# Patient Record
Sex: Male | Born: 1935
Health system: Southern US, Community
[De-identification: ages and names within clinical notes are randomized; demographics above are authoritative.]

## PROBLEM LIST (undated history)

## (undated) DIAGNOSIS — Z9889 Other specified postprocedural states: Secondary | ICD-10-CM

## (undated) DIAGNOSIS — N200 Calculus of kidney: Secondary | ICD-10-CM

## (undated) DIAGNOSIS — E785 Hyperlipidemia, unspecified: Secondary | ICD-10-CM

## (undated) DIAGNOSIS — L120 Bullous pemphigoid: Secondary | ICD-10-CM

## (undated) DIAGNOSIS — I10 Essential (primary) hypertension: Secondary | ICD-10-CM

## (undated) DIAGNOSIS — I255 Ischemic cardiomyopathy: Secondary | ICD-10-CM

## (undated) DIAGNOSIS — C801 Malignant (primary) neoplasm, unspecified: Secondary | ICD-10-CM

## (undated) DIAGNOSIS — I2510 Atherosclerotic heart disease of native coronary artery without angina pectoris: Secondary | ICD-10-CM

## (undated) HISTORY — DX: Ischemic cardiomyopathy: I25.5

## (undated) HISTORY — DX: Bullous pemphigoid: L12.0

## (undated) HISTORY — DX: Atherosclerotic heart disease of native coronary artery without angina pectoris: I25.10

## (undated) HISTORY — DX: Other specified postprocedural states: Z98.890

## (undated) HISTORY — PX: OTHER SURGICAL HISTORY: SHX169

## (undated) HISTORY — PX: EP IMPLANTABLE DEVICE: SHX172B

## (undated) HISTORY — DX: Hyperlipidemia, unspecified: E78.5

## (undated) HISTORY — DX: Calculus of kidney: N20.0

## (undated) HISTORY — DX: Essential (primary) hypertension: I10

## (undated) HISTORY — DX: Malignant (primary) neoplasm, unspecified: C80.1

---

## 1968-12-15 HISTORY — PX: OTHER SURGICAL HISTORY: SHX169

## 1978-12-15 HISTORY — PX: HERNIA REPAIR: SHX51

## 1992-12-15 HISTORY — PX: OTHER SURGICAL HISTORY: SHX169

## 2004-12-15 HISTORY — PX: OTHER SURGICAL HISTORY: SHX169

## 2011-01-12 ENCOUNTER — Emergency Department: Payer: Self-pay | Admitting: Emergency Medicine

## 2011-05-19 ENCOUNTER — Ambulatory Visit: Payer: Self-pay

## 2014-12-12 LAB — LIPID PANEL
CHOLESTEROL: 162 mg/dL (ref 0–200)
HDL: 51 mg/dL (ref 35–70)
LDL Cholesterol: 95 mg/dL
Triglycerides: 80 mg/dL (ref 40–160)

## 2015-01-12 DIAGNOSIS — R945 Abnormal results of liver function studies: Secondary | ICD-10-CM | POA: Diagnosis not present

## 2015-01-12 LAB — HEPATIC FUNCTION PANEL
ALK PHOS: 93 U/L (ref 25–125)
ALT: 63 U/L — AB (ref 10–40)
AST: 29 U/L (ref 14–40)
Bilirubin, Total: 0.4 mg/dL

## 2015-02-01 DIAGNOSIS — R350 Frequency of micturition: Secondary | ICD-10-CM | POA: Diagnosis not present

## 2015-02-01 DIAGNOSIS — R319 Hematuria, unspecified: Secondary | ICD-10-CM | POA: Diagnosis not present

## 2015-02-01 LAB — BASIC METABOLIC PANEL
BUN: 13 mg/dL (ref 4–21)
Creatinine: 1 mg/dL (ref 0.6–1.3)
POTASSIUM: 5.2 mmol/L (ref 3.4–5.3)
Sodium: 142 mmol/L (ref 137–147)

## 2015-02-01 LAB — CBC AND DIFFERENTIAL
HEMATOCRIT: 45 % (ref 41–53)
Hemoglobin: 15.2 g/dL (ref 13.5–17.5)
NEUTROS ABS: 65 /uL
Platelets: 275 10*3/uL (ref 150–399)
WBC: 10.1 10^3/mL

## 2015-02-01 LAB — PSA: PSA: 2.8

## 2015-02-07 ENCOUNTER — Ambulatory Visit: Payer: Self-pay | Admitting: Family Medicine

## 2015-02-07 DIAGNOSIS — N200 Calculus of kidney: Secondary | ICD-10-CM | POA: Diagnosis not present

## 2015-02-07 DIAGNOSIS — N329 Bladder disorder, unspecified: Secondary | ICD-10-CM | POA: Diagnosis not present

## 2015-02-07 DIAGNOSIS — R310 Gross hematuria: Secondary | ICD-10-CM | POA: Diagnosis not present

## 2015-02-07 DIAGNOSIS — N400 Enlarged prostate without lower urinary tract symptoms: Secondary | ICD-10-CM | POA: Diagnosis not present

## 2015-02-23 DIAGNOSIS — L409 Psoriasis, unspecified: Secondary | ICD-10-CM | POA: Diagnosis not present

## 2015-02-23 DIAGNOSIS — R350 Frequency of micturition: Secondary | ICD-10-CM | POA: Diagnosis not present

## 2015-03-06 DIAGNOSIS — R319 Hematuria, unspecified: Secondary | ICD-10-CM | POA: Diagnosis not present

## 2015-03-16 ENCOUNTER — Ambulatory Visit
Admit: 2015-03-16 | Disposition: A | Discharge status: Home / Self Care | Payer: Self-pay | Attending: Urology | Admitting: Urology

## 2015-03-16 DIAGNOSIS — N200 Calculus of kidney: Secondary | ICD-10-CM | POA: Diagnosis not present

## 2015-03-16 DIAGNOSIS — R319 Hematuria, unspecified: Secondary | ICD-10-CM | POA: Diagnosis not present

## 2015-03-16 DIAGNOSIS — K5730 Diverticulosis of large intestine without perforation or abscess without bleeding: Secondary | ICD-10-CM | POA: Diagnosis not present

## 2015-03-16 DIAGNOSIS — K5790 Diverticulosis of intestine, part unspecified, without perforation or abscess without bleeding: Secondary | ICD-10-CM | POA: Diagnosis not present

## 2015-03-19 DIAGNOSIS — R310 Gross hematuria: Secondary | ICD-10-CM | POA: Diagnosis not present

## 2015-03-19 DIAGNOSIS — N3289 Other specified disorders of bladder: Secondary | ICD-10-CM | POA: Diagnosis not present

## 2015-03-19 DIAGNOSIS — R319 Hematuria, unspecified: Secondary | ICD-10-CM | POA: Diagnosis not present

## 2015-03-19 DIAGNOSIS — N200 Calculus of kidney: Secondary | ICD-10-CM | POA: Diagnosis not present

## 2015-03-27 ENCOUNTER — Ambulatory Visit
Admit: 2015-03-27 | Disposition: A | Discharge status: Home / Self Care | Payer: Self-pay | Attending: Urology | Admitting: Urology

## 2015-03-27 DIAGNOSIS — I10 Essential (primary) hypertension: Secondary | ICD-10-CM | POA: Insufficient documentation

## 2015-03-27 DIAGNOSIS — R0681 Apnea, not elsewhere classified: Secondary | ICD-10-CM | POA: Insufficient documentation

## 2015-03-27 DIAGNOSIS — R0602 Shortness of breath: Secondary | ICD-10-CM | POA: Diagnosis not present

## 2015-03-27 DIAGNOSIS — E782 Mixed hyperlipidemia: Secondary | ICD-10-CM | POA: Diagnosis not present

## 2015-03-27 DIAGNOSIS — Z01812 Encounter for preprocedural laboratory examination: Secondary | ICD-10-CM | POA: Diagnosis not present

## 2015-03-27 DIAGNOSIS — Z01810 Encounter for preprocedural cardiovascular examination: Secondary | ICD-10-CM | POA: Diagnosis not present

## 2015-03-27 DIAGNOSIS — Z955 Presence of coronary angioplasty implant and graft: Secondary | ICD-10-CM | POA: Diagnosis not present

## 2015-03-27 DIAGNOSIS — N3289 Other specified disorders of bladder: Secondary | ICD-10-CM | POA: Diagnosis not present

## 2015-03-27 DIAGNOSIS — I25119 Atherosclerotic heart disease of native coronary artery with unspecified angina pectoris: Secondary | ICD-10-CM | POA: Diagnosis not present

## 2015-03-27 LAB — CBC
HCT: 46.3 % (ref 40.0–52.0)
HGB: 15 g/dL (ref 13.0–18.0)
MCH: 28 pg (ref 26.0–34.0)
MCHC: 32.3 g/dL (ref 32.0–36.0)
MCV: 87 fL (ref 80–100)
Platelet: 243 10*3/uL (ref 150–440)
RBC: 5.35 10*6/uL (ref 4.40–5.90)
RDW: 14.2 % (ref 11.5–14.5)
WBC: 10 10*3/uL (ref 3.8–10.6)

## 2015-03-27 LAB — BASIC METABOLIC PANEL
Anion Gap: 5 — ABNORMAL LOW (ref 7–16)
BUN: 19 mg/dL
CALCIUM: 10.1 mg/dL
CHLORIDE: 108 mmol/L
Co2: 28 mmol/L
Creatinine: 1.12 mg/dL
Glucose: 118 mg/dL — ABNORMAL HIGH
POTASSIUM: 4 mmol/L
Sodium: 141 mmol/L

## 2015-04-05 DIAGNOSIS — I25119 Atherosclerotic heart disease of native coronary artery with unspecified angina pectoris: Secondary | ICD-10-CM | POA: Diagnosis not present

## 2015-04-05 DIAGNOSIS — I2510 Atherosclerotic heart disease of native coronary artery without angina pectoris: Secondary | ICD-10-CM | POA: Diagnosis not present

## 2015-04-05 DIAGNOSIS — E782 Mixed hyperlipidemia: Secondary | ICD-10-CM | POA: Diagnosis not present

## 2015-04-05 DIAGNOSIS — I10 Essential (primary) hypertension: Secondary | ICD-10-CM | POA: Diagnosis not present

## 2015-04-05 DIAGNOSIS — R0602 Shortness of breath: Secondary | ICD-10-CM | POA: Diagnosis not present

## 2015-04-09 ENCOUNTER — Ambulatory Visit
Admit: 2015-04-09 | Disposition: A | Discharge status: Home / Self Care | Payer: Self-pay | Attending: Urology | Admitting: Urology

## 2015-04-09 DIAGNOSIS — I2510 Atherosclerotic heart disease of native coronary artery without angina pectoris: Secondary | ICD-10-CM | POA: Diagnosis not present

## 2015-04-09 DIAGNOSIS — F17200 Nicotine dependence, unspecified, uncomplicated: Secondary | ICD-10-CM | POA: Diagnosis not present

## 2015-04-09 DIAGNOSIS — K30 Functional dyspepsia: Secondary | ICD-10-CM | POA: Diagnosis not present

## 2015-04-09 DIAGNOSIS — E785 Hyperlipidemia, unspecified: Secondary | ICD-10-CM | POA: Diagnosis not present

## 2015-04-09 DIAGNOSIS — C679 Malignant neoplasm of bladder, unspecified: Secondary | ICD-10-CM | POA: Diagnosis not present

## 2015-04-09 DIAGNOSIS — Z87442 Personal history of urinary calculi: Secondary | ICD-10-CM | POA: Diagnosis not present

## 2015-04-09 DIAGNOSIS — I10 Essential (primary) hypertension: Secondary | ICD-10-CM | POA: Diagnosis not present

## 2015-04-09 DIAGNOSIS — C675 Malignant neoplasm of bladder neck: Secondary | ICD-10-CM | POA: Diagnosis not present

## 2015-04-09 DIAGNOSIS — Z955 Presence of coronary angioplasty implant and graft: Secondary | ICD-10-CM | POA: Diagnosis not present

## 2015-04-09 DIAGNOSIS — C672 Malignant neoplasm of lateral wall of bladder: Secondary | ICD-10-CM | POA: Diagnosis not present

## 2015-04-12 LAB — SURGICAL PATHOLOGY

## 2015-04-15 NOTE — Op Note (Signed)
PATIENT NAME:  Terry [name], Terry [name] MR#:  [id number] DATE OF BIRTH:  1936/01/30  DATE OF PROCEDURE:  04/09/2015  PREOPERATIVE DIAGNOSIS: Bladder tumor.  POSTOPERATIVE DIAGNOSIS: Bladder tumor, medium-sized tumor, 2 to 2.5 cm.   PROCEDURE: Cystoscopy with resection of 2 medium-sized bladder tumors.   SURGEON: Rick Duff, MD  ANESTHESIA: General.   SPECIMENS SENT: Yes.  DESCRIPTION OF PROCEDURE: With the patient sterilely prepped and draped in the supine lithotomy position, after an appropriate timeout, I view the bladder with the cystoscope. The tumor is too large to take out with biopsy forceps so I have to resect them with a saline resectoscope. So with normal saline irrigation, using bipolar saline resectoscope, I resect the bladder tumors. The one on the right lateral wall is the larger of the two and there is some obturator spasm with penetration through the muscle into the fat. It is resected easily. Bleeding is controlled easily with cautery. Then, there is 1 at the right side of the bladder neck, just in front of the ureteral orifice. This is resected easily. They appear to be superficial, although the resection goes to the muscle. He tolerates it well and once the fragments are evacuated with suction evacuation, and there is no more bleeding, I put a Foley in because of the perforation of bladder wall with the obturator spasm. So I then irrigate and it is clear urine completely throughout the irrigation. Foley will be placed to gravity. He will go home with a bag to gravity. The rectal exam is done at the end of the procedure after I place a B and O suppository in the rectum. There is no fixation of the pelvis and the prostate is small on rectal exam. Internally, the prostate had trilobar hypertrophy, but not obstructive in nature. There was minimal trabeculation in the bladder. The ureters were in normal position. Then 30 mL of 0.5% plain Marcaine is in the bladder at the end of the procedure to  help with the postoperative bladder spasms.  ____________________________ Janice Coffin. Elnoria Howard, DO rdh:sb D: 04/09/2015 10:49:10 ET T: 04/09/2015 11:20:35 ET JOB#: [id number]  cc: Janice Coffin. Elnoria Howard, DO, <Dictator> Tenelle Andreason D Arieon Corcoran DO ELECTRONICALLY SIGNED 04/09/2015 14:23

## 2015-04-24 DIAGNOSIS — I2510 Atherosclerotic heart disease of native coronary artery without angina pectoris: Secondary | ICD-10-CM | POA: Insufficient documentation

## 2015-04-24 DIAGNOSIS — K259 Gastric ulcer, unspecified as acute or chronic, without hemorrhage or perforation: Secondary | ICD-10-CM | POA: Insufficient documentation

## 2015-04-24 DIAGNOSIS — IMO0001 Reserved for inherently not codable concepts without codable children: Secondary | ICD-10-CM | POA: Insufficient documentation

## 2015-04-24 DIAGNOSIS — M79609 Pain in unspecified limb: Secondary | ICD-10-CM | POA: Insufficient documentation

## 2015-04-24 DIAGNOSIS — R945 Abnormal results of liver function studies: Secondary | ICD-10-CM | POA: Insufficient documentation

## 2015-04-24 DIAGNOSIS — N200 Calculus of kidney: Secondary | ICD-10-CM | POA: Insufficient documentation

## 2015-04-24 DIAGNOSIS — R030 Elevated blood-pressure reading, without diagnosis of hypertension: Secondary | ICD-10-CM

## 2015-04-24 DIAGNOSIS — I25110 Atherosclerotic heart disease of native coronary artery with unstable angina pectoris: Secondary | ICD-10-CM | POA: Insufficient documentation

## 2015-04-24 DIAGNOSIS — R7989 Other specified abnormal findings of blood chemistry: Secondary | ICD-10-CM | POA: Insufficient documentation

## 2015-04-24 DIAGNOSIS — L409 Psoriasis, unspecified: Secondary | ICD-10-CM | POA: Insufficient documentation

## 2015-04-24 DIAGNOSIS — E7800 Pure hypercholesterolemia, unspecified: Secondary | ICD-10-CM | POA: Insufficient documentation

## 2015-04-24 DIAGNOSIS — E785 Hyperlipidemia, unspecified: Secondary | ICD-10-CM | POA: Insufficient documentation

## 2015-04-26 DIAGNOSIS — C679 Malignant neoplasm of bladder, unspecified: Secondary | ICD-10-CM | POA: Diagnosis not present

## 2015-04-26 DIAGNOSIS — C672 Malignant neoplasm of lateral wall of bladder: Secondary | ICD-10-CM | POA: Diagnosis not present

## 2015-04-27 DIAGNOSIS — C679 Malignant neoplasm of bladder, unspecified: Secondary | ICD-10-CM | POA: Diagnosis not present

## 2015-05-04 DIAGNOSIS — C672 Malignant neoplasm of lateral wall of bladder: Secondary | ICD-10-CM | POA: Diagnosis not present

## 2015-05-14 ENCOUNTER — Inpatient Hospital Stay: Admission: RE | Admit: 2015-05-14 | Payer: Self-pay | Source: Ambulatory Visit

## 2015-05-15 ENCOUNTER — Inpatient Hospital Stay: Admission: RE | Admit: 2015-05-15 | Payer: Self-pay | Source: Ambulatory Visit

## 2015-05-17 DIAGNOSIS — C801 Malignant (primary) neoplasm, unspecified: Secondary | ICD-10-CM | POA: Diagnosis not present

## 2015-05-17 DIAGNOSIS — R310 Gross hematuria: Secondary | ICD-10-CM | POA: Diagnosis not present

## 2015-05-17 DIAGNOSIS — Z882 Allergy status to sulfonamides status: Secondary | ICD-10-CM | POA: Diagnosis not present

## 2015-05-17 DIAGNOSIS — I10 Essential (primary) hypertension: Secondary | ICD-10-CM | POA: Diagnosis not present

## 2015-05-17 DIAGNOSIS — Z955 Presence of coronary angioplasty implant and graft: Secondary | ICD-10-CM | POA: Diagnosis not present

## 2015-05-17 DIAGNOSIS — R0602 Shortness of breath: Secondary | ICD-10-CM | POA: Diagnosis not present

## 2015-05-17 DIAGNOSIS — K219 Gastro-esophageal reflux disease without esophagitis: Secondary | ICD-10-CM | POA: Diagnosis not present

## 2015-05-17 DIAGNOSIS — C675 Malignant neoplasm of bladder neck: Secondary | ICD-10-CM | POA: Diagnosis not present

## 2015-05-17 DIAGNOSIS — C679 Malignant neoplasm of bladder, unspecified: Secondary | ICD-10-CM | POA: Diagnosis not present

## 2015-05-17 DIAGNOSIS — I2510 Atherosclerotic heart disease of native coronary artery without angina pectoris: Secondary | ICD-10-CM | POA: Diagnosis not present

## 2015-05-21 ENCOUNTER — Ambulatory Visit: Admission: RE | Admit: 2015-05-21 | Payer: Self-pay | Source: Ambulatory Visit | Admitting: Urology

## 2015-05-21 ENCOUNTER — Encounter: Admission: RE | Payer: Self-pay | Source: Ambulatory Visit

## 2015-05-21 SURGERY — TRANSURETHRAL RESECTION OF BLADDER TUMOR WITH MITOMYCIN-C
Anesthesia: Choice

## 2015-06-15 HISTORY — PX: BLADDER SURGERY: SHX569

## 2015-06-19 DIAGNOSIS — M47819 Spondylosis without myelopathy or radiculopathy, site unspecified: Secondary | ICD-10-CM | POA: Diagnosis not present

## 2015-06-19 DIAGNOSIS — Z0183 Encounter for blood typing: Secondary | ICD-10-CM | POA: Diagnosis not present

## 2015-06-19 DIAGNOSIS — N200 Calculus of kidney: Secondary | ICD-10-CM | POA: Diagnosis not present

## 2015-06-19 DIAGNOSIS — K5790 Diverticulosis of intestine, part unspecified, without perforation or abscess without bleeding: Secondary | ICD-10-CM | POA: Diagnosis not present

## 2015-06-19 DIAGNOSIS — Z01818 Encounter for other preprocedural examination: Secondary | ICD-10-CM | POA: Diagnosis not present

## 2015-06-19 DIAGNOSIS — K5730 Diverticulosis of large intestine without perforation or abscess without bleeding: Secondary | ICD-10-CM | POA: Diagnosis not present

## 2015-06-19 DIAGNOSIS — I10 Essential (primary) hypertension: Secondary | ICD-10-CM | POA: Diagnosis not present

## 2015-06-19 DIAGNOSIS — I2510 Atherosclerotic heart disease of native coronary artery without angina pectoris: Secondary | ICD-10-CM | POA: Diagnosis not present

## 2015-06-19 DIAGNOSIS — R1900 Intra-abdominal and pelvic swelling, mass and lump, unspecified site: Secondary | ICD-10-CM | POA: Diagnosis not present

## 2015-06-19 DIAGNOSIS — C679 Malignant neoplasm of bladder, unspecified: Secondary | ICD-10-CM | POA: Diagnosis not present

## 2015-06-21 ENCOUNTER — Encounter: Payer: Self-pay | Admitting: Family Medicine

## 2015-06-21 ENCOUNTER — Ambulatory Visit (INDEPENDENT_AMBULATORY_CARE_PROVIDER_SITE_OTHER): Payer: Commercial Managed Care - HMO | Admitting: Family Medicine

## 2015-06-21 VITALS — BP 138/72 | HR 70 | Temp 98.2°F | Resp 16 | Ht 67.0 in | Wt 151.2 lb

## 2015-06-21 DIAGNOSIS — C679 Malignant neoplasm of bladder, unspecified: Secondary | ICD-10-CM | POA: Insufficient documentation

## 2015-06-21 DIAGNOSIS — Z0000 Encounter for general adult medical examination without abnormal findings: Secondary | ICD-10-CM

## 2015-06-21 DIAGNOSIS — E785 Hyperlipidemia, unspecified: Secondary | ICD-10-CM | POA: Diagnosis not present

## 2015-06-21 LAB — POCT URINALYSIS DIPSTICK
Bilirubin, UA: NEGATIVE
Glucose, UA: NEGATIVE
Ketones, UA: NEGATIVE
LEUKOCYTES UA: NEGATIVE
NITRITE UA: NEGATIVE
PH UA: 7.5
PROTEIN UA: NEGATIVE
Spec Grav, UA: 1.01
Urobilinogen, UA: 0.2

## 2015-06-21 MED ORDER — PRAVASTATIN SODIUM 20 MG PO TABS: 20.0000 mg | ORAL_TABLET | Freq: Every day | ORAL | Status: DC

## 2015-06-21 NOTE — Progress Notes (Signed)
Patient: Terry [name], Male    DOB: May 29, 1936, 79 y.o.   MRN: [id number] Visit Date: 06/21/2015  Today's Provider: [name], PA   Chief Complaint  Patient presents with  . Annual Exam   Subjective:    Annual wellness visit Ceylon Arenson is a 79 y.o. male who presents today for his Subsequent Annual Wellness Visit. He feels fairly well despite diagnosis of high grade T1 papillary urothelial carcinoma of the bladder recently. Plans to have bladder and prostate removed 07-03-15 in [facility]. He reports exercising by playing softball frequently.Marland Kitchen He reports he is sleeping fairly well (sleep 5-6 hours a night and rarely use Tylenol-PM).  -----------------------------------------------------------   Review of Systems  Constitutional: Negative.   HENT: Negative.   Eyes: Negative.   Respiratory: Negative.   Cardiovascular: Negative.   Gastrointestinal: Negative.   Endocrine: Negative.   Genitourinary: Negative.   Neurological: Negative.     History   Social History  . Marital Status: Married    Spouse Name: N/A  . Number of Children: N/A  . Years of Education: N/A   Occupational History  . Not on file.   Social History Main Topics  . Smoking status: Former Smoker -- 1.50 packs/day for 50 years    Types: Cigarettes    Quit date: 06/18/2005  . Smokeless tobacco: Not on file  . Alcohol Use: No  . Drug Use: No  . Sexual Activity: Not on file   Other Topics Concern  . Not on file   Social History Narrative    Patient Active Problem List   Diagnosis Date Noted  . Cancer of lateral wall of urinary bladder 04/26/2015  . Arteriosclerosis of coronary artery 04/24/2015  . Blood pressure elevated 04/24/2015  . HLD (hyperlipidemia) 04/24/2015  . Calculus of kidney 04/24/2015  . Psoriasis 04/24/2015  . Gastric ulcer 04/24/2015  . Pain in soft tissues of limb 04/24/2015  . Abnormal LFTs 04/24/2015  . Benign essential HTN 03/27/2015  .  Combined fat and carbohydrate induced hyperlipemia 03/27/2015  . Breathlessness on exertion 03/27/2015    Past Surgical History  Procedure Laterality Date  . Hernia repair  1980  . Stomach ulcer surgery  1994  . Deviated nose septum surgery  1970  . Vascular stent  2006    His family history includes Alzheimer's disease in his father; Diabetes in his mother; Heart disease in his mother; Hip fracture in his father; Hypertension in his father; Prostate cancer in his paternal uncle.    Patient Active Problem List   Diagnosis Date Noted  . Cancer of lateral wall of urinary bladder 04/26/2015  . Arteriosclerosis of coronary artery 04/24/2015  . Blood pressure elevated 04/24/2015  . HLD (hyperlipidemia) 04/24/2015  . Calculus of kidney 04/24/2015  . Psoriasis 04/24/2015  . Gastric ulcer 04/24/2015  . Pain in soft tissues of limb 04/24/2015  . Abnormal LFTs 04/24/2015  . Benign essential HTN 03/27/2015  . Combined fat and carbohydrate induced hyperlipemia 03/27/2015  . Breathlessness on exertion 03/27/2015   Previous Medications   ASPIRIN 81 MG TABLET    Take 1 tablet by mouth daily.   PRAVASTATIN (PRAVACHOL) 40 MG TABLET    Take 1 tablet by mouth daily.   Allergies  Allergen Reactions  . Sudafed Pe Cold-Cough  [Phenylephrine-Dm-Gg-Apap] Hives  . Actifed Cold-Allergy  [Chlorpheniramine-Phenylephrine] Rash   Patient Care Team: Margo Common, PA as PCP - General (Physician Assistant)     Objective:  Vitals: BP 138/72 mmHg  Pulse 70  Temp(Src) 98.2 F (36.8 C) (Oral)  Resp 16  Ht 5\' 7"  (1.702 m)  Wt 151 lb 3.2 oz (68.584 kg)  BMI 23.68 kg/m2  Physical Exam  Constitutional: He appears well-developed. He appears distressed.  HENT:  Head: Normocephalic and atraumatic.  Right Ear: External ear normal.  Left Ear: External ear normal.  Nose: Nose normal.  Mouth/Throat: Oropharynx is clear and moist.  Eyes: Conjunctivae and EOM are normal. Pupils are equal, round, and  reactive to light.  Neck: Normal range of motion.  Cardiovascular: Normal rate, regular rhythm, normal heart sounds and intact distal pulses.   Pulmonary/Chest: Effort normal and breath sounds normal.  Abdominal: Soft. Bowel sounds are normal.  Genitourinary:  Exam deferred to urologist planning surgery of bladder and prostate removal 07-03-15.  Musculoskeletal: Normal range of motion.  Neurological: He is alert. He has normal reflexes.  Skin: Skin is warm and dry.  Psychiatric: He has a normal mood and affect. His behavior is normal. Thought content normal.    Activities of Daily Living In your present state of health, do you have any difficulty performing the following activities: 06/21/2015  Hearing? Y  Vision? N  Difficulty concentrating or making decisions? N  Walking or climbing stairs? N  Dressing or bathing? N  Doing errands, shopping? N    Fall Risk Assessment Fall Risk  06/21/2015  Falls in the past year? No     Depression Screen PHQ 2/9 Scores 06/21/2015  PHQ - 2 Score 0    Cognitive Testing - 6-CIT  Correct? Score   What year is it? yes 0 0 or 4  What month is it? yes 0 0 or 3  Memorize:    Pia Mau,  44,  [address]  [place],      What time is it? (within 1 hour) yes 0 0 or 3  Count backwards from 20 yes 0 0, 2, or 4  Name the months of the year yes 0 0, 2, or 4  Repeat name & address above yes 0 0, 2, 4, 6, 8, or 10       TOTAL SCORE  0/28   Interpretation:  Normal  Normal (0-7) Abnormal (8-28)       Assessment & Plan:     Annual Wellness Visit  Reviewed patient's Family Medical History Reviewed and updated list of patient's medical providers Assessment of cognitive impairment was done Assessed patient's functional ability Established a written schedule for health screening [place] Completed and Reviewed  Exercise Activities and Dietary recommendations Goals    None      Immunization History  Administered Date(s)  Administered  . Tdap 02/01/2013    Health Maintenance  Topic Date Due  . COLONOSCOPY  07/30/1988  . ZOSTAVAX  07/30/1998  . PNA vac Low Risk Adult (1 of 2 - PCV13) 07/31/2003  . INFLUENZA VACCINE  07/16/2015  . TETANUS/TDAP  02/01/2023      Discussed health benefits of physical activity, and encouraged him to engage in regular exercise appropriate for his age and condition.    ------------------------------------------------------------------------------------------------------------  1. Medicare annual wellness visit, initial Good generally health with history of bladder cancer. Declines vaccinations and screening colonoscopy. Probably will be done through oncologist in relationship to bladder cancer and upcoming surgery.  2. HLD (hyperlipidemia) Tolerating Pravastatin without side effects. Continue low fat diet and recheck levels in 3 months. Continue present pravastatin dosage. - pravastatin (PRAVACHOL)  20 MG tablet; Take 1 tablet (20 mg total) by mouth daily.  Dispense: 90 tablet; Refill: 3  3. Malignant neoplasm of urinary bladder, unspecified site Recent diagnosis with plans for bladder and prostate removal and development of a reconstructed bladder from a portion of intestines. Some blood in urine under microscopic exam. - POCT urinalysis dipstick

## 2015-07-03 DIAGNOSIS — K560 Paralytic ileus: Secondary | ICD-10-CM | POA: Diagnosis not present

## 2015-07-03 DIAGNOSIS — Z452 Encounter for adjustment and management of vascular access device: Secondary | ICD-10-CM | POA: Diagnosis not present

## 2015-07-03 DIAGNOSIS — N132 Hydronephrosis with renal and ureteral calculous obstruction: Secondary | ICD-10-CM | POA: Diagnosis not present

## 2015-07-03 DIAGNOSIS — R918 Other nonspecific abnormal finding of lung field: Secondary | ICD-10-CM | POA: Diagnosis not present

## 2015-07-03 DIAGNOSIS — R188 Other ascites: Secondary | ICD-10-CM | POA: Diagnosis not present

## 2015-07-03 DIAGNOSIS — J90 Pleural effusion, not elsewhere classified: Secondary | ICD-10-CM | POA: Diagnosis not present

## 2015-07-03 DIAGNOSIS — I10 Essential (primary) hypertension: Secondary | ICD-10-CM | POA: Diagnosis not present

## 2015-07-03 DIAGNOSIS — Z888 Allergy status to other drugs, medicaments and biological substances status: Secondary | ICD-10-CM | POA: Diagnosis not present

## 2015-07-03 DIAGNOSIS — K5730 Diverticulosis of large intestine without perforation or abscess without bleeding: Secondary | ICD-10-CM | POA: Diagnosis not present

## 2015-07-03 DIAGNOSIS — Z9889 Other specified postprocedural states: Secondary | ICD-10-CM | POA: Diagnosis not present

## 2015-07-03 DIAGNOSIS — K6389 Other specified diseases of intestine: Secondary | ICD-10-CM | POA: Diagnosis not present

## 2015-07-03 DIAGNOSIS — I2510 Atherosclerotic heart disease of native coronary artery without angina pectoris: Secondary | ICD-10-CM | POA: Diagnosis not present

## 2015-07-03 DIAGNOSIS — J984 Other disorders of lung: Secondary | ICD-10-CM | POA: Diagnosis not present

## 2015-07-03 DIAGNOSIS — Z4682 Encounter for fitting and adjustment of non-vascular catheter: Secondary | ICD-10-CM | POA: Diagnosis not present

## 2015-07-03 DIAGNOSIS — Z466 Encounter for fitting and adjustment of urinary device: Secondary | ICD-10-CM | POA: Diagnosis not present

## 2015-07-03 DIAGNOSIS — C679 Malignant neoplasm of bladder, unspecified: Secondary | ICD-10-CM | POA: Diagnosis not present

## 2015-07-03 DIAGNOSIS — R339 Retention of urine, unspecified: Secondary | ICD-10-CM | POA: Diagnosis not present

## 2015-07-03 DIAGNOSIS — N390 Urinary tract infection, site not specified: Secondary | ICD-10-CM | POA: Diagnosis not present

## 2015-07-03 DIAGNOSIS — K5660 Unspecified intestinal obstruction: Secondary | ICD-10-CM | POA: Diagnosis not present

## 2015-07-03 DIAGNOSIS — N1330 Unspecified hydronephrosis: Secondary | ICD-10-CM | POA: Diagnosis not present

## 2015-07-03 DIAGNOSIS — D72829 Elevated white blood cell count, unspecified: Secondary | ICD-10-CM | POA: Diagnosis not present

## 2015-07-03 DIAGNOSIS — K913 Postprocedural intestinal obstruction: Secondary | ICD-10-CM | POA: Diagnosis not present

## 2015-07-03 DIAGNOSIS — Z4889 Encounter for other specified surgical aftercare: Secondary | ICD-10-CM | POA: Diagnosis not present

## 2015-07-03 DIAGNOSIS — E785 Hyperlipidemia, unspecified: Secondary | ICD-10-CM | POA: Diagnosis not present

## 2015-07-03 DIAGNOSIS — N2889 Other specified disorders of kidney and ureter: Secondary | ICD-10-CM | POA: Diagnosis not present

## 2015-08-07 DIAGNOSIS — Z936 Other artificial openings of urinary tract status: Secondary | ICD-10-CM | POA: Insufficient documentation

## 2015-08-07 DIAGNOSIS — Z9889 Other specified postprocedural states: Secondary | ICD-10-CM | POA: Insufficient documentation

## 2015-08-15 DIAGNOSIS — C679 Malignant neoplasm of bladder, unspecified: Secondary | ICD-10-CM | POA: Diagnosis not present

## 2015-08-15 DIAGNOSIS — N202 Calculus of kidney with calculus of ureter: Secondary | ICD-10-CM | POA: Diagnosis not present

## 2015-08-15 DIAGNOSIS — Z8551 Personal history of malignant neoplasm of bladder: Secondary | ICD-10-CM | POA: Diagnosis not present

## 2015-08-15 DIAGNOSIS — Z955 Presence of coronary angioplasty implant and graft: Secondary | ICD-10-CM | POA: Diagnosis not present

## 2015-08-15 DIAGNOSIS — E785 Hyperlipidemia, unspecified: Secondary | ICD-10-CM | POA: Diagnosis not present

## 2015-08-15 DIAGNOSIS — N132 Hydronephrosis with renal and ureteral calculous obstruction: Secondary | ICD-10-CM | POA: Diagnosis not present

## 2015-08-15 DIAGNOSIS — Z936 Other artificial openings of urinary tract status: Secondary | ICD-10-CM | POA: Diagnosis not present

## 2015-08-15 DIAGNOSIS — I2510 Atherosclerotic heart disease of native coronary artery without angina pectoris: Secondary | ICD-10-CM | POA: Diagnosis not present

## 2015-08-15 DIAGNOSIS — I10 Essential (primary) hypertension: Secondary | ICD-10-CM | POA: Diagnosis not present

## 2015-08-15 DIAGNOSIS — Z7982 Long term (current) use of aspirin: Secondary | ICD-10-CM | POA: Diagnosis not present

## 2015-08-22 DIAGNOSIS — N200 Calculus of kidney: Secondary | ICD-10-CM | POA: Diagnosis not present

## 2015-08-22 DIAGNOSIS — C679 Malignant neoplasm of bladder, unspecified: Secondary | ICD-10-CM | POA: Diagnosis not present

## 2015-08-27 DIAGNOSIS — Z8551 Personal history of malignant neoplasm of bladder: Secondary | ICD-10-CM | POA: Diagnosis not present

## 2015-08-27 DIAGNOSIS — Z936 Other artificial openings of urinary tract status: Secondary | ICD-10-CM | POA: Diagnosis not present

## 2015-09-03 DIAGNOSIS — N2889 Other specified disorders of kidney and ureter: Secondary | ICD-10-CM | POA: Diagnosis not present

## 2015-09-03 DIAGNOSIS — T83028A Displacement of other indwelling urethral catheter, initial encounter: Secondary | ICD-10-CM | POA: Diagnosis not present

## 2015-09-03 DIAGNOSIS — Z9889 Other specified postprocedural states: Secondary | ICD-10-CM | POA: Diagnosis not present

## 2015-09-03 DIAGNOSIS — E785 Hyperlipidemia, unspecified: Secondary | ICD-10-CM | POA: Diagnosis not present

## 2015-09-03 DIAGNOSIS — I2510 Atherosclerotic heart disease of native coronary artery without angina pectoris: Secondary | ICD-10-CM | POA: Diagnosis not present

## 2015-09-03 DIAGNOSIS — Z87891 Personal history of nicotine dependence: Secondary | ICD-10-CM | POA: Diagnosis not present

## 2015-09-03 DIAGNOSIS — I10 Essential (primary) hypertension: Secondary | ICD-10-CM | POA: Diagnosis not present

## 2015-09-03 DIAGNOSIS — N99528 Other complication of other external stoma of urinary tract: Secondary | ICD-10-CM | POA: Diagnosis not present

## 2015-09-12 DIAGNOSIS — C679 Malignant neoplasm of bladder, unspecified: Secondary | ICD-10-CM | POA: Diagnosis not present

## 2015-09-12 DIAGNOSIS — N135 Crossing vessel and stricture of ureter without hydronephrosis: Secondary | ICD-10-CM | POA: Diagnosis not present

## 2015-09-12 DIAGNOSIS — L409 Psoriasis, unspecified: Secondary | ICD-10-CM | POA: Diagnosis not present

## 2015-09-12 DIAGNOSIS — N202 Calculus of kidney with calculus of ureter: Secondary | ICD-10-CM | POA: Diagnosis not present

## 2015-09-12 DIAGNOSIS — N201 Calculus of ureter: Secondary | ICD-10-CM | POA: Diagnosis not present

## 2015-09-12 DIAGNOSIS — Z955 Presence of coronary angioplasty implant and graft: Secondary | ICD-10-CM | POA: Diagnosis not present

## 2015-09-12 DIAGNOSIS — Z87891 Personal history of nicotine dependence: Secondary | ICD-10-CM | POA: Diagnosis not present

## 2015-09-12 DIAGNOSIS — I10 Essential (primary) hypertension: Secondary | ICD-10-CM | POA: Diagnosis not present

## 2015-09-12 DIAGNOSIS — N131 Hydronephrosis with ureteral stricture, not elsewhere classified: Secondary | ICD-10-CM | POA: Diagnosis not present

## 2015-09-12 DIAGNOSIS — N132 Hydronephrosis with renal and ureteral calculous obstruction: Secondary | ICD-10-CM | POA: Diagnosis not present

## 2015-09-12 DIAGNOSIS — E785 Hyperlipidemia, unspecified: Secondary | ICD-10-CM | POA: Diagnosis not present

## 2015-09-12 DIAGNOSIS — I2510 Atherosclerotic heart disease of native coronary artery without angina pectoris: Secondary | ICD-10-CM | POA: Diagnosis not present

## 2015-09-24 DIAGNOSIS — Z936 Other artificial openings of urinary tract status: Secondary | ICD-10-CM | POA: Diagnosis not present

## 2015-09-24 DIAGNOSIS — N135 Crossing vessel and stricture of ureter without hydronephrosis: Secondary | ICD-10-CM | POA: Diagnosis not present

## 2015-09-24 DIAGNOSIS — Z8551 Personal history of malignant neoplasm of bladder: Secondary | ICD-10-CM | POA: Diagnosis not present

## 2015-09-24 DIAGNOSIS — C672 Malignant neoplasm of lateral wall of bladder: Secondary | ICD-10-CM | POA: Diagnosis not present

## 2015-10-05 ENCOUNTER — Ambulatory Visit (INDEPENDENT_AMBULATORY_CARE_PROVIDER_SITE_OTHER): Payer: Commercial Managed Care - HMO | Admitting: Family Medicine

## 2015-10-05 ENCOUNTER — Encounter: Payer: Self-pay | Admitting: Family Medicine

## 2015-10-05 ENCOUNTER — Telehealth: Payer: Self-pay

## 2015-10-05 VITALS — BP 124/62 | HR 89 | Temp 98.3°F | Resp 16 | Wt 145.2 lb

## 2015-10-05 DIAGNOSIS — C672 Malignant neoplasm of lateral wall of bladder: Secondary | ICD-10-CM | POA: Diagnosis not present

## 2015-10-05 LAB — POCT URINALYSIS DIPSTICK
BILIRUBIN UA: NEGATIVE
Glucose, UA: NEGATIVE
KETONES UA: NEGATIVE
Nitrite, UA: NEGATIVE
PH UA: 6.5
SPEC GRAV UA: 1.015
Urobilinogen, UA: 0.2

## 2015-10-05 NOTE — Progress Notes (Signed)
Subjective:    Patient ID: Terry [name], male    DOB: 1936/05/19, 79 y.o.   MRN: [id number] Chief Complaint  Patient presents with  . Follow-up    Patient comes in office today for nurse visit. Patient states that he was called by our office to come in for urinanalysis. Patient reports he is scheduled for surgery next week to remove kidney stone.     HPI  This 79 year old male was hospitalized July-August 2016 for 30 days due to malignant neoplasm of the urinary bladder with total cystectomy and prostatectomy. Is S/P ileal conduit and planning more surgery for problems with ureter attachment. Dr. Garnett Farm [facility] Urologist with Dr. Clydene Laming) requested a pre-op urine culture today so the result could be available before surgery on 10-10-15.  Patient Active Problem List   Diagnosis Date Noted  . Bladder cancer ([place]) 06/21/2015  . Cancer of lateral wall of urinary bladder ([place]) 04/26/2015  . Arteriosclerosis of coronary artery 04/24/2015  . Blood pressure elevated 04/24/2015  . HLD (hyperlipidemia) 04/24/2015  . Calculus of kidney 04/24/2015  . Psoriasis 04/24/2015  . Gastric ulcer 04/24/2015  . Pain in soft tissues of limb 04/24/2015  . Abnormal LFTs 04/24/2015  . Benign essential HTN 03/27/2015  . Combined fat and carbohydrate induced hyperlipemia 03/27/2015  . Breathlessness on exertion 03/27/2015   Past Surgical History  Procedure Laterality Date  . Hernia repair  1980  . Stomach ulcer surgery  1994  . Deviated nose septum surgery  1970  . Vascular stent  2006   Family History  Problem Relation Age of Onset  . Diabetes Mother   . Heart disease Mother   . Hypertension Father   . Alzheimer's disease Father   . Hip fracture Father   . Prostate cancer Paternal Uncle    Social History  Substance Use Topics  . Smoking status: Former Smoker -- 1.50 packs/day for 50 years    Types: Cigarettes    Quit date: 06/18/2005  . Smokeless tobacco: None  . Alcohol Use: No    Allergies  Allergen Reactions  . Sudafed Pe Cold-Cough  [Phenylephrine-Dm-Gg-Apap] Hives  . Actifed Cold-Allergy  [Chlorpheniramine-Phenylephrine] Rash   Current Outpatient Prescriptions on File Prior to Visit  Medication Sig Dispense Refill  . pravastatin (PRAVACHOL) 20 MG tablet Take 1 tablet (20 mg total) by mouth daily. 90 tablet 3   No current facility-administered medications on file prior to visit.   Review of Systems  Constitutional: Negative.   HENT: Negative.   Respiratory: Negative.   Cardiovascular: Negative.   Genitourinary:       Clear urine in ostomy bag without blood. Empty bag 1-2 times a day.      Objective:   Physical Exam  Constitutional: He appears well-developed and well-nourished.  HENT:  Head: Normocephalic.  Eyes: Conjunctivae are normal.  Neck: Neck supple.  Cardiovascular: Normal rate and regular rhythm.   Abdominal: Bowel sounds are normal.  Genitourinary:  Ileal conduit ostomy in right lower quadrant of abdomen. Healthy tissue without signs of infection. Urine in collection bag is clear yellow. Some soreness across lower abdomen since surgery for bladder neoplasm, total cystectomy and prostatectomy.      Assessment & Plan:  1. Malignant neoplasm of lateral wall of urinary bladder ([place]) To have another ileal conduit procedure on 10-10-15 and urologist requested a pre-op urine culture. Results will be available next week prior to surgery. Dr. Juliane Poot will call back for report of culture. - POCT  urinalysis dipstick - Urine Culture

## 2015-10-05 NOTE — Telephone Encounter (Signed)
Dr. Flonnie Overman from [facility] calling and states patient has a ilial procedure coming up (exploratory operatomy?) and he needs patient to get a urine culture done. He wanted to see if that can be done today where patient comes by and gives urine for this, he said it will have to come from his ilial condoment (?) bag. He said call him if has questions cell number is [phone]

## 2015-10-08 LAB — URINE CULTURE

## 2015-10-10 DIAGNOSIS — N1330 Unspecified hydronephrosis: Secondary | ICD-10-CM | POA: Diagnosis not present

## 2015-10-10 DIAGNOSIS — K567 Ileus, unspecified: Secondary | ICD-10-CM | POA: Diagnosis not present

## 2015-10-10 DIAGNOSIS — C679 Malignant neoplasm of bladder, unspecified: Secondary | ICD-10-CM | POA: Diagnosis not present

## 2015-10-10 DIAGNOSIS — Z8551 Personal history of malignant neoplasm of bladder: Secondary | ICD-10-CM | POA: Diagnosis not present

## 2015-10-10 DIAGNOSIS — Z936 Other artificial openings of urinary tract status: Secondary | ICD-10-CM | POA: Diagnosis not present

## 2015-10-10 DIAGNOSIS — K9189 Other postprocedural complications and disorders of digestive system: Secondary | ICD-10-CM | POA: Diagnosis not present

## 2015-10-10 DIAGNOSIS — Z9889 Other specified postprocedural states: Secondary | ICD-10-CM | POA: Diagnosis not present

## 2015-10-10 DIAGNOSIS — Z9079 Acquired absence of other genital organ(s): Secondary | ICD-10-CM | POA: Diagnosis not present

## 2015-10-10 DIAGNOSIS — Z79899 Other long term (current) drug therapy: Secondary | ICD-10-CM | POA: Diagnosis not present

## 2015-10-10 DIAGNOSIS — N99528 Other complication of other external stoma of urinary tract: Secondary | ICD-10-CM | POA: Diagnosis not present

## 2015-10-10 DIAGNOSIS — R197 Diarrhea, unspecified: Secondary | ICD-10-CM | POA: Diagnosis not present

## 2015-10-10 DIAGNOSIS — Z7982 Long term (current) use of aspirin: Secondary | ICD-10-CM | POA: Diagnosis not present

## 2015-10-10 DIAGNOSIS — K6389 Other specified diseases of intestine: Secondary | ICD-10-CM | POA: Diagnosis not present

## 2015-10-10 DIAGNOSIS — J9811 Atelectasis: Secondary | ICD-10-CM | POA: Diagnosis not present

## 2015-10-10 DIAGNOSIS — R918 Other nonspecific abnormal finding of lung field: Secondary | ICD-10-CM | POA: Diagnosis not present

## 2015-10-10 DIAGNOSIS — N131 Hydronephrosis with ureteral stricture, not elsewhere classified: Secondary | ICD-10-CM | POA: Diagnosis not present

## 2015-10-29 DIAGNOSIS — R110 Nausea: Secondary | ICD-10-CM | POA: Diagnosis not present

## 2015-10-29 DIAGNOSIS — R630 Anorexia: Secondary | ICD-10-CM | POA: Diagnosis not present

## 2015-10-29 DIAGNOSIS — Z79899 Other long term (current) drug therapy: Secondary | ICD-10-CM | POA: Diagnosis not present

## 2015-10-29 DIAGNOSIS — C679 Malignant neoplasm of bladder, unspecified: Secondary | ICD-10-CM | POA: Diagnosis not present

## 2015-10-29 DIAGNOSIS — R52 Pain, unspecified: Secondary | ICD-10-CM | POA: Diagnosis not present

## 2015-10-29 DIAGNOSIS — K5900 Constipation, unspecified: Secondary | ICD-10-CM | POA: Diagnosis not present

## 2015-10-30 ENCOUNTER — Telehealth: Payer: Self-pay | Admitting: Family Medicine

## 2015-10-30 NOTE — Telephone Encounter (Signed)
Contacted patient/ patient's daughter  Patient states he has followed up with his urologist this week. Patient wanted to know if it is necessary for him to follow up with you. Patient scheduled for a hospital follow up on 11/25 in case it is necessary. If not necessary patient's daughter states to leave a voicemail advising patient because he will not answer the phone.    Please advise.

## 2015-10-30 NOTE — Telephone Encounter (Signed)
Ann with [facility] called b/c she hasn't been able to get in touch with pt to encourage him to come in for a hospital f/u. Ann request that we try to reach out to the pt to see how he is and to schedule a f/u. Lelon Frohlich said pt was release from hospital on 10/19/15. Please advise if we should contact pt for appt. Thanks TNP

## 2015-11-01 NOTE — Telephone Encounter (Signed)
Left patient a voicemail advising him that he does need to keep hospital follow up appointment and per Simona Huh if he would like he could come in on Saturday.

## 2015-11-01 NOTE — Telephone Encounter (Signed)
Advise patient Medicare wants him to have a Transition of Care appointment. I will be in the office this Saturday 11-03-15 morning if he would like to come  Between 9-11 am.

## 2015-11-09 ENCOUNTER — Ambulatory Visit (INDEPENDENT_AMBULATORY_CARE_PROVIDER_SITE_OTHER): Payer: Commercial Managed Care - HMO | Admitting: Family Medicine

## 2015-11-09 ENCOUNTER — Encounter: Payer: Self-pay | Admitting: Family Medicine

## 2015-11-09 VITALS — BP 120/60 | HR 90 | Temp 98.2°F | Resp 16 | Wt 142.0 lb

## 2015-11-09 DIAGNOSIS — C672 Malignant neoplasm of lateral wall of bladder: Secondary | ICD-10-CM

## 2015-11-09 DIAGNOSIS — E785 Hyperlipidemia, unspecified: Secondary | ICD-10-CM

## 2015-11-09 DIAGNOSIS — N390 Urinary tract infection, site not specified: Secondary | ICD-10-CM

## 2015-11-09 LAB — POCT URINALYSIS DIPSTICK
BILIRUBIN UA: NEGATIVE
Glucose, UA: NEGATIVE
KETONES UA: NEGATIVE
Nitrite, UA: POSITIVE
PH UA: 6.5
SPEC GRAV UA: 1.01
Urobilinogen, UA: 0.2

## 2015-11-09 NOTE — Progress Notes (Signed)
Patient: Terry [name] [facility] Male    DOB: Jul 13, 1936   79 y.o.   MRN: [id number] Visit Date: 11/09/2015  Today's Provider: [name], PA   Chief Complaint  Patient presents with  . Hospitalization Follow-up   Subjective:    HPI   Follow up Hospitalization  Patient was admitted to [facility] on 10/10/2015 and discharged on 10/19/2015 for ureteroileal conduit with intestinal anastomosis by Dr. Clydene Laming at [facility] due to bladder cancer. Has follow up planned with ultrasound on 12-03-15. During this hospitalization, he has some ileus to develop and much improved with use of Colace.  He was treated for surgery on intestines. Treatment for this included; surgery; labs. He reports good compliance with treatment. He reports this condition is Improved.  ---------------------------------------------------------------------- Past Surgical History  Procedure Laterality Date  . Hernia repair  1980  . Stomach ulcer surgery  1994  . Deviated nose septum surgery  1970  . Vascular stent  2006   Patient Active Problem List   Diagnosis Date Noted  . Bladder cancer ([place]) 06/21/2015  . Cancer of lateral wall of urinary bladder ([place]) 04/26/2015  . Arteriosclerosis of coronary artery 04/24/2015  . Blood pressure elevated 04/24/2015  . HLD (hyperlipidemia) 04/24/2015  . Calculus of kidney 04/24/2015  . Psoriasis 04/24/2015  . Gastric ulcer 04/24/2015  . Pain in soft tissues of limb 04/24/2015  . Abnormal LFTs 04/24/2015  . Benign essential HTN 03/27/2015  . Combined fat and carbohydrate induced hyperlipemia 03/27/2015  . Breathlessness on exertion 03/27/2015   Family History  Problem Relation Age of Onset  . Diabetes Mother   . Heart disease Mother   . Hypertension Father   . Alzheimer's disease Father   . Hip fracture Father   . Prostate cancer Paternal Uncle    Allergies  Allergen Reactions  . Sudafed Pe Cold-Cough  [Phenylephrine-Dm-Gg-Apap] Hives  . Actifed Cold-Allergy   [Chlorpheniramine-Phenylephrine] Rash   Previous Medications   ASPIRIN 81 MG TABLET    Take 1 tablet by mouth daily.   PRAVASTATIN (PRAVACHOL) 20 MG TABLET    Take 1 tablet (20 mg total) by mouth daily.    Review of Systems  Constitutional: Negative.   HENT: Negative.   Eyes: Negative.   Respiratory: Negative.   Cardiovascular: Negative.  Negative for chest pain and palpitations.  Gastrointestinal: Negative.   Genitourinary:       Has ostomy in right lower quadrant from ureteroileal conduit.   Neurological: Negative.     Social History  Substance Use Topics  . Smoking status: Former Smoker -- 1.50 packs/day for 50 years    Types: Cigarettes    Quit date: 06/18/2005  . Smokeless tobacco: Not on file  . Alcohol Use: No   Objective:   BP 120/60 mmHg  Pulse 90  Temp(Src) 98.2 F (36.8 C) (Oral)  Resp 16  Wt 142 lb (64.411 kg)  SpO2 96%  Physical Exam  Constitutional: He is oriented to person, place, and time. He appears well-developed and well-nourished.  HENT:  Head: Normocephalic and atraumatic.  Right Ear: External ear normal.  Eyes: Conjunctivae and EOM are normal.  Neck: Normal range of motion. Neck supple.  Cardiovascular: Normal rate, regular rhythm and normal heart sounds.   Pulmonary/Chest: Effort normal and breath sounds normal.  Abdominal: Soft. Bowel sounds are normal.  Genitourinary:  Ureteroileal conduit ostomy in the RLQ of abdomen. Clear yellow urine in ostomy bag.  Neurological: He is alert and oriented to person,  place, and time.  Psychiatric: He has a normal mood and affect. His behavior is normal.      Assessment & Plan:     1. Malignant neoplasm of lateral wall of urinary bladder [facility]) Hospitalized for ureteroileal conduit repair secondary to bladder cancer. Has follow up with surgeon at [facility] on 12-03-15. Had slight ileus during the hospital stay 10-10-15 through 10-19-15. Feeling well today and normal bowel habits with use of Colace. Will recheck  labs and encouraged to follow up with surgeon next month.  - CBC with Differential/Platelet - POCT urinalysis dipstick  2. HLD (hyperlipidemia) Still on the Pravastatin without side effects. Will recheck labs and follow up pending reports. - COMPLETE METABOLIC PANEL WITH GFR - Lipid panel  3. Urinary tract infection without hematuria, site unspecified Asymptomatic. Urinalysis showed nitrites. Will get urine C&S. Was treated for a UTI prior to surgery. Increase fluids and recheck pending reports.      [name], PA  [facility]

## 2015-11-10 LAB — LIPID PANEL
CHOLESTEROL TOTAL: 176 mg/dL (ref 100–199)
Chol/HDL Ratio: 2.7 ratio units (ref 0.0–5.0)
HDL: 65 mg/dL (ref >39)
LDL Calculated: 96 mg/dL (ref 0–99)
TRIGLYCERIDES: 75 mg/dL (ref 0–149)
VLDL Cholesterol Cal: 15 mg/dL (ref 5–40)

## 2015-11-10 LAB — COMPREHENSIVE METABOLIC PANEL
A/G RATIO: 1.5 (ref 1.1–2.5)
ALT: 26 IU/L (ref 0–44)
AST: 23 IU/L (ref 0–40)
Albumin: 4.6 g/dL (ref 3.5–4.8)
Alkaline Phosphatase: 102 IU/L (ref 39–117)
BUN/Creatinine Ratio: 18 (ref 10–22)
BUN: 18 mg/dL (ref 8–27)
Bilirubin Total: 0.4 mg/dL (ref 0.0–1.2)
CALCIUM: 10.7 mg/dL — AB (ref 8.6–10.2)
CHLORIDE: 103 mmol/L (ref 97–106)
CO2: 23 mmol/L (ref 18–29)
CREATININE: 0.99 mg/dL (ref 0.76–1.27)
GFR, EST AFRICAN AMERICAN: 85 mL/min/{1.73_m2} (ref >59)
GFR, EST NON AFRICAN AMERICAN: 73 mL/min/{1.73_m2} (ref >59)
GLOBULIN, TOTAL: 3 g/dL (ref 1.5–4.5)
Glucose: 106 mg/dL — ABNORMAL HIGH (ref 65–99)
Potassium: 5.2 mmol/L (ref 3.5–5.2)
Sodium: 141 mmol/L (ref 136–144)
TOTAL PROTEIN: 7.6 g/dL (ref 6.0–8.5)

## 2015-11-10 LAB — CBC WITH DIFFERENTIAL/PLATELET
Basophils Absolute: 0 10*3/uL (ref 0.0–0.2)
Basos: 0 %
EOS (ABSOLUTE): 0.5 10*3/uL — AB (ref 0.0–0.4)
Eos: 4 %
Hematocrit: 40.4 % (ref 37.5–51.0)
Hemoglobin: 12.6 g/dL (ref 12.6–17.7)
Immature Grans (Abs): 0.1 10*3/uL (ref 0.0–0.1)
Immature Granulocytes: 1 %
LYMPHS: 19 %
Lymphocytes Absolute: 2 10*3/uL (ref 0.7–3.1)
MCH: 24.7 pg — ABNORMAL LOW (ref 26.6–33.0)
MCHC: 31.2 g/dL — AB (ref 31.5–35.7)
MCV: 79 fL (ref 79–97)
MONOS ABS: 1.1 10*3/uL — AB (ref 0.1–0.9)
Monocytes: 10 %
NEUTROS ABS: 7.1 10*3/uL — AB (ref 1.4–7.0)
Neutrophils: 66 %
Platelets: 314 10*3/uL (ref 150–379)
RBC: 5.11 x10E6/uL (ref 4.14–5.80)
RDW: 17.1 % — AB (ref 12.3–15.4)
WBC: 10.8 10*3/uL (ref 3.4–10.8)

## 2015-11-11 LAB — URINE CULTURE

## 2015-11-12 ENCOUNTER — Telehealth: Payer: Self-pay

## 2015-11-12 MED ORDER — CIPROFLOXACIN HCL 500 MG PO TABS: 500.0000 mg | ORAL_TABLET | Freq: Two times a day (BID) | ORAL | Status: DC

## 2015-11-12 NOTE — Telephone Encounter (Signed)
LMTCB

## 2015-11-12 NOTE — Telephone Encounter (Signed)
-----   Message from Margo Common, [place] sent at 11/12/2015  4:52 AM EST ----- Blood tests essentially normal. Some decrease in size of RBC's. Probably a decrease in iron and need a multivitamin with iron once a day to replenish after the recent surgery. Urine culture isolated a Klebsiella bacteria infection. Need Cipro 500 mg BID #20 and recheck urine in 10 days.

## 2015-11-12 NOTE — Telephone Encounter (Signed)
Patient advised as directed below. Patient verbalized understanding and agrees with plan of care. RX sent to pharmacy.  

## 2015-11-26 ENCOUNTER — Other Ambulatory Visit: Payer: Self-pay | Admitting: Family Medicine

## 2015-11-26 ENCOUNTER — Encounter: Payer: Self-pay | Admitting: Family Medicine

## 2015-11-26 ENCOUNTER — Other Ambulatory Visit: Payer: Self-pay

## 2015-11-26 ENCOUNTER — Ambulatory Visit (INDEPENDENT_AMBULATORY_CARE_PROVIDER_SITE_OTHER): Payer: Commercial Managed Care - HMO | Admitting: Family Medicine

## 2015-11-26 VITALS — BP 132/76 | HR 88 | Temp 97.7°F | Resp 14 | Wt 150.0 lb

## 2015-11-26 DIAGNOSIS — Z906 Acquired absence of other parts of urinary tract: Secondary | ICD-10-CM

## 2015-11-26 DIAGNOSIS — T83511D Infection and inflammatory reaction due to indwelling urethral catheter, subsequent encounter: Principal | ICD-10-CM

## 2015-11-26 DIAGNOSIS — Z9889 Other specified postprocedural states: Secondary | ICD-10-CM | POA: Diagnosis not present

## 2015-11-26 DIAGNOSIS — C672 Malignant neoplasm of lateral wall of bladder: Secondary | ICD-10-CM | POA: Diagnosis not present

## 2015-11-26 DIAGNOSIS — N390 Urinary tract infection, site not specified: Secondary | ICD-10-CM | POA: Diagnosis not present

## 2015-11-26 LAB — POCT URINALYSIS DIPSTICK
Bilirubin, UA: NEGATIVE
Glucose, UA: NEGATIVE
Ketones, UA: NEGATIVE
NITRITE UA: POSITIVE
SPEC GRAV UA: 1.015
UROBILINOGEN UA: 0.2
pH, UA: 7

## 2015-11-26 NOTE — Progress Notes (Signed)
Patient ID: Terry [name] [facility], male   DOB: June 28, 1936, 79 y.o.   MRN: [id number]   Patient: Terry [name] [facility] Male    DOB: 1935-12-27   79 y.o.   MRN: [id number] Visit Date: 11/26/2015  Today's Provider: [name], PA   Chief Complaint  Patient presents with  . Urinary Tract Infection  . Follow-up   Subjective:    Urinary Tract Infection  The current episode started 1 to 4 weeks ago. The problem has been resolved (Urine culture on 11-12-15 isolated Klebsiella sensitive to Cipro). The patient is experiencing no pain. There has been no fever. He has tried antibiotics for the symptoms. The treatment provided significant relief.  Finished all the antibiotic 5 days ago.   Previous Medications   ASPIRIN 81 MG TABLET    Take 1 tablet by mouth daily.   PRAVASTATIN (PRAVACHOL) 20 MG TABLET    Take 1 tablet (20 mg total) by mouth daily.   Allergies  Allergen Reactions  . Sudafed Pe Cold-Cough  [Phenylephrine-Dm-Gg-Apap] Hives  . Actifed Cold-Allergy  [Chlorpheniramine-Phenylephrine] Rash   Past Surgical History  Procedure Laterality Date  . Hernia repair  1980  . Stomach ulcer surgery  1994  . Deviated nose septum surgery  1970  . Vascular stent  2006   Patient Active Problem List   Diagnosis Date Noted  . History of total cystectomy 08/07/2015  . History of surgical procedure 08/07/2015  . Bladder cancer ([place]) 06/21/2015  . Cancer of lateral wall of urinary bladder ([place]) 04/26/2015  . Malignant neoplasm of lateral wall of urinary bladder ([place]) 04/26/2015  . Arteriosclerosis of coronary artery 04/24/2015  . Blood pressure elevated 04/24/2015  . HLD (hyperlipidemia) 04/24/2015  . Calculus of kidney 04/24/2015  . Psoriasis 04/24/2015  . Gastric ulcer 04/24/2015  . Pain in soft tissues of limb 04/24/2015  . Abnormal LFTs 04/24/2015  . Benign essential HTN 03/27/2015  . Combined fat and carbohydrate induced hyperlipemia 03/27/2015  . Breathlessness on exertion  03/27/2015   Family History  Problem Relation Age of Onset  . Diabetes Mother   . Heart disease Mother   . Hypertension Father   . Alzheimer's disease Father   . Hip fracture Father   . Prostate cancer Paternal Uncle     Review of Systems  Constitutional: Negative.   HENT: Negative.   Eyes: Negative.   Respiratory: Negative.   Cardiovascular: Negative.   Gastrointestinal: Negative.   Endocrine: Negative.   Genitourinary: Negative.   Musculoskeletal: Negative.   Skin: Negative.   Allergic/Immunologic: Negative.   Neurological: Negative.   Hematological: Negative.   Psychiatric/Behavioral: Negative.     Social History  Substance Use Topics  . Smoking status: Former Smoker -- 1.50 packs/day for 50 years    Types: Cigarettes    Quit date: 06/18/2005  . Smokeless tobacco: Not on file  . Alcohol Use: No   Objective:   BP 132/76 mmHg  Pulse 88  Temp(Src) 97.7 F (36.5 C) (Oral)  Resp 14  Wt 150 lb (68.04 kg)  SpO2 97%  Physical Exam  Constitutional: He is oriented to person, place, and time. He appears well-developed and well-nourished.  HENT:  Head: Normocephalic.  Eyes: Conjunctivae and EOM are normal.  Neck: Normal range of motion. Neck supple.  Cardiovascular: Normal rate, regular rhythm and normal heart sounds.   Pulmonary/Chest: Effort normal and breath sounds normal.  Abdominal: Bowel sounds are normal.  Urostomy in RLQ. No irritation, redness, pain or induration.  Neurological: He  is alert and oriented to person, place, and time.      Assessment & Plan:     1. Urinary tract infection associated with catheterization of urinary tract, subsequent encounter Feeling much improved since finishing the antibiotic. Urinalysis shows persistent positive nitrites, leukocytes and blood. Denies fever, cloudy urine in ostomy bag or discomfort. Will repeat urine C&S. Recheck pending report. - POCT urinalysis dipstick - Urine culture  2. Cancer of lateral wall of  urinary bladder ([place]) Had total prostatectomy and cystectomy October 2016. Will follow up with surgeon on 12-03-15.   3. History of total cystectomy Tolerating changing ostomy bags without irritation to skin. Recommend he use disposable gloves to maintain clean application and not contaminate ostomy.

## 2015-11-28 LAB — URINE CULTURE: Organism ID, Bacteria: NO GROWTH

## 2015-11-30 ENCOUNTER — Telehealth: Payer: Self-pay

## 2015-11-30 NOTE — Telephone Encounter (Signed)
Patient advised as directed below. Lab results printed at front desk for pick up.

## 2015-11-30 NOTE — Telephone Encounter (Signed)
-----   Message from Margo Common, [place] sent at 11/29/2015  4:51 PM EST ----- No bacterial growth on urine culture. Make copy available for his urology follow up next week. No need for additional antibiotics.

## 2015-12-03 DIAGNOSIS — N132 Hydronephrosis with renal and ureteral calculous obstruction: Secondary | ICD-10-CM | POA: Diagnosis not present

## 2015-12-03 DIAGNOSIS — C679 Malignant neoplasm of bladder, unspecified: Secondary | ICD-10-CM | POA: Diagnosis not present

## 2015-12-03 DIAGNOSIS — Z8551 Personal history of malignant neoplasm of bladder: Secondary | ICD-10-CM | POA: Diagnosis not present

## 2015-12-05 DIAGNOSIS — Z8551 Personal history of malignant neoplasm of bladder: Secondary | ICD-10-CM | POA: Diagnosis not present

## 2015-12-05 DIAGNOSIS — Z936 Other artificial openings of urinary tract status: Secondary | ICD-10-CM | POA: Diagnosis not present

## 2015-12-18 ENCOUNTER — Encounter: Payer: Self-pay | Admitting: Family Medicine

## 2015-12-18 ENCOUNTER — Other Ambulatory Visit: Payer: Self-pay | Admitting: Family Medicine

## 2015-12-18 ENCOUNTER — Ambulatory Visit (INDEPENDENT_AMBULATORY_CARE_PROVIDER_SITE_OTHER): Payer: Commercial Managed Care - HMO | Admitting: Family Medicine

## 2015-12-18 VITALS — BP 132/72 | HR 86 | Temp 97.4°F | Resp 14 | Wt 147.0 lb

## 2015-12-18 DIAGNOSIS — C672 Malignant neoplasm of lateral wall of bladder: Secondary | ICD-10-CM

## 2015-12-18 DIAGNOSIS — R319 Hematuria, unspecified: Secondary | ICD-10-CM | POA: Diagnosis not present

## 2015-12-18 LAB — POCT URINALYSIS DIPSTICK
Bilirubin, UA: NEGATIVE
GLUCOSE UA: NEGATIVE
Ketones, UA: NEGATIVE
NITRITE UA: POSITIVE
PH UA: 7.5
SPEC GRAV UA: 1.01
UROBILINOGEN UA: 0.2

## 2015-12-18 NOTE — Progress Notes (Signed)
Patient ID: Terry [name] [facility], male   DOB: November 16, 1936, 80 y.o.   MRN: [id number]   Patient: Terry [name] [facility] Male    DOB: [birth year]   80 y.o.   MRN: [id number] Visit Date: 12/18/2015  Today's Provider: [name], PA   No chief complaint on file.  Subjective:    Hematuria This is a new problem. The current episode started in the past 7 days (started noticing dark urine 12-14-15). The problem has been gradually worsening since onset. He is experiencing no pain. He describes his urine color as tea colored. Pertinent negatives include no chills, fever, nausea or vomiting. (History of prostatectomy and total cystectomy due to cancer of lateral wall of bladder at [facility] during 30 day hospitalization July-August 2016. Has an ileal conduit with ostomy bag in the RLQ of abdominal wall.)      Patient Active Problem List   Diagnosis Date Noted  . History of total cystectomy 08/07/2015  . History of surgical procedure 08/07/2015  . Bladder cancer ([place]) 06/21/2015  . Cancer of lateral wall of urinary bladder ([place]) 04/26/2015  . Malignant neoplasm of lateral wall of urinary bladder ([place]) 04/26/2015  . Arteriosclerosis of coronary artery 04/24/2015  . Blood pressure elevated 04/24/2015  . HLD (hyperlipidemia) 04/24/2015  . Calculus of kidney 04/24/2015  . Psoriasis 04/24/2015  . Gastric ulcer 04/24/2015  . Pain in soft tissues of limb 04/24/2015  . Abnormal LFTs 04/24/2015  . Benign essential HTN 03/27/2015  . Combined fat and carbohydrate induced hyperlipemia 03/27/2015  . Breathlessness on exertion 03/27/2015   Past Surgical History  Procedure Laterality Date  . Hernia repair  1980  . Stomach ulcer surgery  1994  . Deviated nose septum surgery  1970  . Vascular stent  2006   Family History  Problem Relation Age of Onset  . Diabetes Mother   . Heart disease Mother   . Hypertension Father   . Alzheimer's disease Father   . Hip fracture Father   . Prostate cancer Paternal  Uncle    Allergies  Allergen Reactions  . Sudafed Pe Cold-Cough  [Phenylephrine-Dm-Gg-Apap] Hives  . Actifed Cold-Allergy  [Chlorpheniramine-Phenylephrine] Rash   Previous Medications   ASPIRIN 81 MG TABLET    Take 1 tablet by mouth daily.   DOCUSATE SODIUM (COLACE) 100 MG CAPSULE    Take 100 mg by mouth as needed.    MULTIPLE VITAMIN (MULTI-VITAMINS) TABS    Take by mouth.   PRAVASTATIN (PRAVACHOL) 20 MG TABLET    Take 1 tablet (20 mg total) by mouth daily.   SENNA (SENOKOT) 8.6 MG TABLET    Take by mouth as needed.     Review of Systems  Constitutional: Negative.  Negative for fever and chills.  HENT: Negative.   Eyes: Negative.   Respiratory: Negative.   Cardiovascular: Negative.   Gastrointestinal: Negative.  Negative for nausea and vomiting.  Endocrine: Negative.   Genitourinary: Positive for hematuria.       Discolored urine   Musculoskeletal: Negative.   Skin: Negative.   Allergic/Immunologic: Negative.   Neurological: Negative.   Hematological: Negative.   Psychiatric/Behavioral: Negative.     Social History  Substance Use Topics  . Smoking status: Former Smoker -- 1.50 packs/day for 50 years    Types: Cigarettes    Quit date: 06/18/2005  . Smokeless tobacco: Not on file  . Alcohol Use: No   Objective:   BP 132/72 mmHg  Pulse 86  Temp(Src) 97.4 F (36.3 C) (Oral)  Resp 14  Wt 147 lb (66.679 kg)  Physical Exam  Constitutional: He is oriented to person, place, and time. He appears well-developed and well-nourished. No distress.  HENT:  Head: Normocephalic and atraumatic.  Right Ear: Hearing normal.  Left Ear: Hearing normal.  Nose: Nose normal.  Eyes: Conjunctivae and lids are normal. Right eye exhibits no discharge. Left eye exhibits no discharge. No scleral icterus.  Cardiovascular: Normal rate and regular rhythm.   Pulmonary/Chest: Effort normal. No respiratory distress.  Abdominal: Soft. Bowel sounds are normal.  Healthy ostomy in RLQ of  abdominal wall. Some cloudy urine in ostomy bag at the present. Changed to a new bag today.  Musculoskeletal: Normal range of motion.  Neurological: He is alert and oriented to person, place, and time.  Skin: Skin is intact. No lesion and no rash noted.  Psychiatric: He has a normal mood and affect. His speech is normal and behavior is normal. Thought content normal.      Assessment & Plan:     1. Hematuria Onset over the past 5 days. Some clumps in ostomy bag. Urinalysis shows great deal of blood and a few WBC's. Will get urine culture. He had an ultrasound follow up with Dr. Sherral Hammers [facility] Urologist) on 12-03-15 without concerning findings. Scheduled to see oncologist on 12-24-15 to plan further treatments. - POCT urinalysis dipstick - Urine culture  2. Malignant neoplasm of lateral wall of urinary bladder (HCC) Has ileal conduit with ostomy bag in RLQ of abdomen. Total cystectomy and prostatectomy in July-August 2016. Continue follow up with urologist and oncologist as planned.

## 2015-12-20 ENCOUNTER — Telehealth: Payer: Self-pay

## 2015-12-20 LAB — URINE CULTURE

## 2015-12-20 NOTE — Telephone Encounter (Signed)
-----   Message from Margo Common, [place] sent at 12/20/2015  2:04 PM EST ----- No significant bacteria on culture today. Need follow up with Dr. Sherral Hammers [facility] Urologist) and send copy of this report to him.

## 2015-12-20 NOTE — Telephone Encounter (Signed)
LMTCB

## 2015-12-21 NOTE — Telephone Encounter (Signed)
Pt is returning call.  [id number]

## 2015-12-21 NOTE — Telephone Encounter (Signed)
Patient advised as directed below. Patient verbalized understanding. Patient states his urine is clear now. Patient states he thinks he may have passed as kidney stone on Wednesday night. The stone as big as a bean was in his colostomy bag. Patient states he will call Dr. Sherral Hammers to schedule a follow up appointment.

## 2015-12-27 DIAGNOSIS — C672 Malignant neoplasm of lateral wall of bladder: Secondary | ICD-10-CM | POA: Diagnosis not present

## 2016-01-21 DIAGNOSIS — R319 Hematuria, unspecified: Secondary | ICD-10-CM | POA: Diagnosis not present

## 2016-01-21 DIAGNOSIS — C672 Malignant neoplasm of lateral wall of bladder: Secondary | ICD-10-CM | POA: Diagnosis not present

## 2016-01-22 DIAGNOSIS — Z936 Other artificial openings of urinary tract status: Secondary | ICD-10-CM | POA: Diagnosis not present

## 2016-01-24 DIAGNOSIS — Z8551 Personal history of malignant neoplasm of bladder: Secondary | ICD-10-CM | POA: Diagnosis not present

## 2016-01-24 DIAGNOSIS — Z936 Other artificial openings of urinary tract status: Secondary | ICD-10-CM | POA: Diagnosis not present

## 2016-01-28 DIAGNOSIS — N1330 Unspecified hydronephrosis: Secondary | ICD-10-CM | POA: Diagnosis not present

## 2016-01-28 DIAGNOSIS — C672 Malignant neoplasm of lateral wall of bladder: Secondary | ICD-10-CM | POA: Diagnosis not present

## 2016-01-28 DIAGNOSIS — C689 Malignant neoplasm of urinary organ, unspecified: Secondary | ICD-10-CM | POA: Diagnosis not present

## 2016-02-12 DIAGNOSIS — N200 Calculus of kidney: Secondary | ICD-10-CM | POA: Diagnosis not present

## 2016-02-20 DIAGNOSIS — N200 Calculus of kidney: Secondary | ICD-10-CM | POA: Diagnosis not present

## 2016-02-22 DIAGNOSIS — Z936 Other artificial openings of urinary tract status: Secondary | ICD-10-CM | POA: Diagnosis not present

## 2016-02-22 DIAGNOSIS — C7911 Secondary malignant neoplasm of bladder: Secondary | ICD-10-CM | POA: Diagnosis not present

## 2016-05-05 DIAGNOSIS — K5730 Diverticulosis of large intestine without perforation or abscess without bleeding: Secondary | ICD-10-CM | POA: Diagnosis not present

## 2016-05-05 DIAGNOSIS — C679 Malignant neoplasm of bladder, unspecified: Secondary | ICD-10-CM | POA: Diagnosis not present

## 2016-05-05 DIAGNOSIS — Z79899 Other long term (current) drug therapy: Secondary | ICD-10-CM | POA: Diagnosis not present

## 2016-05-05 DIAGNOSIS — R918 Other nonspecific abnormal finding of lung field: Secondary | ICD-10-CM | POA: Diagnosis not present

## 2016-05-05 DIAGNOSIS — K769 Liver disease, unspecified: Secondary | ICD-10-CM | POA: Diagnosis not present

## 2016-05-05 DIAGNOSIS — C672 Malignant neoplasm of lateral wall of bladder: Secondary | ICD-10-CM | POA: Diagnosis not present

## 2016-05-05 DIAGNOSIS — Z8551 Personal history of malignant neoplasm of bladder: Secondary | ICD-10-CM | POA: Diagnosis not present

## 2016-06-03 ENCOUNTER — Other Ambulatory Visit: Payer: Self-pay | Admitting: Family Medicine

## 2016-06-03 DIAGNOSIS — E785 Hyperlipidemia, unspecified: Secondary | ICD-10-CM

## 2016-06-03 MED ORDER — PRAVASTATIN SODIUM 20 MG PO TABS: 20.0000 mg | ORAL_TABLET | Freq: Every day | ORAL | Status: DC

## 2016-06-03 NOTE — Telephone Encounter (Signed)
Please review. Thanks!  

## 2016-06-03 NOTE — Telephone Encounter (Signed)
Pt states he's going to need a refill on the following medication, though [place].  Thanks [facility]  pravastatin (PRAVACHOL) 20 MG tablet

## 2016-06-05 DIAGNOSIS — C7911 Secondary malignant neoplasm of bladder: Secondary | ICD-10-CM | POA: Diagnosis not present

## 2016-06-05 DIAGNOSIS — Z936 Other artificial openings of urinary tract status: Secondary | ICD-10-CM | POA: Diagnosis not present

## 2016-06-30 DIAGNOSIS — C7911 Secondary malignant neoplasm of bladder: Secondary | ICD-10-CM | POA: Diagnosis not present

## 2016-06-30 DIAGNOSIS — Z936 Other artificial openings of urinary tract status: Secondary | ICD-10-CM | POA: Diagnosis not present

## 2016-07-03 ENCOUNTER — Ambulatory Visit (INDEPENDENT_AMBULATORY_CARE_PROVIDER_SITE_OTHER): Payer: Commercial Managed Care - HMO | Admitting: Family Medicine

## 2016-07-03 ENCOUNTER — Encounter: Payer: Self-pay | Admitting: Family Medicine

## 2016-07-03 VITALS — BP 124/70 | Temp 98.2°F | Resp 20 | Wt 152.0 lb

## 2016-07-03 DIAGNOSIS — Z0000 Encounter for general adult medical examination without abnormal findings: Secondary | ICD-10-CM | POA: Diagnosis not present

## 2016-07-03 DIAGNOSIS — E785 Hyperlipidemia, unspecified: Secondary | ICD-10-CM

## 2016-07-03 DIAGNOSIS — Z9889 Other specified postprocedural states: Secondary | ICD-10-CM

## 2016-07-03 DIAGNOSIS — C672 Malignant neoplasm of lateral wall of bladder: Secondary | ICD-10-CM | POA: Diagnosis not present

## 2016-07-03 DIAGNOSIS — Z906 Acquired absence of other parts of urinary tract: Secondary | ICD-10-CM

## 2016-07-03 NOTE — Progress Notes (Signed)
Patient: Terry [name], Male    DOB: [birth year], 80 y.o.   MRN: [id number] Visit Date: 07/03/2016  Today's Provider: [name], PA   Chief Complaint  Patient presents with  . Annual wellness exam   Subjective:    Annual wellness visit Terry [name] is a 80 y.o. male. He feels well. He reports exercising not regularly. He reports he is sleeping fairly well.   Tdap- 02/01/2013   Review of Systems  Constitutional: Negative.   HENT: Negative.   Eyes: Negative.   Respiratory: Negative.   Cardiovascular: Negative.   Gastrointestinal: Negative.   Endocrine: Negative.   Genitourinary: Negative.   Musculoskeletal: Negative.   Skin: Negative.   Allergic/Immunologic: Negative.   Neurological: Negative.   Hematological: Negative.   Psychiatric/Behavioral: Negative.     Social History   Social History  . Marital Status: Married    Spouse Name: N/A  . Number of Children: N/A  . Years of Education: N/A   Occupational History  . Not on file.   Social History Main Topics  . Smoking status: Former Smoker -- 1.50 packs/day for 50 years    Types: Cigarettes    Quit date: 06/18/2005  . Smokeless tobacco: Not on file  . Alcohol Use: No  . Drug Use: No  . Sexual Activity: Not on file   Other Topics Concern  . Not on file   Social History Narrative     Patient Active Problem List   Diagnosis Date Noted  . History of total cystectomy 08/07/2015  . History of surgical procedure 08/07/2015  . Bladder cancer ([place]) 06/21/2015  . Cancer of lateral wall of urinary bladder ([place]) 04/26/2015  . Malignant neoplasm of lateral wall of urinary bladder ([place]) 04/26/2015  . Arteriosclerosis of coronary artery 04/24/2015  . Blood pressure elevated 04/24/2015  . HLD (hyperlipidemia) 04/24/2015  . Calculus of kidney 04/24/2015  . Psoriasis 04/24/2015  . Gastric ulcer 04/24/2015  . Pain in soft tissues of limb 04/24/2015  . Abnormal LFTs 04/24/2015  .  Benign essential HTN 03/27/2015  . Combined fat and carbohydrate induced hyperlipemia 03/27/2015  . Breathlessness on exertion 03/27/2015    Past Surgical History  Procedure Laterality Date  . Hernia repair  1980  . Stomach ulcer surgery  1994  . Deviated nose septum surgery  1970  . Vascular stent  2006  . Bladder surgery  06/2015    His family history includes Alzheimer's disease in his father; Diabetes in his mother; Heart disease in his mother; Hip fracture in his father; Hypertension in his father; Prostate cancer in his paternal uncle.    Current Meds  Medication Sig  . aspirin 81 MG tablet Take 1 tablet by mouth daily.  Marland Kitchen docusate sodium (COLACE) 100 MG capsule Take 100 mg by mouth as needed.   . Multiple Vitamin (MULTI-VITAMINS) TABS Take by mouth.  . pravastatin (PRAVACHOL) 20 MG tablet Take 1 tablet (20 mg total) by mouth daily.  Marland Kitchen senna (SENOKOT) 8.6 MG tablet Take by mouth as needed.     Patient Care Team: Terry Common, PA as PCP - General (Physician Assistant)    Objective:   Vitals: BP 124/70 mmHg  Temp(Src) 98.2 F (36.8 C)  Resp 20  Wt 152 lb (68.947 kg)  Wt Readings from Last 3 Encounters:  07/03/16 152 lb (68.947 kg)  12/18/15 147 lb (66.679 kg)  11/26/15 150 lb (68.04 kg)   Physical Exam  Constitutional: He  is oriented to person, place, and time. He appears well-developed and well-nourished.  HENT:  Head: Normocephalic.  Right Ear: External ear normal.  Nose: Nose normal.  Mouth/Throat: Oropharynx is clear and moist.  Large amount of was in the left ear canal with diminished hearing.  Eyes: Conjunctivae and EOM are normal.  Neck: Normal range of motion. Neck supple. No thyromegaly present.  Cardiovascular: Normal rate, regular rhythm and normal heart sounds.   Pulmonary/Chest: Effort normal and breath sounds normal.  Abdominal: Soft. Bowel sounds are normal.  RLQ urostomy with clear yellow urine in collection bag.  Genitourinary: Penis  normal. Guaiac negative stool.  Musculoskeletal: He exhibits no edema.  Fair ROM through out. Some stiffness in lower back with soreness in right paravertebral muscles.  Lymphadenopathy:    He has no cervical adenopathy.  Neurological: He is alert and oriented to person, place, and time. He has normal reflexes.  Skin: Rash noted.  Eczema over sacrum.  Psychiatric: He has a normal mood and affect. His behavior is normal. Thought content normal.    Activities of Daily Living In your present state of health, do you have any difficulty performing the following activities: 07/03/2016  Hearing? Y  Vision? N  Difficulty concentrating or making decisions? N  Walking or climbing stairs? N  Dressing or bathing? N  Doing errands, shopping? N    Fall Risk Assessment Fall Risk  07/03/2016 06/21/2015  Falls in the past year? No No     Depression Screen PHQ 2/9 Scores 07/03/2016 06/21/2015  PHQ - 2 Score 0 0    Cognitive Testing - 6-CIT  Correct? Score   What year is it? yes 0 0 or 4  What month is it? yes 0 0 or 3  Memorize:    Terry [name],  44,  [address]  [place],      What time is it? (within 1 hour) yes 0 0 or 3  Count backwards from 20 yes 0 0, 2, or 4  Name the months of the year yes 0 0, 2, or 4  Repeat name & address above yes 0 0, 2, 4, 6, 8, or 10       TOTAL SCORE  0/28   Interpretation:  Normal  Normal (0-7) Abnormal (8-28)    Assessment & Plan:     Annual Wellness Visit  Reviewed patient's Family Medical History Reviewed and updated list of patient's medical providers Assessment of cognitive impairment was done Assessed patient's functional ability Established a written schedule for health screening [name] Completed and Reviewed  Exercise Activities and Dietary recommendations Goals    Play softball couple times a week 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      Immunization History  Administered Date(s) Administered  . Tdap  02/01/2013    Health Maintenance  Topic Date Due  . ZOSTAVAX  07/30/1998  . PNA vac Low Risk Adult (1 of 2 - PCV13) 07/31/2003  . INFLUENZA VACCINE  08/08/2016 (Originally 07/15/2016)  . TETANUS/TDAP  02/01/2023      Discussed health benefits of physical activity, and encouraged him to engage in regular exercise appropriate for his age and condition.    ------------------------------------------------------------------------------------------------------------ 1. Medicare annual wellness visit, subsequent General health stable. 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 States he had a pneumonia vaccination during his hospitalization in the fall of 2016 and last tetanus booster was given in 2014. With his history of  bladder cancer, he has frequent scans and oncology follow up.  2. Malignant neoplasm of lateral wall of urinary bladder ([place]) Hstory of gross hematuria with CT findings from March 16, 2015 concerning for multicentric urothelial neoplasm in the right side of the urinary bladder. Bladder tumor pathology showed high-grade T1 papillary urothelial carcinoma with micropapillary component. Had robotic assisted radical cystoprostatectomy with neobladder and pelvic lymph node dissection on 07-03-15. No new recurrences. Sees oncologist and urologist at [facility] every 3 months. Gets follow up scans and labs regularly. Feels the urostomy in the RLQ of abdomen is working well. No recent infections. Recheck labs. - CBC with Differential/Platelet  3. HLD (hyperlipidemia) Tolerating Pravastatin without side effects. Trying to restrict fats in diet and exercising regularly. Weight stable. Recheck labs. - Comprehensive metabolic panel - Lipid panel - TSH  4. History of total cystectomy Surgery in 2016 to correct defect in ileocystostomy in the RLQ.    Terry Murders, PA  [facility]

## 2016-07-04 LAB — CBC WITH DIFFERENTIAL/PLATELET
BASOS ABS: 0 10*3/uL (ref 0.0–0.2)
Basos: 0 %
EOS (ABSOLUTE): 0.2 10*3/uL (ref 0.0–0.4)
Eos: 2 %
Hematocrit: 43.8 % (ref 37.5–51.0)
Hemoglobin: 14.9 g/dL (ref 12.6–17.7)
IMMATURE GRANS (ABS): 0.1 10*3/uL (ref 0.0–0.1)
Immature Granulocytes: 1 %
LYMPHS: 14 %
Lymphocytes Absolute: 1.6 10*3/uL (ref 0.7–3.1)
MCH: 28.8 pg (ref 26.6–33.0)
MCHC: 34 g/dL (ref 31.5–35.7)
MCV: 85 fL (ref 79–97)
MONOS ABS: 0.8 10*3/uL (ref 0.1–0.9)
Monocytes: 7 %
NEUTROS PCT: 76 %
Neutrophils Absolute: 8.5 10*3/uL — ABNORMAL HIGH (ref 1.4–7.0)
PLATELETS: 293 10*3/uL (ref 150–379)
RBC: 5.18 x10E6/uL (ref 4.14–5.80)
RDW: 13.7 % (ref 12.3–15.4)
WBC: 11.1 10*3/uL — ABNORMAL HIGH (ref 3.4–10.8)

## 2016-07-04 LAB — COMPREHENSIVE METABOLIC PANEL
A/G RATIO: 1.6 (ref 1.2–2.2)
ALT: 19 IU/L (ref 0–44)
AST: 20 IU/L (ref 0–40)
Albumin: 4.3 g/dL (ref 3.5–4.8)
Alkaline Phosphatase: 95 IU/L (ref 39–117)
BILIRUBIN TOTAL: 0.4 mg/dL (ref 0.0–1.2)
BUN/Creatinine Ratio: 17 (ref 10–24)
BUN: 15 mg/dL (ref 8–27)
CHLORIDE: 104 mmol/L (ref 96–106)
CO2: 23 mmol/L (ref 18–29)
Calcium: 10 mg/dL (ref 8.6–10.2)
Creatinine, Ser: 0.87 mg/dL (ref 0.76–1.27)
GFR calc non Af Amer: 83 mL/min/{1.73_m2} (ref >59)
GFR, EST AFRICAN AMERICAN: 96 mL/min/{1.73_m2} (ref >59)
GLUCOSE: 98 mg/dL (ref 65–99)
Globulin, Total: 2.7 g/dL (ref 1.5–4.5)
Potassium: 5 mmol/L (ref 3.5–5.2)
Sodium: 142 mmol/L (ref 134–144)
TOTAL PROTEIN: 7 g/dL (ref 6.0–8.5)

## 2016-07-04 LAB — LIPID PANEL
Chol/HDL Ratio: 2.6 ratio units (ref 0.0–5.0)
Cholesterol, Total: 161 mg/dL (ref 100–199)
HDL: 63 mg/dL (ref >39)
LDL Calculated: 87 mg/dL (ref 0–99)
Triglycerides: 56 mg/dL (ref 0–149)
VLDL Cholesterol Cal: 11 mg/dL (ref 5–40)

## 2016-07-04 LAB — TSH: TSH: 2.11 u[IU]/mL (ref 0.450–4.500)

## 2016-07-14 ENCOUNTER — Telehealth: Payer: Self-pay | Admitting: Family Medicine

## 2016-07-14 NOTE — Telephone Encounter (Signed)
error 

## 2016-07-14 NOTE — Telephone Encounter (Signed)
Patient has been advised of lab results, he is requesting, that his Pravastatin be refilled and sent to his mail order pharmacy. KW

## 2016-07-15 ENCOUNTER — Other Ambulatory Visit: Payer: Self-pay | Admitting: Family Medicine

## 2016-07-15 DIAGNOSIS — E785 Hyperlipidemia, unspecified: Secondary | ICD-10-CM

## 2016-07-15 MED ORDER — PRAVASTATIN SODIUM 20 MG PO TABS: 20.0000 mg | ORAL_TABLET | Freq: Every day | ORAL | 3 refills | Status: DC

## 2016-07-17 ENCOUNTER — Other Ambulatory Visit: Payer: Self-pay | Admitting: Family Medicine

## 2016-07-17 DIAGNOSIS — E785 Hyperlipidemia, unspecified: Secondary | ICD-10-CM

## 2016-07-17 MED ORDER — PRAVASTATIN SODIUM 20 MG PO TABS: 20.0000 mg | ORAL_TABLET | Freq: Every day | ORAL | 3 refills | Status: DC

## 2016-08-11 DIAGNOSIS — N200 Calculus of kidney: Secondary | ICD-10-CM | POA: Diagnosis not present

## 2016-08-11 DIAGNOSIS — C689 Malignant neoplasm of urinary organ, unspecified: Secondary | ICD-10-CM | POA: Diagnosis not present

## 2016-08-11 DIAGNOSIS — R590 Localized enlarged lymph nodes: Secondary | ICD-10-CM | POA: Diagnosis not present

## 2016-08-11 DIAGNOSIS — C679 Malignant neoplasm of bladder, unspecified: Secondary | ICD-10-CM | POA: Diagnosis not present

## 2016-08-11 DIAGNOSIS — Z9079 Acquired absence of other genital organ(s): Secondary | ICD-10-CM | POA: Diagnosis not present

## 2016-08-11 DIAGNOSIS — K760 Fatty (change of) liver, not elsewhere classified: Secondary | ICD-10-CM | POA: Diagnosis not present

## 2016-08-11 DIAGNOSIS — Z906 Acquired absence of other parts of urinary tract: Secondary | ICD-10-CM | POA: Diagnosis not present

## 2016-08-11 DIAGNOSIS — C672 Malignant neoplasm of lateral wall of bladder: Secondary | ICD-10-CM | POA: Diagnosis not present

## 2016-08-11 DIAGNOSIS — N202 Calculus of kidney with calculus of ureter: Secondary | ICD-10-CM | POA: Diagnosis not present

## 2016-08-19 ENCOUNTER — Ambulatory Visit (INDEPENDENT_AMBULATORY_CARE_PROVIDER_SITE_OTHER): Payer: Commercial Managed Care - HMO | Admitting: Family Medicine

## 2016-08-19 ENCOUNTER — Encounter: Payer: Self-pay | Admitting: Family Medicine

## 2016-08-19 VITALS — BP 132/72 | HR 88 | Temp 98.2°F | Resp 14 | Wt 154.6 lb

## 2016-08-19 DIAGNOSIS — E875 Hyperkalemia: Secondary | ICD-10-CM | POA: Diagnosis not present

## 2016-08-19 DIAGNOSIS — C672 Malignant neoplasm of lateral wall of bladder: Secondary | ICD-10-CM

## 2016-08-19 NOTE — Progress Notes (Signed)
Patient: Terry [name] [facility] Male    DOB: 14-Oct-1936   80 y.o.   MRN: [id number] Visit Date: 08/19/2016  Today's Provider: [name], PA   Chief Complaint  Patient presents with  . Labs Only   Subjective:    HPI Patient is here today to follow up on labs. Patient reports he had a CT scan to follow up bladder cancer (Status post cystoprostatectomy and right lower quadrant urinary diversion. There is an enlarging 1.8 cm lymph node along the right pelvic sidewall between the right internal and external iliac veins, suspicious for metastatic disease. Additional retroperitoneal lymph nodes have slightly increased in size) and labs done (K+ 5.7 and Creatinine 0.94) last week on 08-11-16. Patient states he was advised to follow up with Simona Huh due to elevated potassium levels. Tests were done by Dr. Vito Berger (oncologist at [facility]).    History reviewed. No pertinent past medical history. Patient Active Problem List   Diagnosis Date Noted  . History of total cystectomy 08/07/2015  . History of surgical procedure 08/07/2015  . Bladder cancer ([place]) 06/21/2015  . Cancer of lateral wall of urinary bladder ([place]) 04/26/2015  . Malignant neoplasm of lateral wall of urinary bladder ([place]) 04/26/2015  . Arteriosclerosis of coronary artery 04/24/2015  . Blood pressure elevated 04/24/2015  . HLD (hyperlipidemia) 04/24/2015  . Calculus of kidney 04/24/2015  . Psoriasis 04/24/2015  . Gastric ulcer 04/24/2015  . Pain in soft tissues of limb 04/24/2015  . Abnormal LFTs 04/24/2015  . Benign essential HTN 03/27/2015  . Combined fat and carbohydrate induced hyperlipemia 03/27/2015  . Breathlessness on exertion 03/27/2015   Past Surgical History:  Procedure Laterality Date  . BLADDER SURGERY  06/2015  . deviated nose septum surgery  1970  . HERNIA REPAIR  1980  . stomach ulcer surgery  1994  . vascular stent  2006   Family History  Problem Relation Age of Onset  . Diabetes Mother   . Heart disease  Mother   . Hypertension Father   . Alzheimer's disease Father   . Hip fracture Father   . Prostate cancer Paternal Uncle    Allergies  Allergen Reactions  . Sudafed Pe Cold-Cough  [Phenylephrine-Dm-Gg-Apap] Hives  . Actifed Cold-Allergy  [Chlorpheniramine-Phenylephrine] Rash  . Triprolidine-Pseudoephedrine Rash   Previous Medications   ASPIRIN 81 MG TABLET    Take 1 tablet by mouth daily.   DIPHENHYDRAMINE-ACETAMINOPHEN (TYLENOL PM) 25-500 MG TABS TABLET    Take 1 tablet by mouth at bedtime as needed.   DOCUSATE SODIUM (COLACE) 100 MG CAPSULE    Take 100 mg by mouth as needed.    MULTIPLE VITAMIN (MULTI-VITAMINS) TABS    Take by mouth.   PRAVASTATIN (PRAVACHOL) 20 MG TABLET    Take 1 tablet (20 mg total) by mouth daily.   SENNA (SENOKOT) 8.6 MG TABLET    Take by mouth as needed.     Review of Systems  Constitutional: Negative.   Respiratory: Negative.   Cardiovascular: Negative.     Social History  Substance Use Topics  . Smoking status: Former Smoker    Packs/day: 1.50    Years: 50.00    Types: Cigarettes    Quit date: 06/18/2005  . Smokeless tobacco: Not on file  . Alcohol use No   Objective:   BP 132/72 (BP Location: Right Arm, Patient Position: Sitting, Cuff Size: Normal)   Pulse 88   Temp 98.2 F (36.8 C) (Oral)   Resp 14   Wt 154 lb  9.6 oz (70.1 kg)   SpO2 95%   BMI 24.21 kg/m  Wt Readings from Last 3 Encounters:  08/19/16 154 lb 9.6 oz (70.1 kg)  07/03/16 152 lb (68.9 kg)  12/18/15 147 lb (66.7 kg)    Physical Exam  Constitutional: He is oriented to person, place, and time. He appears well-developed and well-nourished.  HENT:  Head: Normocephalic.  Eyes: Conjunctivae are normal.  Neck: Neck supple.  Cardiovascular: Normal rate and regular rhythm.   Pulmonary/Chest: Effort normal and breath sounds normal.  Abdominal: Soft. Bowel sounds are normal.  No sign of infection in urostomy bag. No pain or masses felt.  Lymphadenopathy:    He has no cervical  adenopathy.  Neurological: He is alert and oriented to person, place, and time.  Psychiatric: He has a normal mood and affect. His behavior is normal.      Assessment & Plan:      1. Hyperkalemia Elevated K+ level last week on oncology follow up labs. Patient no taking any K+ supplements, but, eats a lot of nuts. Advised to restrict amount of nuts eaten and don't use "NoSalt" salt substitute because it is KCL. - Renal Function Panel  2. Malignant neoplasm of lateral wall of urinary bladder (HCC) Recent CT scan raised question of recurrence in some local lymph nodes. Follow up planned with oncologist (Dr. Vito Berger at [facility]) and PET scan on 09-08-16. - Renal Function Panel

## 2016-08-20 LAB — RENAL FUNCTION PANEL
Albumin: 4.3 g/dL (ref 3.5–4.8)
BUN/Creatinine Ratio: 13 (ref 10–24)
BUN: 14 mg/dL (ref 8–27)
CO2: 22 mmol/L (ref 18–29)
Calcium: 10.1 mg/dL (ref 8.6–10.2)
Chloride: 103 mmol/L (ref 96–106)
Creatinine, Ser: 1.04 mg/dL (ref 0.76–1.27)
GFR calc non Af Amer: 68 mL/min/{1.73_m2} (ref >59)
GFR, EST AFRICAN AMERICAN: 79 mL/min/{1.73_m2} (ref >59)
GLUCOSE: 89 mg/dL (ref 65–99)
PHOSPHORUS: 2.9 mg/dL (ref 2.5–4.5)
POTASSIUM: 4.6 mmol/L (ref 3.5–5.2)
SODIUM: 140 mmol/L (ref 134–144)

## 2016-08-21 ENCOUNTER — Telehealth: Payer: Self-pay

## 2016-08-21 NOTE — Telephone Encounter (Signed)
Patient's daughter Olin Hauser) advised.

## 2016-08-21 NOTE — Telephone Encounter (Signed)
-----   Message from Margo Common, [place] sent at 08/21/2016  3:51 PM EDT ----- Normal potassium, glucose, liver tests and kidney tests. Take copy of report to his next appointment with Dr. Birdena Jubilee.

## 2016-08-21 NOTE — Telephone Encounter (Signed)
LMTCB

## 2016-08-26 ENCOUNTER — Ambulatory Visit: Payer: Commercial Managed Care - HMO | Admitting: Family Medicine

## 2016-09-08 DIAGNOSIS — C672 Malignant neoplasm of lateral wall of bladder: Secondary | ICD-10-CM | POA: Diagnosis not present

## 2016-09-08 DIAGNOSIS — C689 Malignant neoplasm of urinary organ, unspecified: Secondary | ICD-10-CM | POA: Diagnosis not present

## 2016-09-22 DIAGNOSIS — R590 Localized enlarged lymph nodes: Secondary | ICD-10-CM | POA: Diagnosis not present

## 2016-09-22 DIAGNOSIS — C7989 Secondary malignant neoplasm of other specified sites: Secondary | ICD-10-CM | POA: Diagnosis not present

## 2016-09-22 DIAGNOSIS — C679 Malignant neoplasm of bladder, unspecified: Secondary | ICD-10-CM | POA: Diagnosis not present

## 2016-09-22 DIAGNOSIS — C672 Malignant neoplasm of lateral wall of bladder: Secondary | ICD-10-CM | POA: Diagnosis not present

## 2016-09-25 DIAGNOSIS — Z8042 Family history of malignant neoplasm of prostate: Secondary | ICD-10-CM | POA: Diagnosis not present

## 2016-09-25 DIAGNOSIS — C672 Malignant neoplasm of lateral wall of bladder: Secondary | ICD-10-CM | POA: Diagnosis not present

## 2016-09-25 DIAGNOSIS — C679 Malignant neoplasm of bladder, unspecified: Secondary | ICD-10-CM | POA: Diagnosis not present

## 2016-09-25 DIAGNOSIS — Z801 Family history of malignant neoplasm of trachea, bronchus and lung: Secondary | ICD-10-CM | POA: Diagnosis not present

## 2016-09-25 DIAGNOSIS — Z79899 Other long term (current) drug therapy: Secondary | ICD-10-CM | POA: Diagnosis not present

## 2016-09-25 DIAGNOSIS — Z87891 Personal history of nicotine dependence: Secondary | ICD-10-CM | POA: Diagnosis not present

## 2016-09-25 DIAGNOSIS — I10 Essential (primary) hypertension: Secondary | ICD-10-CM | POA: Diagnosis not present

## 2016-09-25 DIAGNOSIS — I2510 Atherosclerotic heart disease of native coronary artery without angina pectoris: Secondary | ICD-10-CM | POA: Diagnosis not present

## 2016-09-25 DIAGNOSIS — Z886 Allergy status to analgesic agent status: Secondary | ICD-10-CM | POA: Diagnosis not present

## 2016-09-25 DIAGNOSIS — E039 Hypothyroidism, unspecified: Secondary | ICD-10-CM | POA: Diagnosis not present

## 2016-10-08 DIAGNOSIS — Z955 Presence of coronary angioplasty implant and graft: Secondary | ICD-10-CM | POA: Diagnosis not present

## 2016-10-08 DIAGNOSIS — C679 Malignant neoplasm of bladder, unspecified: Secondary | ICD-10-CM | POA: Diagnosis not present

## 2016-10-08 DIAGNOSIS — Z87891 Personal history of nicotine dependence: Secondary | ICD-10-CM | POA: Diagnosis not present

## 2016-10-08 DIAGNOSIS — C61 Malignant neoplasm of prostate: Secondary | ICD-10-CM | POA: Diagnosis not present

## 2016-10-08 DIAGNOSIS — Z452 Encounter for adjustment and management of vascular access device: Secondary | ICD-10-CM | POA: Diagnosis not present

## 2016-10-08 DIAGNOSIS — I10 Essential (primary) hypertension: Secondary | ICD-10-CM | POA: Diagnosis not present

## 2016-10-08 DIAGNOSIS — I2510 Atherosclerotic heart disease of native coronary artery without angina pectoris: Secondary | ICD-10-CM | POA: Diagnosis not present

## 2016-10-15 DIAGNOSIS — Z8042 Family history of malignant neoplasm of prostate: Secondary | ICD-10-CM | POA: Diagnosis not present

## 2016-10-15 DIAGNOSIS — I10 Essential (primary) hypertension: Secondary | ICD-10-CM | POA: Diagnosis not present

## 2016-10-15 DIAGNOSIS — Z886 Allergy status to analgesic agent status: Secondary | ICD-10-CM | POA: Diagnosis not present

## 2016-10-15 DIAGNOSIS — C679 Malignant neoplasm of bladder, unspecified: Secondary | ICD-10-CM | POA: Diagnosis not present

## 2016-10-15 DIAGNOSIS — I2510 Atherosclerotic heart disease of native coronary artery without angina pectoris: Secondary | ICD-10-CM | POA: Diagnosis not present

## 2016-10-15 DIAGNOSIS — C61 Malignant neoplasm of prostate: Secondary | ICD-10-CM | POA: Diagnosis not present

## 2016-10-15 DIAGNOSIS — Z888 Allergy status to other drugs, medicaments and biological substances status: Secondary | ICD-10-CM | POA: Diagnosis not present

## 2016-10-15 DIAGNOSIS — Z87891 Personal history of nicotine dependence: Secondary | ICD-10-CM | POA: Diagnosis not present

## 2016-10-15 DIAGNOSIS — Z801 Family history of malignant neoplasm of trachea, bronchus and lung: Secondary | ICD-10-CM | POA: Diagnosis not present

## 2016-10-29 DIAGNOSIS — I10 Essential (primary) hypertension: Secondary | ICD-10-CM | POA: Diagnosis not present

## 2016-10-29 DIAGNOSIS — Z955 Presence of coronary angioplasty implant and graft: Secondary | ICD-10-CM | POA: Diagnosis not present

## 2016-10-29 DIAGNOSIS — K5900 Constipation, unspecified: Secondary | ICD-10-CM | POA: Diagnosis not present

## 2016-10-29 DIAGNOSIS — C672 Malignant neoplasm of lateral wall of bladder: Secondary | ICD-10-CM | POA: Diagnosis not present

## 2016-10-29 DIAGNOSIS — C61 Malignant neoplasm of prostate: Secondary | ICD-10-CM | POA: Diagnosis not present

## 2016-10-29 DIAGNOSIS — Z5112 Encounter for antineoplastic immunotherapy: Secondary | ICD-10-CM | POA: Diagnosis not present

## 2016-10-29 DIAGNOSIS — Z906 Acquired absence of other parts of urinary tract: Secondary | ICD-10-CM | POA: Diagnosis not present

## 2016-10-29 DIAGNOSIS — I2510 Atherosclerotic heart disease of native coronary artery without angina pectoris: Secondary | ICD-10-CM | POA: Diagnosis not present

## 2016-10-29 DIAGNOSIS — Z87891 Personal history of nicotine dependence: Secondary | ICD-10-CM | POA: Diagnosis not present

## 2016-11-19 DIAGNOSIS — Z5112 Encounter for antineoplastic immunotherapy: Secondary | ICD-10-CM | POA: Diagnosis not present

## 2016-11-19 DIAGNOSIS — K5900 Constipation, unspecified: Secondary | ICD-10-CM | POA: Diagnosis not present

## 2016-11-19 DIAGNOSIS — I10 Essential (primary) hypertension: Secondary | ICD-10-CM | POA: Diagnosis not present

## 2016-11-19 DIAGNOSIS — R21 Rash and other nonspecific skin eruption: Secondary | ICD-10-CM | POA: Diagnosis not present

## 2016-11-19 DIAGNOSIS — Z87891 Personal history of nicotine dependence: Secondary | ICD-10-CM | POA: Diagnosis not present

## 2016-11-19 DIAGNOSIS — C61 Malignant neoplasm of prostate: Secondary | ICD-10-CM | POA: Diagnosis not present

## 2016-11-19 DIAGNOSIS — Z79899 Other long term (current) drug therapy: Secondary | ICD-10-CM | POA: Diagnosis not present

## 2016-11-19 DIAGNOSIS — I2510 Atherosclerotic heart disease of native coronary artery without angina pectoris: Secondary | ICD-10-CM | POA: Diagnosis not present

## 2016-11-19 DIAGNOSIS — C672 Malignant neoplasm of lateral wall of bladder: Secondary | ICD-10-CM | POA: Diagnosis not present

## 2016-12-03 DIAGNOSIS — C672 Malignant neoplasm of lateral wall of bladder: Secondary | ICD-10-CM | POA: Diagnosis not present

## 2016-12-03 DIAGNOSIS — Z87891 Personal history of nicotine dependence: Secondary | ICD-10-CM | POA: Diagnosis not present

## 2016-12-03 DIAGNOSIS — I10 Essential (primary) hypertension: Secondary | ICD-10-CM | POA: Diagnosis not present

## 2016-12-03 DIAGNOSIS — C61 Malignant neoplasm of prostate: Secondary | ICD-10-CM | POA: Diagnosis not present

## 2016-12-03 DIAGNOSIS — R21 Rash and other nonspecific skin eruption: Secondary | ICD-10-CM | POA: Diagnosis not present

## 2016-12-03 DIAGNOSIS — I2510 Atherosclerotic heart disease of native coronary artery without angina pectoris: Secondary | ICD-10-CM | POA: Diagnosis not present

## 2016-12-03 DIAGNOSIS — J439 Emphysema, unspecified: Secondary | ICD-10-CM | POA: Diagnosis not present

## 2016-12-03 DIAGNOSIS — Z955 Presence of coronary angioplasty implant and graft: Secondary | ICD-10-CM | POA: Diagnosis not present

## 2016-12-03 DIAGNOSIS — K435 Parastomal hernia without obstruction or  gangrene: Secondary | ICD-10-CM | POA: Diagnosis not present

## 2016-12-03 DIAGNOSIS — R590 Localized enlarged lymph nodes: Secondary | ICD-10-CM | POA: Diagnosis not present

## 2016-12-10 DIAGNOSIS — C672 Malignant neoplasm of lateral wall of bladder: Secondary | ICD-10-CM | POA: Diagnosis not present

## 2016-12-10 DIAGNOSIS — R590 Localized enlarged lymph nodes: Secondary | ICD-10-CM | POA: Diagnosis not present

## 2016-12-10 DIAGNOSIS — C61 Malignant neoplasm of prostate: Secondary | ICD-10-CM | POA: Diagnosis not present

## 2016-12-10 DIAGNOSIS — Z87891 Personal history of nicotine dependence: Secondary | ICD-10-CM | POA: Diagnosis not present

## 2016-12-10 DIAGNOSIS — Z5112 Encounter for antineoplastic immunotherapy: Secondary | ICD-10-CM | POA: Diagnosis not present

## 2016-12-10 DIAGNOSIS — R21 Rash and other nonspecific skin eruption: Secondary | ICD-10-CM | POA: Diagnosis not present

## 2016-12-10 DIAGNOSIS — K435 Parastomal hernia without obstruction or  gangrene: Secondary | ICD-10-CM | POA: Diagnosis not present

## 2016-12-10 DIAGNOSIS — I10 Essential (primary) hypertension: Secondary | ICD-10-CM | POA: Diagnosis not present

## 2016-12-10 DIAGNOSIS — I2510 Atherosclerotic heart disease of native coronary artery without angina pectoris: Secondary | ICD-10-CM | POA: Diagnosis not present

## 2016-12-31 DIAGNOSIS — I10 Essential (primary) hypertension: Secondary | ICD-10-CM | POA: Diagnosis not present

## 2016-12-31 DIAGNOSIS — I2510 Atherosclerotic heart disease of native coronary artery without angina pectoris: Secondary | ICD-10-CM | POA: Diagnosis not present

## 2016-12-31 DIAGNOSIS — Z5112 Encounter for antineoplastic immunotherapy: Secondary | ICD-10-CM | POA: Diagnosis not present

## 2016-12-31 DIAGNOSIS — C779 Secondary and unspecified malignant neoplasm of lymph node, unspecified: Secondary | ICD-10-CM | POA: Diagnosis not present

## 2016-12-31 DIAGNOSIS — Z87891 Personal history of nicotine dependence: Secondary | ICD-10-CM | POA: Diagnosis not present

## 2016-12-31 DIAGNOSIS — Z933 Colostomy status: Secondary | ICD-10-CM | POA: Diagnosis not present

## 2016-12-31 DIAGNOSIS — R21 Rash and other nonspecific skin eruption: Secondary | ICD-10-CM | POA: Diagnosis not present

## 2016-12-31 DIAGNOSIS — C672 Malignant neoplasm of lateral wall of bladder: Secondary | ICD-10-CM | POA: Diagnosis not present

## 2016-12-31 DIAGNOSIS — Z8546 Personal history of malignant neoplasm of prostate: Secondary | ICD-10-CM | POA: Diagnosis not present

## 2016-12-31 DIAGNOSIS — C679 Malignant neoplasm of bladder, unspecified: Secondary | ICD-10-CM | POA: Diagnosis not present

## 2017-01-02 DIAGNOSIS — C7911 Secondary malignant neoplasm of bladder: Secondary | ICD-10-CM | POA: Diagnosis not present

## 2017-01-02 DIAGNOSIS — Z936 Other artificial openings of urinary tract status: Secondary | ICD-10-CM | POA: Diagnosis not present

## 2017-01-07 DIAGNOSIS — C7911 Secondary malignant neoplasm of bladder: Secondary | ICD-10-CM | POA: Diagnosis not present

## 2017-01-07 DIAGNOSIS — Z936 Other artificial openings of urinary tract status: Secondary | ICD-10-CM | POA: Diagnosis not present

## 2017-01-21 DIAGNOSIS — C775 Secondary and unspecified malignant neoplasm of intrapelvic lymph nodes: Secondary | ICD-10-CM | POA: Diagnosis not present

## 2017-01-21 DIAGNOSIS — C61 Malignant neoplasm of prostate: Secondary | ICD-10-CM | POA: Diagnosis not present

## 2017-01-21 DIAGNOSIS — Z5112 Encounter for antineoplastic immunotherapy: Secondary | ICD-10-CM | POA: Diagnosis not present

## 2017-01-21 DIAGNOSIS — C672 Malignant neoplasm of lateral wall of bladder: Secondary | ICD-10-CM | POA: Diagnosis not present

## 2017-01-21 DIAGNOSIS — Z888 Allergy status to other drugs, medicaments and biological substances status: Secondary | ICD-10-CM | POA: Diagnosis not present

## 2017-01-21 DIAGNOSIS — Z955 Presence of coronary angioplasty implant and graft: Secondary | ICD-10-CM | POA: Diagnosis not present

## 2017-01-21 DIAGNOSIS — Z8042 Family history of malignant neoplasm of prostate: Secondary | ICD-10-CM | POA: Diagnosis not present

## 2017-01-21 DIAGNOSIS — I10 Essential (primary) hypertension: Secondary | ICD-10-CM | POA: Diagnosis not present

## 2017-01-21 DIAGNOSIS — I2510 Atherosclerotic heart disease of native coronary artery without angina pectoris: Secondary | ICD-10-CM | POA: Diagnosis not present

## 2017-01-21 DIAGNOSIS — C679 Malignant neoplasm of bladder, unspecified: Secondary | ICD-10-CM | POA: Diagnosis not present

## 2017-02-05 DIAGNOSIS — R599 Enlarged lymph nodes, unspecified: Secondary | ICD-10-CM | POA: Diagnosis not present

## 2017-02-05 DIAGNOSIS — J9811 Atelectasis: Secondary | ICD-10-CM | POA: Diagnosis not present

## 2017-02-05 DIAGNOSIS — K435 Parastomal hernia without obstruction or  gangrene: Secondary | ICD-10-CM | POA: Diagnosis not present

## 2017-02-05 DIAGNOSIS — N200 Calculus of kidney: Secondary | ICD-10-CM | POA: Diagnosis not present

## 2017-02-05 DIAGNOSIS — C679 Malignant neoplasm of bladder, unspecified: Secondary | ICD-10-CM | POA: Diagnosis not present

## 2017-02-05 DIAGNOSIS — R918 Other nonspecific abnormal finding of lung field: Secondary | ICD-10-CM | POA: Diagnosis not present

## 2017-02-11 DIAGNOSIS — Z906 Acquired absence of other parts of urinary tract: Secondary | ICD-10-CM | POA: Diagnosis not present

## 2017-02-11 DIAGNOSIS — C672 Malignant neoplasm of lateral wall of bladder: Secondary | ICD-10-CM | POA: Diagnosis not present

## 2017-02-11 DIAGNOSIS — Z955 Presence of coronary angioplasty implant and graft: Secondary | ICD-10-CM | POA: Diagnosis not present

## 2017-02-11 DIAGNOSIS — I2510 Atherosclerotic heart disease of native coronary artery without angina pectoris: Secondary | ICD-10-CM | POA: Diagnosis not present

## 2017-02-11 DIAGNOSIS — Z7982 Long term (current) use of aspirin: Secondary | ICD-10-CM | POA: Diagnosis not present

## 2017-02-11 DIAGNOSIS — Z87891 Personal history of nicotine dependence: Secondary | ICD-10-CM | POA: Diagnosis not present

## 2017-02-11 DIAGNOSIS — C61 Malignant neoplasm of prostate: Secondary | ICD-10-CM | POA: Diagnosis not present

## 2017-02-11 DIAGNOSIS — I10 Essential (primary) hypertension: Secondary | ICD-10-CM | POA: Diagnosis not present

## 2017-02-11 DIAGNOSIS — Z5112 Encounter for antineoplastic immunotherapy: Secondary | ICD-10-CM | POA: Diagnosis not present

## 2017-02-16 IMAGING — US US RENAL KIDNEY
1 series · 13 of 25 positions shown · non-contrast
Comparison: None

CLINICAL DATA: Gross hematuria

EXAM:
RENAL/URINARY TRACT ULTRASOUND COMPLETE

[Series 1: us renal kidney · 0.28mm/px · 13 of 63 slices shown]
[im 1/63]
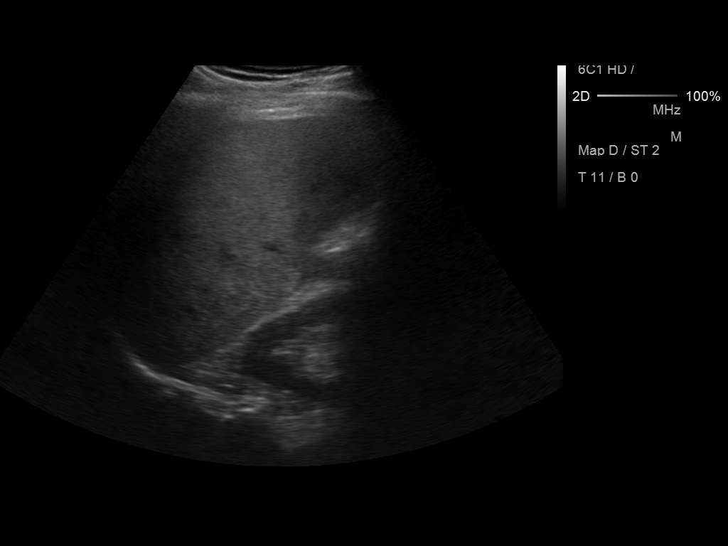
[im 6/63]
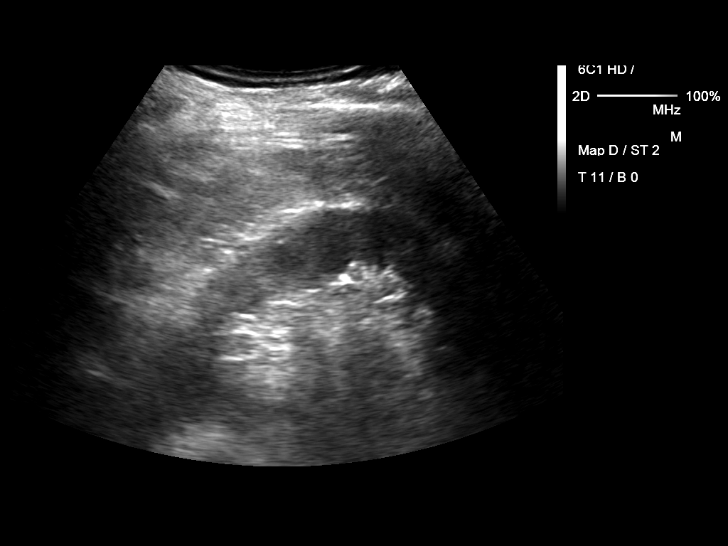
[im 11/63]
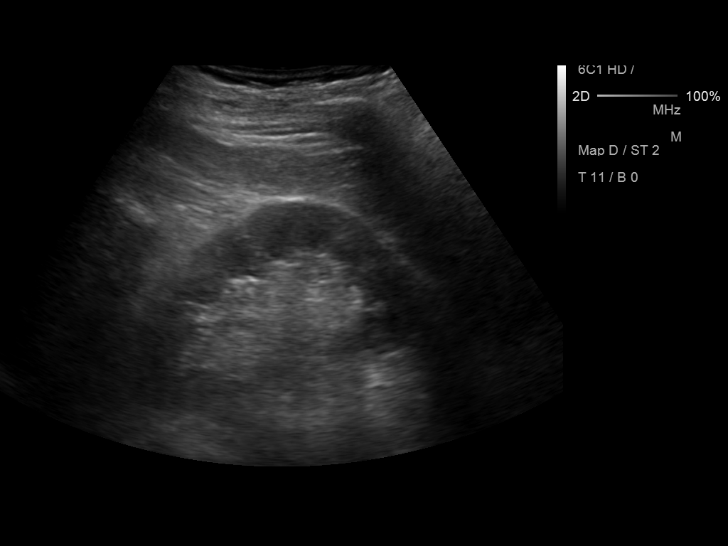
[im 16/63]
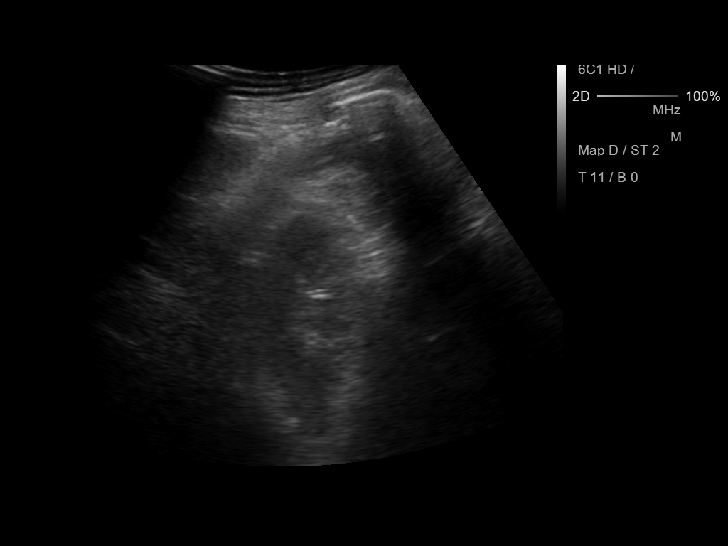
[im 21/63]
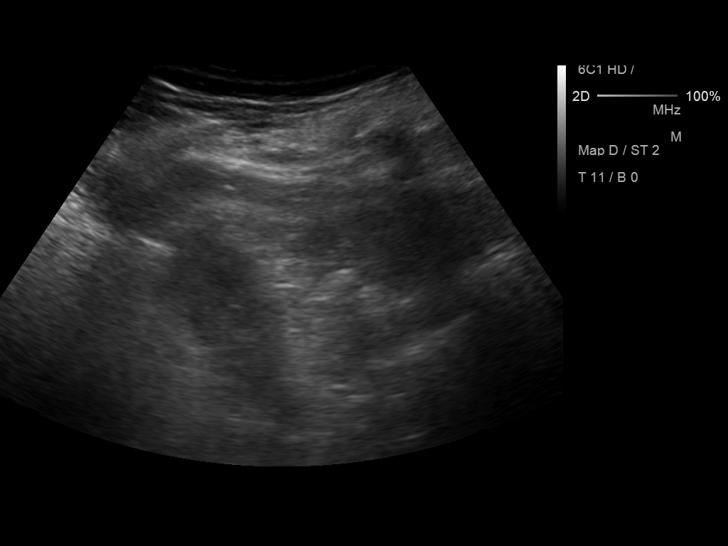
[im 26/63]
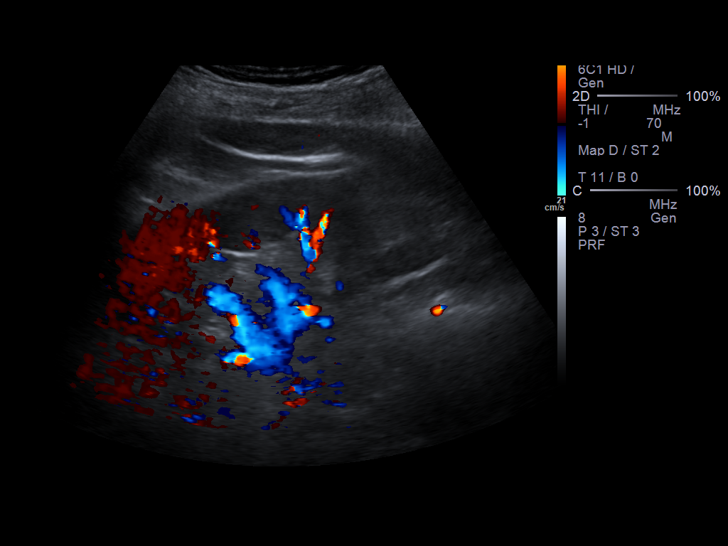
[im 32/63]
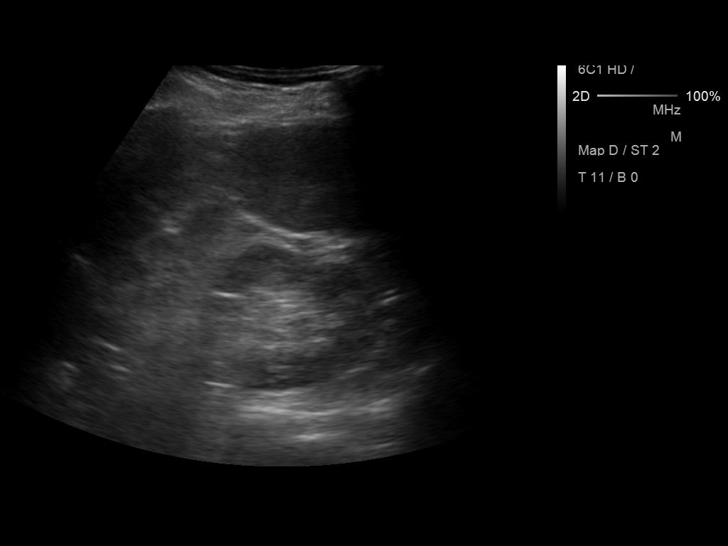
[im 37/63]
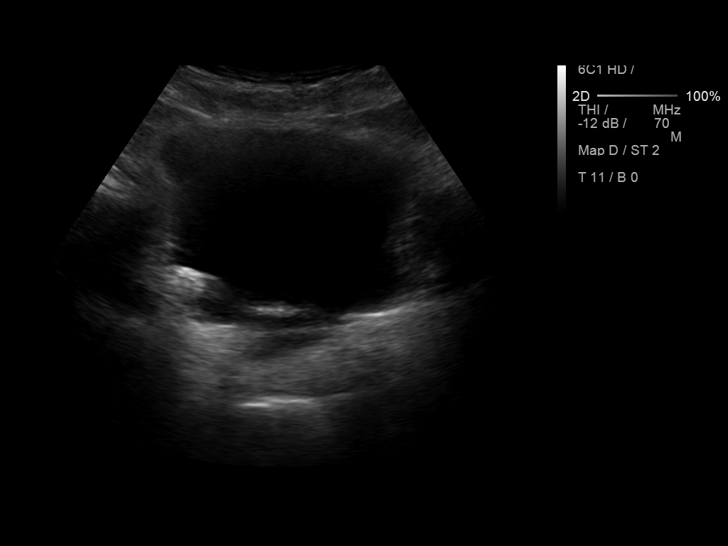
[im 42/63]
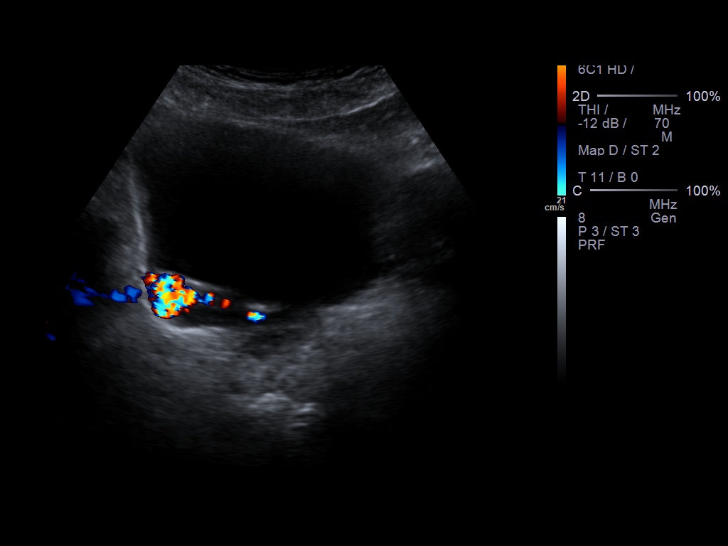
[im 47/63]
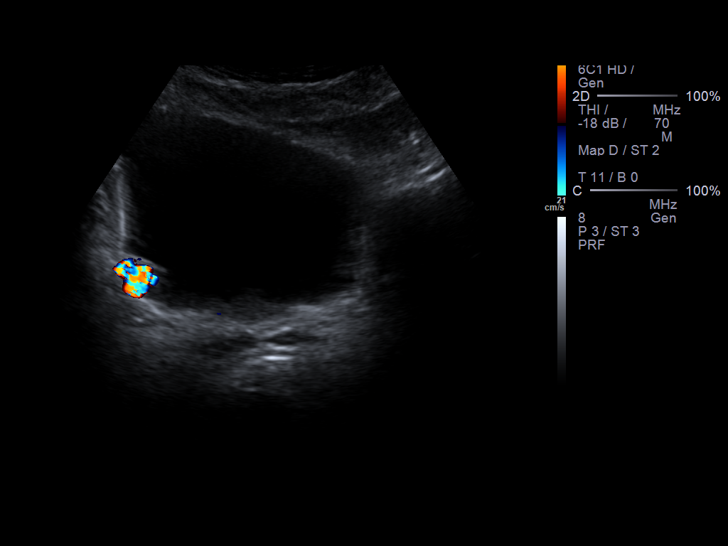
[im 52/63]
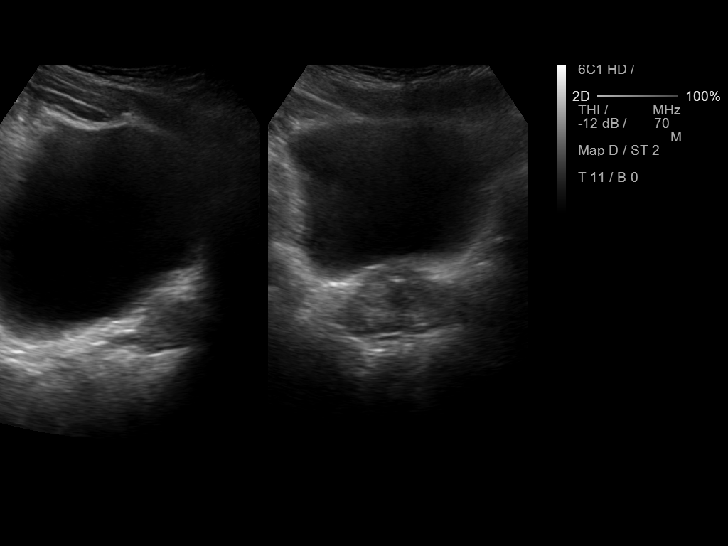
[im 57/63]
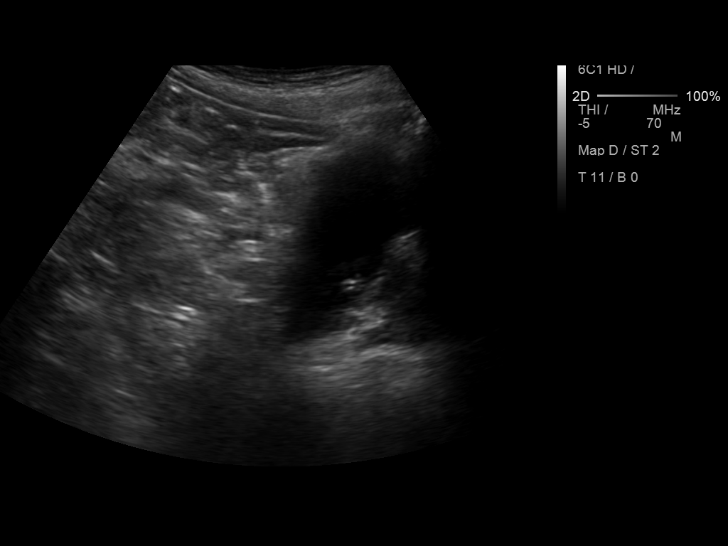
[im 63/63]
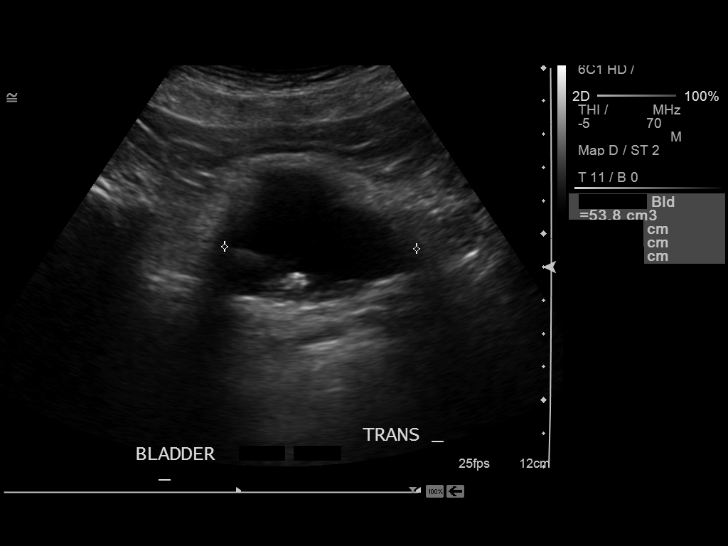

[13 of 25 positions shown; findings below may reference images not displayed]

FINDINGS: Right Kidney:

Length: 9.3 cm. Normal cortical thickness and echogenicity. Bright
echogenic focus at mid kidney without definite shadowing question 5
mm nonobstructing calculus. No mass, hydronephrosis or perinephric
fluid collection.

Left Kidney:

Length: 10.8 cm. Normal cortical thickness and echogenicity. No
mass, hydronephrosis shadowing calcification.

Bladder:

Well distended, calculated volume 291 mL with a 54 mL postvoid
residual volume. BILATERAL ureteral jets seen. Two intraluminal
echogenic foci are seen within urinary bladder without discrete
shadowing, could represent calcifications less likely polyp or
tumor, measuring 14 mm and 11 mm in greatest sizes. Prostate gland
measures 4.0 x 2.1 x 5.2 cm.
IMPRESSION: Question nonobstructing calculus 5 mm diameter mid RIGHT kidney.

Otherwise negative renal ultrasound.

Intraluminal echogenic non shadowing foci within urinary bladder
favor non shadowing calculi but as these are non shadowing,
masses/polyps are not completely excluded; either cystoscopic or CT
evaluation recommended to definitively exclude mass.

Minimal prostatic enlargement.

## 2017-03-04 DIAGNOSIS — R1909 Other intra-abdominal and pelvic swelling, mass and lump: Secondary | ICD-10-CM | POA: Diagnosis not present

## 2017-03-04 DIAGNOSIS — R590 Localized enlarged lymph nodes: Secondary | ICD-10-CM | POA: Diagnosis not present

## 2017-03-04 DIAGNOSIS — K5900 Constipation, unspecified: Secondary | ICD-10-CM | POA: Diagnosis not present

## 2017-03-04 DIAGNOSIS — Z8546 Personal history of malignant neoplasm of prostate: Secondary | ICD-10-CM | POA: Diagnosis not present

## 2017-03-04 DIAGNOSIS — K435 Parastomal hernia without obstruction or  gangrene: Secondary | ICD-10-CM | POA: Diagnosis not present

## 2017-03-04 DIAGNOSIS — I2510 Atherosclerotic heart disease of native coronary artery without angina pectoris: Secondary | ICD-10-CM | POA: Diagnosis not present

## 2017-03-04 DIAGNOSIS — I10 Essential (primary) hypertension: Secondary | ICD-10-CM | POA: Diagnosis not present

## 2017-03-04 DIAGNOSIS — C672 Malignant neoplasm of lateral wall of bladder: Secondary | ICD-10-CM | POA: Diagnosis not present

## 2017-03-04 DIAGNOSIS — Z5112 Encounter for antineoplastic immunotherapy: Secondary | ICD-10-CM | POA: Diagnosis not present

## 2017-03-25 DIAGNOSIS — C672 Malignant neoplasm of lateral wall of bladder: Secondary | ICD-10-CM | POA: Diagnosis not present

## 2017-03-25 DIAGNOSIS — Z5112 Encounter for antineoplastic immunotherapy: Secondary | ICD-10-CM | POA: Diagnosis not present

## 2017-03-25 DIAGNOSIS — I10 Essential (primary) hypertension: Secondary | ICD-10-CM | POA: Diagnosis not present

## 2017-03-25 DIAGNOSIS — Z8546 Personal history of malignant neoplasm of prostate: Secondary | ICD-10-CM | POA: Diagnosis not present

## 2017-03-25 DIAGNOSIS — Z87891 Personal history of nicotine dependence: Secondary | ICD-10-CM | POA: Diagnosis not present

## 2017-03-25 DIAGNOSIS — K5900 Constipation, unspecified: Secondary | ICD-10-CM | POA: Diagnosis not present

## 2017-03-25 DIAGNOSIS — I2510 Atherosclerotic heart disease of native coronary artery without angina pectoris: Secondary | ICD-10-CM | POA: Diagnosis not present

## 2017-03-25 DIAGNOSIS — Z955 Presence of coronary angioplasty implant and graft: Secondary | ICD-10-CM | POA: Diagnosis not present

## 2017-03-25 DIAGNOSIS — Z906 Acquired absence of other parts of urinary tract: Secondary | ICD-10-CM | POA: Diagnosis not present

## 2017-04-06 DIAGNOSIS — Z936 Other artificial openings of urinary tract status: Secondary | ICD-10-CM | POA: Diagnosis not present

## 2017-04-06 DIAGNOSIS — C7911 Secondary malignant neoplasm of bladder: Secondary | ICD-10-CM | POA: Diagnosis not present

## 2017-04-09 DIAGNOSIS — C778 Secondary and unspecified malignant neoplasm of lymph nodes of multiple regions: Secondary | ICD-10-CM | POA: Diagnosis not present

## 2017-04-09 DIAGNOSIS — R590 Localized enlarged lymph nodes: Secondary | ICD-10-CM | POA: Diagnosis not present

## 2017-04-09 DIAGNOSIS — C775 Secondary and unspecified malignant neoplasm of intrapelvic lymph nodes: Secondary | ICD-10-CM | POA: Diagnosis not present

## 2017-04-09 DIAGNOSIS — C679 Malignant neoplasm of bladder, unspecified: Secondary | ICD-10-CM | POA: Diagnosis not present

## 2017-04-10 DIAGNOSIS — Z936 Other artificial openings of urinary tract status: Secondary | ICD-10-CM | POA: Diagnosis not present

## 2017-04-10 DIAGNOSIS — C7911 Secondary malignant neoplasm of bladder: Secondary | ICD-10-CM | POA: Diagnosis not present

## 2017-04-15 DIAGNOSIS — K435 Parastomal hernia without obstruction or  gangrene: Secondary | ICD-10-CM | POA: Diagnosis not present

## 2017-04-15 DIAGNOSIS — Z87891 Personal history of nicotine dependence: Secondary | ICD-10-CM | POA: Diagnosis not present

## 2017-04-15 DIAGNOSIS — C679 Malignant neoplasm of bladder, unspecified: Secondary | ICD-10-CM | POA: Diagnosis not present

## 2017-04-15 DIAGNOSIS — Z452 Encounter for adjustment and management of vascular access device: Secondary | ICD-10-CM | POA: Diagnosis not present

## 2017-04-15 DIAGNOSIS — R197 Diarrhea, unspecified: Secondary | ICD-10-CM | POA: Diagnosis not present

## 2017-04-15 DIAGNOSIS — C61 Malignant neoplasm of prostate: Secondary | ICD-10-CM | POA: Diagnosis not present

## 2017-04-15 DIAGNOSIS — Z5112 Encounter for antineoplastic immunotherapy: Secondary | ICD-10-CM | POA: Diagnosis not present

## 2017-04-15 DIAGNOSIS — C775 Secondary and unspecified malignant neoplasm of intrapelvic lymph nodes: Secondary | ICD-10-CM | POA: Diagnosis not present

## 2017-04-15 DIAGNOSIS — C672 Malignant neoplasm of lateral wall of bladder: Secondary | ICD-10-CM | POA: Diagnosis not present

## 2017-04-15 DIAGNOSIS — I10 Essential (primary) hypertension: Secondary | ICD-10-CM | POA: Diagnosis not present

## 2017-04-24 DIAGNOSIS — R109 Unspecified abdominal pain: Secondary | ICD-10-CM | POA: Diagnosis not present

## 2017-04-24 DIAGNOSIS — M79604 Pain in right leg: Secondary | ICD-10-CM | POA: Diagnosis not present

## 2017-04-24 DIAGNOSIS — C672 Malignant neoplasm of lateral wall of bladder: Secondary | ICD-10-CM | POA: Diagnosis not present

## 2017-04-24 DIAGNOSIS — M25551 Pain in right hip: Secondary | ICD-10-CM | POA: Diagnosis not present

## 2017-04-24 DIAGNOSIS — Z510 Encounter for antineoplastic radiation therapy: Secondary | ICD-10-CM | POA: Diagnosis not present

## 2017-04-24 DIAGNOSIS — Z906 Acquired absence of other parts of urinary tract: Secondary | ICD-10-CM | POA: Diagnosis not present

## 2017-04-24 DIAGNOSIS — C775 Secondary and unspecified malignant neoplasm of intrapelvic lymph nodes: Secondary | ICD-10-CM | POA: Diagnosis not present

## 2017-04-24 DIAGNOSIS — Z8551 Personal history of malignant neoplasm of bladder: Secondary | ICD-10-CM | POA: Diagnosis not present

## 2017-04-24 DIAGNOSIS — G8929 Other chronic pain: Secondary | ICD-10-CM | POA: Diagnosis not present

## 2017-04-24 DIAGNOSIS — I2510 Atherosclerotic heart disease of native coronary artery without angina pectoris: Secondary | ICD-10-CM | POA: Diagnosis not present

## 2017-04-29 DIAGNOSIS — C672 Malignant neoplasm of lateral wall of bladder: Secondary | ICD-10-CM | POA: Diagnosis not present

## 2017-04-29 DIAGNOSIS — M79604 Pain in right leg: Secondary | ICD-10-CM | POA: Diagnosis not present

## 2017-04-29 DIAGNOSIS — Z906 Acquired absence of other parts of urinary tract: Secondary | ICD-10-CM | POA: Diagnosis not present

## 2017-04-29 DIAGNOSIS — C775 Secondary and unspecified malignant neoplasm of intrapelvic lymph nodes: Secondary | ICD-10-CM | POA: Diagnosis not present

## 2017-04-29 DIAGNOSIS — Z510 Encounter for antineoplastic radiation therapy: Secondary | ICD-10-CM | POA: Diagnosis not present

## 2017-04-29 DIAGNOSIS — G8929 Other chronic pain: Secondary | ICD-10-CM | POA: Diagnosis not present

## 2017-04-29 DIAGNOSIS — I2510 Atherosclerotic heart disease of native coronary artery without angina pectoris: Secondary | ICD-10-CM | POA: Diagnosis not present

## 2017-04-29 DIAGNOSIS — Z8551 Personal history of malignant neoplasm of bladder: Secondary | ICD-10-CM | POA: Diagnosis not present

## 2017-04-29 DIAGNOSIS — M25551 Pain in right hip: Secondary | ICD-10-CM | POA: Diagnosis not present

## 2017-04-29 DIAGNOSIS — R109 Unspecified abdominal pain: Secondary | ICD-10-CM | POA: Diagnosis not present

## 2017-05-04 DIAGNOSIS — C775 Secondary and unspecified malignant neoplasm of intrapelvic lymph nodes: Secondary | ICD-10-CM | POA: Diagnosis not present

## 2017-05-04 DIAGNOSIS — Z510 Encounter for antineoplastic radiation therapy: Secondary | ICD-10-CM | POA: Diagnosis not present

## 2017-05-04 DIAGNOSIS — M25551 Pain in right hip: Secondary | ICD-10-CM | POA: Diagnosis not present

## 2017-05-04 DIAGNOSIS — I2510 Atherosclerotic heart disease of native coronary artery without angina pectoris: Secondary | ICD-10-CM | POA: Diagnosis not present

## 2017-05-04 DIAGNOSIS — M79604 Pain in right leg: Secondary | ICD-10-CM | POA: Diagnosis not present

## 2017-05-04 DIAGNOSIS — Z8551 Personal history of malignant neoplasm of bladder: Secondary | ICD-10-CM | POA: Diagnosis not present

## 2017-05-04 DIAGNOSIS — Z906 Acquired absence of other parts of urinary tract: Secondary | ICD-10-CM | POA: Diagnosis not present

## 2017-05-04 DIAGNOSIS — R109 Unspecified abdominal pain: Secondary | ICD-10-CM | POA: Diagnosis not present

## 2017-05-04 DIAGNOSIS — G8929 Other chronic pain: Secondary | ICD-10-CM | POA: Diagnosis not present

## 2017-05-06 DIAGNOSIS — I2510 Atherosclerotic heart disease of native coronary artery without angina pectoris: Secondary | ICD-10-CM | POA: Diagnosis not present

## 2017-05-06 DIAGNOSIS — Z510 Encounter for antineoplastic radiation therapy: Secondary | ICD-10-CM | POA: Diagnosis not present

## 2017-05-06 DIAGNOSIS — C775 Secondary and unspecified malignant neoplasm of intrapelvic lymph nodes: Secondary | ICD-10-CM | POA: Diagnosis not present

## 2017-05-06 DIAGNOSIS — Z8551 Personal history of malignant neoplasm of bladder: Secondary | ICD-10-CM | POA: Diagnosis not present

## 2017-05-06 DIAGNOSIS — R109 Unspecified abdominal pain: Secondary | ICD-10-CM | POA: Diagnosis not present

## 2017-05-06 DIAGNOSIS — C672 Malignant neoplasm of lateral wall of bladder: Secondary | ICD-10-CM | POA: Diagnosis not present

## 2017-05-06 DIAGNOSIS — Z906 Acquired absence of other parts of urinary tract: Secondary | ICD-10-CM | POA: Diagnosis not present

## 2017-05-06 DIAGNOSIS — G8929 Other chronic pain: Secondary | ICD-10-CM | POA: Diagnosis not present

## 2017-05-06 DIAGNOSIS — M79604 Pain in right leg: Secondary | ICD-10-CM | POA: Diagnosis not present

## 2017-05-06 DIAGNOSIS — M25551 Pain in right hip: Secondary | ICD-10-CM | POA: Diagnosis not present

## 2017-05-08 DIAGNOSIS — Z87891 Personal history of nicotine dependence: Secondary | ICD-10-CM | POA: Diagnosis not present

## 2017-05-08 DIAGNOSIS — C672 Malignant neoplasm of lateral wall of bladder: Secondary | ICD-10-CM | POA: Diagnosis not present

## 2017-05-08 DIAGNOSIS — I10 Essential (primary) hypertension: Secondary | ICD-10-CM | POA: Diagnosis not present

## 2017-05-08 DIAGNOSIS — Z79899 Other long term (current) drug therapy: Secondary | ICD-10-CM | POA: Diagnosis not present

## 2017-05-08 DIAGNOSIS — I2510 Atherosclerotic heart disease of native coronary artery without angina pectoris: Secondary | ICD-10-CM | POA: Diagnosis not present

## 2017-05-08 DIAGNOSIS — Z7982 Long term (current) use of aspirin: Secondary | ICD-10-CM | POA: Diagnosis not present

## 2017-05-18 DIAGNOSIS — R109 Unspecified abdominal pain: Secondary | ICD-10-CM | POA: Diagnosis not present

## 2017-05-18 DIAGNOSIS — G8929 Other chronic pain: Secondary | ICD-10-CM | POA: Diagnosis not present

## 2017-05-18 DIAGNOSIS — Z510 Encounter for antineoplastic radiation therapy: Secondary | ICD-10-CM | POA: Diagnosis not present

## 2017-05-18 DIAGNOSIS — Z906 Acquired absence of other parts of urinary tract: Secondary | ICD-10-CM | POA: Diagnosis not present

## 2017-05-18 DIAGNOSIS — Z8551 Personal history of malignant neoplasm of bladder: Secondary | ICD-10-CM | POA: Diagnosis not present

## 2017-05-18 DIAGNOSIS — C775 Secondary and unspecified malignant neoplasm of intrapelvic lymph nodes: Secondary | ICD-10-CM | POA: Diagnosis not present

## 2017-05-18 DIAGNOSIS — M79604 Pain in right leg: Secondary | ICD-10-CM | POA: Diagnosis not present

## 2017-05-18 DIAGNOSIS — I2510 Atherosclerotic heart disease of native coronary artery without angina pectoris: Secondary | ICD-10-CM | POA: Diagnosis not present

## 2017-05-18 DIAGNOSIS — M25551 Pain in right hip: Secondary | ICD-10-CM | POA: Diagnosis not present

## 2017-05-19 DIAGNOSIS — M25551 Pain in right hip: Secondary | ICD-10-CM | POA: Diagnosis not present

## 2017-05-19 DIAGNOSIS — C775 Secondary and unspecified malignant neoplasm of intrapelvic lymph nodes: Secondary | ICD-10-CM | POA: Diagnosis not present

## 2017-05-19 DIAGNOSIS — Z906 Acquired absence of other parts of urinary tract: Secondary | ICD-10-CM | POA: Diagnosis not present

## 2017-05-19 DIAGNOSIS — Z510 Encounter for antineoplastic radiation therapy: Secondary | ICD-10-CM | POA: Diagnosis not present

## 2017-05-19 DIAGNOSIS — Z8551 Personal history of malignant neoplasm of bladder: Secondary | ICD-10-CM | POA: Diagnosis not present

## 2017-05-19 DIAGNOSIS — G8929 Other chronic pain: Secondary | ICD-10-CM | POA: Diagnosis not present

## 2017-05-19 DIAGNOSIS — M79604 Pain in right leg: Secondary | ICD-10-CM | POA: Diagnosis not present

## 2017-05-19 DIAGNOSIS — I2510 Atherosclerotic heart disease of native coronary artery without angina pectoris: Secondary | ICD-10-CM | POA: Diagnosis not present

## 2017-05-19 DIAGNOSIS — R109 Unspecified abdominal pain: Secondary | ICD-10-CM | POA: Diagnosis not present

## 2017-05-20 DIAGNOSIS — C672 Malignant neoplasm of lateral wall of bladder: Secondary | ICD-10-CM | POA: Diagnosis not present

## 2017-05-20 DIAGNOSIS — M79604 Pain in right leg: Secondary | ICD-10-CM | POA: Diagnosis not present

## 2017-05-20 DIAGNOSIS — Z8551 Personal history of malignant neoplasm of bladder: Secondary | ICD-10-CM | POA: Diagnosis not present

## 2017-05-20 DIAGNOSIS — Z906 Acquired absence of other parts of urinary tract: Secondary | ICD-10-CM | POA: Diagnosis not present

## 2017-05-20 DIAGNOSIS — R109 Unspecified abdominal pain: Secondary | ICD-10-CM | POA: Diagnosis not present

## 2017-05-20 DIAGNOSIS — I2510 Atherosclerotic heart disease of native coronary artery without angina pectoris: Secondary | ICD-10-CM | POA: Diagnosis not present

## 2017-05-20 DIAGNOSIS — Z510 Encounter for antineoplastic radiation therapy: Secondary | ICD-10-CM | POA: Diagnosis not present

## 2017-05-20 DIAGNOSIS — C775 Secondary and unspecified malignant neoplasm of intrapelvic lymph nodes: Secondary | ICD-10-CM | POA: Diagnosis not present

## 2017-05-20 DIAGNOSIS — M25551 Pain in right hip: Secondary | ICD-10-CM | POA: Diagnosis not present

## 2017-05-20 DIAGNOSIS — G8929 Other chronic pain: Secondary | ICD-10-CM | POA: Diagnosis not present

## 2017-05-21 DIAGNOSIS — R109 Unspecified abdominal pain: Secondary | ICD-10-CM | POA: Diagnosis not present

## 2017-05-21 DIAGNOSIS — M79604 Pain in right leg: Secondary | ICD-10-CM | POA: Diagnosis not present

## 2017-05-21 DIAGNOSIS — I2510 Atherosclerotic heart disease of native coronary artery without angina pectoris: Secondary | ICD-10-CM | POA: Diagnosis not present

## 2017-05-21 DIAGNOSIS — G8929 Other chronic pain: Secondary | ICD-10-CM | POA: Diagnosis not present

## 2017-05-21 DIAGNOSIS — Z8551 Personal history of malignant neoplasm of bladder: Secondary | ICD-10-CM | POA: Diagnosis not present

## 2017-05-21 DIAGNOSIS — Z510 Encounter for antineoplastic radiation therapy: Secondary | ICD-10-CM | POA: Diagnosis not present

## 2017-05-21 DIAGNOSIS — C775 Secondary and unspecified malignant neoplasm of intrapelvic lymph nodes: Secondary | ICD-10-CM | POA: Diagnosis not present

## 2017-05-21 DIAGNOSIS — M25551 Pain in right hip: Secondary | ICD-10-CM | POA: Diagnosis not present

## 2017-05-21 DIAGNOSIS — Z906 Acquired absence of other parts of urinary tract: Secondary | ICD-10-CM | POA: Diagnosis not present

## 2017-05-22 DIAGNOSIS — C775 Secondary and unspecified malignant neoplasm of intrapelvic lymph nodes: Secondary | ICD-10-CM | POA: Diagnosis not present

## 2017-05-22 DIAGNOSIS — C672 Malignant neoplasm of lateral wall of bladder: Secondary | ICD-10-CM | POA: Diagnosis not present

## 2017-05-22 DIAGNOSIS — R109 Unspecified abdominal pain: Secondary | ICD-10-CM | POA: Diagnosis not present

## 2017-05-22 DIAGNOSIS — I2510 Atherosclerotic heart disease of native coronary artery without angina pectoris: Secondary | ICD-10-CM | POA: Diagnosis not present

## 2017-05-22 DIAGNOSIS — M79604 Pain in right leg: Secondary | ICD-10-CM | POA: Diagnosis not present

## 2017-05-22 DIAGNOSIS — Z906 Acquired absence of other parts of urinary tract: Secondary | ICD-10-CM | POA: Diagnosis not present

## 2017-05-22 DIAGNOSIS — M25551 Pain in right hip: Secondary | ICD-10-CM | POA: Diagnosis not present

## 2017-05-22 DIAGNOSIS — Z8551 Personal history of malignant neoplasm of bladder: Secondary | ICD-10-CM | POA: Diagnosis not present

## 2017-05-22 DIAGNOSIS — G8929 Other chronic pain: Secondary | ICD-10-CM | POA: Diagnosis not present

## 2017-05-22 DIAGNOSIS — Z510 Encounter for antineoplastic radiation therapy: Secondary | ICD-10-CM | POA: Diagnosis not present

## 2017-05-26 ENCOUNTER — Ambulatory Visit: Payer: Commercial Managed Care - HMO | Admitting: Family Medicine

## 2017-05-26 ENCOUNTER — Ambulatory Visit
Admission: RE | Admit: 2017-05-26 | Discharge: 2017-05-26 | Disposition: A | Discharge status: Home / Self Care | Payer: Medicare HMO | Source: Ambulatory Visit | Attending: Family Medicine | Admitting: Family Medicine

## 2017-05-26 ENCOUNTER — Encounter: Payer: Self-pay | Admitting: Family Medicine

## 2017-05-26 ENCOUNTER — Ambulatory Visit (INDEPENDENT_AMBULATORY_CARE_PROVIDER_SITE_OTHER): Payer: Medicare HMO | Admitting: Family Medicine

## 2017-05-26 VITALS — BP 120/68 | HR 98 | Temp 98.2°F | Wt 148.6 lb

## 2017-05-26 DIAGNOSIS — M25561 Pain in right knee: Secondary | ICD-10-CM | POA: Diagnosis not present

## 2017-05-26 DIAGNOSIS — M25551 Pain in right hip: Secondary | ICD-10-CM | POA: Diagnosis not present

## 2017-05-26 DIAGNOSIS — C672 Malignant neoplasm of lateral wall of bladder: Secondary | ICD-10-CM | POA: Diagnosis not present

## 2017-05-26 NOTE — Progress Notes (Signed)
Patient: Terry [name] [facility] Male    DOB: June 06, 1936   81 y.o.   MRN: [id number] Visit Date: 05/26/2017  Today's Provider: [name], PA   Chief Complaint  Patient presents with  . Leg Pain   Subjective:    Leg Pain   Incident onset: 1 year ago. There was no injury mechanism. The pain is present in the right leg and right hip. The quality of the pain is described as aching. The pain has been constant since onset. He reports no foreign bodies present. He has tried acetaminophen for the symptoms. The treatment provided mild relief.   Patient Active Problem List   Diagnosis Date Noted  . History of total cystectomy 08/07/2015  . History of surgical procedure 08/07/2015  . Bladder cancer ([place]) 06/21/2015  . Cancer of lateral wall of urinary bladder ([place]) 04/26/2015  . Malignant neoplasm of lateral wall of urinary bladder ([employer]) 04/26/2015  . Arteriosclerosis of coronary artery 04/24/2015  . Blood pressure elevated 04/24/2015  . HLD (hyperlipidemia) 04/24/2015  . Calculus of kidney 04/24/2015  . Psoriasis 04/24/2015  . Gastric ulcer 04/24/2015  . Pain in soft tissues of limb 04/24/2015  . Abnormal LFTs 04/24/2015  . Benign essential HTN 03/27/2015  . Combined fat and carbohydrate induced hyperlipemia 03/27/2015  . Breathlessness on exertion 03/27/2015   Past Surgical History:  Procedure Laterality Date  . BLADDER SURGERY  06/2015  . deviated nose septum surgery  1970  . HERNIA REPAIR  1980  . stomach ulcer surgery  1994  . vascular stent  2006   Family History  Problem Relation Age of Onset  . Diabetes Mother   . Heart disease Mother   . Hypertension Father   . Alzheimer's disease Father   . Hip fracture Father   . Prostate cancer Paternal Uncle    Allergies  Allergen Reactions  . Triprolidine-Pseudoephedrine Rash and Hives  . Sudafed Pe Cold-Cough  [Phenylephrine-Dm-Gg-Apap] Hives  . Actifed Cold-Allergy  [Chlorpheniramine-Phenylephrine] Rash     Previous  Medications   ASPIRIN 81 MG TABLET    Take 1 tablet by mouth daily.   DIPHENHYDRAMINE-ACETAMINOPHEN (TYLENOL PM) 25-500 MG TABS TABLET    Take 1 tablet by mouth at bedtime as needed.   DOCUSATE SODIUM (COLACE) 100 MG CAPSULE    Take 100 mg by mouth as needed.    MULTIPLE VITAMIN (MULTI-VITAMINS) TABS    Take by mouth.   PRAVASTATIN (PRAVACHOL) 20 MG TABLET    Take 1 tablet (20 mg total) by mouth daily.   SENNA (SENOKOT) 8.6 MG TABLET    Take by mouth as needed.     Review of Systems  Constitutional: Positive for appetite change and fatigue.  Respiratory: Negative.   Cardiovascular: Negative.   Musculoskeletal: Positive for arthralgias and myalgias.    Social History  Substance Use Topics  . Smoking status: Former Smoker    Packs/day: 1.50    Years: 50.00    Types: Cigarettes    Quit date: 06/18/2005  . Smokeless tobacco: Never Used  . Alcohol use No   Objective:   BP 120/68 (BP Location: Right Arm, Patient Position: Sitting, Cuff Size: Normal)   Pulse 98   Temp 98.2 F (36.8 C) (Oral)   Wt 148 lb 9.6 oz (67.4 kg)   SpO2 99%   BMI 23.27 kg/m   Physical Exam  Constitutional: He is oriented to person, place, and time. He appears well-developed and well-nourished. No distress.  HENT:  Head: Normocephalic and  atraumatic.  Right Ear: Hearing normal.  Left Ear: Hearing normal.  Nose: Nose normal.  Eyes: Conjunctivae and lids are normal. Right eye exhibits no discharge. Left eye exhibits no discharge. No scleral icterus.  Pulmonary/Chest: Effort normal. No respiratory distress.  Musculoskeletal: Normal range of motion.  Neurological: He is alert and oriented to person, place, and time.  Skin: Skin is intact. No lesion and no rash noted.  Psychiatric: He has a normal mood and affect. His speech is normal and behavior is normal. Thought content normal.      Assessment & Plan:     1. Right hip pain  Gradually worsening aching pain in the right hip over the past year. No known  injury. Suspect arthritis or pain secondary to bladder cancer with lymphadenopathy. Will get x-ray of hip and knee to rule out further pathology or metastasis. May use moist heat applications and Tylenol. Consider NSAID for arthritis or stronger analgesic. - DG HIP UNILAT WITH PELVIS 2-3 VIEWS RIGHT  2. Acute pain of right knee Worsening over the past year despite treatment of bladder cancer with right external iliac mass and lymphadenopathy. No known injury. Will get x-ray evaluation. - DG Knee Complete 4 Views Right  3. Malignant neoplasm of lateral wall of urinary bladder [facility]) Diagnosed May 2016 and has had a total cystectomy with prostatectomy. Has a RLQ urostomy in good shape without signs of infection. States he has lost 10 lbs in the past 2 months. Had 5 additional radiation treatments the past week that slightly improved the right groin pain. No help with pain in the right hip and knee. CT scan on 04-09-17 shows a right external iliac peripherally enhancing mass that measures 4.6 x 4.0 cm (previously 3.5 x 2.8 cm), right internal iliac lymph node that measures 0.9 cm, (previously 0.7 cm) and right common iliac node is unchanged to slightly decreased, measuring 0.6 cm. Also, multilevel degenerative changes of the spine with unchanged mild asymmetric thickening of the right pelvic sidewall. Continue follow up with oncologist (Dr. Arthur Holms) at [facility] as planned.

## 2017-05-27 NOTE — Progress Notes (Signed)
Advised  ED 

## 2017-06-10 DIAGNOSIS — I10 Essential (primary) hypertension: Secondary | ICD-10-CM | POA: Diagnosis not present

## 2017-06-10 DIAGNOSIS — C672 Malignant neoplasm of lateral wall of bladder: Secondary | ICD-10-CM | POA: Diagnosis not present

## 2017-06-10 DIAGNOSIS — Z888 Allergy status to other drugs, medicaments and biological substances status: Secondary | ICD-10-CM | POA: Diagnosis not present

## 2017-06-10 DIAGNOSIS — I2510 Atherosclerotic heart disease of native coronary artery without angina pectoris: Secondary | ICD-10-CM | POA: Diagnosis not present

## 2017-06-10 DIAGNOSIS — K435 Parastomal hernia without obstruction or  gangrene: Secondary | ICD-10-CM | POA: Diagnosis not present

## 2017-06-10 DIAGNOSIS — Z906 Acquired absence of other parts of urinary tract: Secondary | ICD-10-CM | POA: Diagnosis not present

## 2017-06-10 DIAGNOSIS — C61 Malignant neoplasm of prostate: Secondary | ICD-10-CM | POA: Diagnosis not present

## 2017-06-10 DIAGNOSIS — R319 Hematuria, unspecified: Secondary | ICD-10-CM | POA: Diagnosis not present

## 2017-06-10 DIAGNOSIS — C775 Secondary and unspecified malignant neoplasm of intrapelvic lymph nodes: Secondary | ICD-10-CM | POA: Diagnosis not present

## 2017-06-10 DIAGNOSIS — Z886 Allergy status to analgesic agent status: Secondary | ICD-10-CM | POA: Diagnosis not present

## 2017-06-12 DIAGNOSIS — J432 Centrilobular emphysema: Secondary | ICD-10-CM | POA: Diagnosis not present

## 2017-06-12 DIAGNOSIS — C679 Malignant neoplasm of bladder, unspecified: Secondary | ICD-10-CM | POA: Diagnosis not present

## 2017-06-12 DIAGNOSIS — R1900 Intra-abdominal and pelvic swelling, mass and lump, unspecified site: Secondary | ICD-10-CM | POA: Diagnosis not present

## 2017-06-12 DIAGNOSIS — C672 Malignant neoplasm of lateral wall of bladder: Secondary | ICD-10-CM | POA: Diagnosis not present

## 2017-06-14 DIAGNOSIS — C672 Malignant neoplasm of lateral wall of bladder: Secondary | ICD-10-CM | POA: Diagnosis not present

## 2017-06-14 DIAGNOSIS — Z5112 Encounter for antineoplastic immunotherapy: Secondary | ICD-10-CM | POA: Diagnosis not present

## 2017-07-09 ENCOUNTER — Encounter: Payer: Medicare HMO | Admitting: Family Medicine

## 2017-07-09 ENCOUNTER — Ambulatory Visit: Payer: Medicare HMO

## 2017-07-09 DIAGNOSIS — C672 Malignant neoplasm of lateral wall of bladder: Secondary | ICD-10-CM | POA: Diagnosis not present

## 2017-07-09 DIAGNOSIS — I10 Essential (primary) hypertension: Secondary | ICD-10-CM | POA: Diagnosis not present

## 2017-07-09 DIAGNOSIS — R590 Localized enlarged lymph nodes: Secondary | ICD-10-CM | POA: Diagnosis not present

## 2017-07-09 DIAGNOSIS — Z801 Family history of malignant neoplasm of trachea, bronchus and lung: Secondary | ICD-10-CM | POA: Diagnosis not present

## 2017-07-09 DIAGNOSIS — I2510 Atherosclerotic heart disease of native coronary artery without angina pectoris: Secondary | ICD-10-CM | POA: Diagnosis not present

## 2017-07-09 DIAGNOSIS — Z886 Allergy status to analgesic agent status: Secondary | ICD-10-CM | POA: Diagnosis not present

## 2017-07-09 DIAGNOSIS — C61 Malignant neoplasm of prostate: Secondary | ICD-10-CM | POA: Diagnosis not present

## 2017-07-09 DIAGNOSIS — Z8042 Family history of malignant neoplasm of prostate: Secondary | ICD-10-CM | POA: Diagnosis not present

## 2017-07-09 DIAGNOSIS — Z5112 Encounter for antineoplastic immunotherapy: Secondary | ICD-10-CM | POA: Diagnosis not present

## 2017-07-10 DIAGNOSIS — R109 Unspecified abdominal pain: Secondary | ICD-10-CM | POA: Diagnosis not present

## 2017-07-10 DIAGNOSIS — G8929 Other chronic pain: Secondary | ICD-10-CM | POA: Diagnosis not present

## 2017-07-10 DIAGNOSIS — C775 Secondary and unspecified malignant neoplasm of intrapelvic lymph nodes: Secondary | ICD-10-CM | POA: Diagnosis not present

## 2017-07-10 DIAGNOSIS — M79604 Pain in right leg: Secondary | ICD-10-CM | POA: Diagnosis not present

## 2017-07-10 DIAGNOSIS — Z906 Acquired absence of other parts of urinary tract: Secondary | ICD-10-CM | POA: Diagnosis not present

## 2017-07-10 DIAGNOSIS — I2510 Atherosclerotic heart disease of native coronary artery without angina pectoris: Secondary | ICD-10-CM | POA: Diagnosis not present

## 2017-07-10 DIAGNOSIS — M25551 Pain in right hip: Secondary | ICD-10-CM | POA: Diagnosis not present

## 2017-07-10 DIAGNOSIS — C672 Malignant neoplasm of lateral wall of bladder: Secondary | ICD-10-CM | POA: Diagnosis not present

## 2017-07-10 DIAGNOSIS — Z510 Encounter for antineoplastic radiation therapy: Secondary | ICD-10-CM | POA: Diagnosis not present

## 2017-07-10 DIAGNOSIS — Z8551 Personal history of malignant neoplasm of bladder: Secondary | ICD-10-CM | POA: Diagnosis not present

## 2017-07-14 ENCOUNTER — Telehealth: Payer: Self-pay | Admitting: Family Medicine

## 2017-07-14 NOTE — Telephone Encounter (Signed)
Left message about need to reschedule AWV and CPE-ab

## 2017-07-23 ENCOUNTER — Ambulatory Visit (INDEPENDENT_AMBULATORY_CARE_PROVIDER_SITE_OTHER): Payer: Medicare HMO | Admitting: Family Medicine

## 2017-07-23 ENCOUNTER — Ambulatory Visit (INDEPENDENT_AMBULATORY_CARE_PROVIDER_SITE_OTHER): Payer: Medicare HMO

## 2017-07-23 ENCOUNTER — Encounter: Payer: Self-pay | Admitting: Family Medicine

## 2017-07-23 VITALS — BP 104/62 | HR 92 | Temp 98.6°F | Ht 67.0 in | Wt 151.0 lb

## 2017-07-23 DIAGNOSIS — Z0000 Encounter for general adult medical examination without abnormal findings: Secondary | ICD-10-CM

## 2017-07-23 DIAGNOSIS — Z9889 Other specified postprocedural states: Secondary | ICD-10-CM | POA: Diagnosis not present

## 2017-07-23 DIAGNOSIS — I10 Essential (primary) hypertension: Secondary | ICD-10-CM | POA: Diagnosis not present

## 2017-07-23 DIAGNOSIS — K921 Melena: Secondary | ICD-10-CM

## 2017-07-23 DIAGNOSIS — E782 Mixed hyperlipidemia: Secondary | ICD-10-CM | POA: Diagnosis not present

## 2017-07-23 DIAGNOSIS — C672 Malignant neoplasm of lateral wall of bladder: Secondary | ICD-10-CM

## 2017-07-23 DIAGNOSIS — Z906 Acquired absence of other parts of urinary tract: Secondary | ICD-10-CM

## 2017-07-23 LAB — HEMOCCULT GUIAC POC 1CARD (OFFICE): FECAL OCCULT BLD: POSITIVE — AB

## 2017-07-23 NOTE — Patient Instructions (Signed)
Terry [name] , Thank you for taking time to come for your Medicare Wellness Visit. I appreciate your ongoing commitment to your health goals. Please review the following plan we discussed and let me know if I can assist you in the future.   Screening recommendations/referrals: Colonoscopy: N/A Recommended yearly ophthalmology/optometry visit for glaucoma screening and checkup Recommended yearly dental visit for hygiene and checkup  Vaccinations: Influenza vaccine: due fall 2018 Pneumococcal vaccine: Pneumovax 23 given 08/15/15, denied Prevnar 13 today. Pt to check other medical records first. Tdap vaccine: completed 02/01/13, due 01/2023 Shingles vaccine: declined   Advanced directives: Please bring a copy of your POA (Power of [place]) and/or Living Will to your next appointment.   Conditions/risks identified: Recommend decreasing amount of soda intake. Pt to cut out 1 soda a day.   Next appointment: None, need to schedule 1 year AWV.  Preventive Care 24 Years and Older, Male Preventive care refers to lifestyle choices and visits with your health care provider that can promote health and wellness. What does preventive care include?  A yearly physical exam. This is also called an annual well check.  Dental exams once or twice a year.  Routine eye exams. Ask your health care provider how often you should have your eyes checked.  Personal lifestyle choices, including:  Daily care of your teeth and gums.  Regular physical activity.  Eating a healthy diet.  Avoiding tobacco and drug use.  Limiting alcohol use.  Practicing safe sex.  Taking low doses of aspirin every day.  Taking vitamin and mineral supplements as recommended by your health care provider. What happens during an annual well check? The services and screenings done by your health care provider during your annual well check will depend on your age, overall health, lifestyle risk factors, and family history of  disease. Counseling  Your health care provider may ask you questions about your:  Alcohol use.  Tobacco use.  Drug use.  Emotional well-being.  Home and relationship well-being.  Sexual activity.  Eating habits.  History of falls.  Memory and ability to understand (cognition).  Work and work Statistician. Screening  You may have the following tests or measurements:  Height, weight, and BMI.  Blood pressure.  Lipid and cholesterol levels. These may be checked every 5 years, or more frequently if you are over 60 years old.  Skin check.  Lung cancer screening. You may have this screening every year starting at age 27 if you have a 30-pack-year history of smoking and currently smoke or have quit within the past 15 years.  Fecal occult blood test (FOBT) of the stool. You may have this test every year starting at age 65.  Flexible sigmoidoscopy or colonoscopy. You may have a sigmoidoscopy every 5 years or a colonoscopy every 10 years starting at age 57.  Prostate cancer screening. Recommendations will vary depending on your family history and other risks.  Hepatitis C blood test.  Hepatitis B blood test.  Sexually transmitted disease (STD) testing.  Diabetes screening. This is done by checking your blood sugar (glucose) after you have not eaten for a while (fasting). You may have this done every 1-3 years.  Abdominal aortic aneurysm (AAA) screening. You may need this if you are a current or former smoker.  Osteoporosis. You may be screened starting at age 6 if you are at high risk. Talk with your health care provider about your test results, treatment options, and if necessary, the need for more tests. Vaccines  Your health care provider may recommend certain vaccines, such as:  Influenza vaccine. This is recommended every year.  Tetanus, diphtheria, and acellular pertussis (Tdap, Td) vaccine. You may need a Td booster every 10 years.  Zoster vaccine. You may  need this after age 54.  Pneumococcal 13-valent conjugate (PCV13) vaccine. One dose is recommended after age 45.  Pneumococcal polysaccharide (PPSV23) vaccine. One dose is recommended after age 101. Talk to your health care provider about which screenings and vaccines you need and how often you need them. This information is not intended to replace advice given to you by your health care provider. Make sure you discuss any questions you have with your health care provider. Document Released: 12/28/2015 Document Revised: 08/20/2016 Document Reviewed: 10/02/2015 Elsevier Interactive Patient Education  2017 [place] Prevention in the Home Falls can cause injuries. They can happen to people of all ages. There are many things you can do to make your home safe and to help prevent falls. What can I do on the outside of my home?  Regularly fix the edges of walkways and driveways and fix any cracks.  Remove anything that might make you trip as you walk through a door, such as a raised step or threshold.  Trim any bushes or trees on the path to your home.  Use bright outdoor lighting.  Clear any walking paths of anything that might make someone trip, such as rocks or tools.  Regularly check to see if handrails are loose or broken. Make sure that both sides of any steps have handrails.  Any raised decks and porches should have guardrails on the edges.  Have any leaves, snow, or ice cleared regularly.  Use sand or salt on walking paths during winter.  Clean up any spills in your garage right away. This includes oil or grease spills. What can I do in the bathroom?  Use night lights.  Install grab bars by the toilet and in the tub and shower. Do not use towel bars as grab bars.  Use non-skid mats or decals in the tub or shower.  If you need to sit down in the shower, use a plastic, non-slip stool.  Keep the floor dry. Clean up any water that spills on the floor as soon as it  happens.  Remove soap buildup in the tub or shower regularly.  Attach bath mats securely with double-sided non-slip rug tape.  Do not have throw rugs and other things on the floor that can make you trip. What can I do in the bedroom?  Use night lights.  Make sure that you have a light by your bed that is easy to reach.  Do not use any sheets or blankets that are too big for your bed. They should not hang down onto the floor.  Have a firm chair that has side arms. You can use this for support while you get dressed.  Do not have throw rugs and other things on the floor that can make you trip. What can I do in the kitchen?  Clean up any spills right away.  Avoid walking on wet floors.  Keep items that you use a lot in easy-to-reach places.  If you need to reach something above you, use a strong step stool that has a grab bar.  Keep electrical cords out of the way.  Do not use floor polish or wax that makes floors slippery. If you must use wax, use non-skid floor wax.  Do  not have throw rugs and other things on the floor that can make you trip. What can I do with my stairs?  Do not leave any items on the stairs.  Make sure that there are handrails on both sides of the stairs and use them. Fix handrails that are broken or loose. Make sure that handrails are as long as the stairways.  Check any carpeting to make sure that it is firmly attached to the stairs. Fix any carpet that is loose or worn.  Avoid having throw rugs at the top or bottom of the stairs. If you do have throw rugs, attach them to the floor with carpet tape.  Make sure that you have a light switch at the top of the stairs and the bottom of the stairs. If you do not have them, ask someone to add them for you. What else can I do to help prevent falls?  Wear shoes that:  Do not have high heels.  Have rubber bottoms.  Are comfortable and fit you well.  Are closed at the toe. Do not wear sandals.  If you  use a stepladder:  Make sure that it is fully opened. Do not climb a closed stepladder.  Make sure that both sides of the stepladder are locked into place.  Ask someone to hold it for you, if possible.  Clearly mark and make sure that you can see:  Any grab bars or handrails.  First and last steps.  Where the edge of each step is.  Use tools that help you move around (mobility aids) if they are needed. These include:  Canes.  Walkers.  Scooters.  Crutches.  Turn on the lights when you go into a dark area. Replace any light bulbs as soon as they burn out.  Set up your furniture so you have a clear path. Avoid moving your furniture around.  If any of your floors are uneven, fix them.  If there are any pets around you, be aware of where they are.  Review your medicines with your doctor. Some medicines can make you feel dizzy. This can increase your chance of falling. Ask your doctor what other things that you can do to help prevent falls. This information is not intended to replace advice given to you by your health care provider. Make sure you discuss any questions you have with your health care provider. Document Released: 09/27/2009 Document Revised: 05/08/2016 Document Reviewed: 01/05/2015 Elsevier Interactive Patient Education  2017 [employer].

## 2017-07-23 NOTE — Progress Notes (Signed)
Subjective:   Terry [name] is a 81 y.o. male who presents for Medicare Annual/Subsequent preventive examination.  Review of Systems:  N/A  Cardiac Risk Factors include: advanced age (>61men, >13 women);dyslipidemia;hypertension;male gender     Objective:    Vitals: BP 104/62 (BP Location: Left Arm)   Pulse 92   Temp 98.6 F (37 C) (Oral)   Ht 5\' 7"  (1.702 m)   Wt 151 lb (68.5 kg)   BMI 23.65 kg/m   Body mass index is 23.65 kg/m.  Tobacco History  Smoking Status  . Former Smoker  . Packs/day: 1.50  . Years: 50.00  . Types: Cigarettes  . Quit date: 06/18/2005  Smokeless Tobacco  . Never Used     Counseling given: Not Answered   Past Medical History:  Diagnosis Date  . Hyperlipidemia   . Hypertension    Past Surgical History:  Procedure Laterality Date  . BLADDER SURGERY  06/2015  . deviated nose septum surgery  1970  . HERNIA REPAIR  1980  . stomach ulcer surgery  1994  . vascular stent  2006   Family History  Problem Relation Age of Onset  . Diabetes Mother   . Heart disease Mother   . Hypertension Father   . Alzheimer's disease Father   . Hip fracture Father   . Prostate cancer Paternal Uncle    History  Sexual Activity  . Sexual activity: Not on file    Outpatient Encounter Prescriptions as of 07/23/2017  Medication Sig  . acetaminophen (TYLENOL) 325 MG tablet Take 650 mg by mouth every 6 (six) hours as needed.  Marland Kitchen aspirin 81 MG tablet Take 1 tablet by mouth daily.  . cetirizine (ZYRTEC) 10 MG tablet Take 10 mg by mouth daily.  . diphenhydramine-acetaminophen (TYLENOL PM) 25-500 MG TABS tablet Take 1 tablet by mouth at bedtime as needed.  . docusate sodium (COLACE) 100 MG capsule Take 100 mg by mouth as needed.   . famotidine (PEPCID) 20 MG tablet Take 20 mg by mouth daily.  . Multiple Vitamin (MULTI-VITAMINS) TABS Take by mouth.  . neomycin-bacitracin-polymyxin (NEOSPORIN) [phone] ointment Apply 1 application topically as needed.  .  pravastatin (PRAVACHOL) 20 MG tablet Take 1 tablet (20 mg total) by mouth daily.  Marland Kitchen senna (SENOKOT) 8.6 MG tablet Take by mouth as needed.   . triamcinolone cream (KENALOG) 0.1 % Apply 1 application topically 2 (two) times daily.    No facility-administered encounter medications on file as of 07/23/2017.     Activities of Daily Living In your present state of health, do you have any difficulty performing the following activities: 07/23/2017  Hearing? Y  Vision? Y  Comment without glasses  Difficulty concentrating or making decisions? N  Walking or climbing stairs? N  Dressing or bathing? N  Doing errands, shopping? N  Preparing Food and eating ? N  Using the Toilet? N  In the past six months, have you accidently leaked urine? N  Comment bladder removed  Do you have problems with loss of bowel control? N  Managing your Medications? N  Managing your Finances? N  Housekeeping or managing your Housekeeping? N  Some recent data might be hidden    Patient Care Team: Chrismon, Vickki Muff, PA as PCP - General (Physician Assistant) Sidney Ace, NP as Nurse Practitioner (Surgical Oncology)   Assessment:     Exercise Activities and Dietary recommendations Current Exercise Habits: The patient does not participate in regular exercise at present (yardwork and  house work only), Exercise limited by: None identified  Goals    . decrease soda intake          Recommend decreasing amount of soda intake. Pt to cut out 1 soda a day.       Fall Risk Fall Risk  07/23/2017 07/03/2016 06/21/2015  Falls in the past year? No No No   Depression Screen PHQ 2/9 Scores 07/23/2017 07/23/2017 07/03/2016 06/21/2015  PHQ - 2 Score 0 0 0 0  PHQ- 9 Score 2 - - -    Cognitive Function- Pt declined screening today.         Immunization History  Administered Date(s) Administered  . Pneumococcal Polysaccharide-23 08/15/2015  . Tdap 02/01/2013   Screening Tests Health Maintenance  Topic Date Due  .  INFLUENZA VACCINE  07/15/2017  . PNA vac Low Risk Adult (2 of 2 - PCV13) 07/15/2018 (Originally 08/14/2016)  . TETANUS/TDAP  02/01/2023      Plan:  I have personally reviewed and addressed the Medicare Annual Wellness questionnaire and have noted the following in the patient's chart:  A. Medical and social history B. Use of alcohol, tobacco or illicit drugs  C. Current medications and supplements D. Functional ability and status E.  Nutritional status F.  Physical activity G. Advance directives H. List of other physicians I.  Hospitalizations, surgeries, and ER visits in previous 12 months J.  [place] such as hearing and vision if needed, cognitive and depression L. Referrals and appointments - none  In addition, I have reviewed and discussed with patient certain preventive protocols, quality metrics, and best practice recommendations. A written personalized care plan for preventive services as well as general preventive health recommendations were provided to patient.  See attached scanned questionnaire for additional information.   Signed,  Fabio Neighbors, LPN Nurse Health Advisor   MD Recommendations: None, pt declined Prevnar 13 vaccine today. Pt to check records from [facility] to see if received there and he will then f/u with [place].  Reviewed Nurse Health Advisor's documentation and recommendations. Available for consultation and agree with recommendations.

## 2017-07-23 NOTE — Progress Notes (Signed)
Patient: Terry [name], Male    DOB: 1936/08/28, 81 y.o.   MRN: [id number] Visit Date: 07/23/2017  Today's Provider: [name], PA   Chief Complaint  Patient presents with  . Annual wellness exam   Subjective:    Annual wellness visit Terry [name] is a 81 y.o. male. He feels well. He reports exercising  . He reports he is sleeping well.  Colonoscopy-?     Review of Systems  Constitutional: Positive for fatigue.  HENT: Negative.   Eyes: Negative.   Respiratory: Negative.   Cardiovascular: Negative.   Gastrointestinal: Positive for abdominal pain and diarrhea.  Endocrine: Negative.   Genitourinary: Negative.   Musculoskeletal: Negative.   Skin: Negative.   Allergic/Immunologic: Negative.   Neurological: Negative.   Hematological: Negative.   Psychiatric/Behavioral: Negative.     Social History   Social History  . Marital status: Married    Spouse name: N/A  . Number of children: N/A  . Years of education: N/A   Occupational History  . Not on file.   Social History Main Topics  . Smoking status: Former Smoker    Packs/day: 1.50    Years: 50.00    Types: Cigarettes    Quit date: 06/18/2005  . Smokeless tobacco: Never Used  . Alcohol use No  . Drug use: No  . Sexual activity: Not on file   Other Topics Concern  . Not on file   Social History Narrative  . No narrative on file    Past Medical History:  Diagnosis Date  . Hyperlipidemia   . Hypertension      Patient Active Problem List   Diagnosis Date Noted  . History of total cystectomy 08/07/2015  . History of surgical procedure 08/07/2015  . Bladder cancer ([place]) 06/21/2015  . Cancer of lateral wall of urinary bladder ([place]) 04/26/2015  . Malignant neoplasm of lateral wall of urinary bladder ([place]) 04/26/2015  . Arteriosclerosis of coronary artery 04/24/2015  . Blood pressure elevated 04/24/2015  . HLD (hyperlipidemia) 04/24/2015  . Calculus of kidney 04/24/2015  .  Psoriasis 04/24/2015  . Gastric ulcer 04/24/2015  . Pain in soft tissues of limb 04/24/2015  . Abnormal LFTs 04/24/2015  . Benign essential HTN 03/27/2015  . Combined fat and carbohydrate induced hyperlipemia 03/27/2015  . Breathlessness on exertion 03/27/2015    Past Surgical History:  Procedure Laterality Date  . BLADDER SURGERY  06/2015  . deviated nose septum surgery  1970  . EP IMPLANTABLE DEVICE    . HERNIA REPAIR  1980  . single septum port    . stomach ulcer surgery  1994  . vascular stent  2006    His family history includes Alzheimer's disease in his father; Diabetes in his mother; Heart disease in his mother; Hip fracture in his father; Hypertension in his father; Prostate cancer in his paternal uncle.      Current Outpatient Prescriptions:  .  acetaminophen (TYLENOL) 325 MG tablet, Take 650 mg by mouth every 6 (six) hours as needed., Disp: , Rfl:  .  aspirin 81 MG tablet, Take 1 tablet by mouth daily., Disp: , Rfl:  .  cetirizine (ZYRTEC) 10 MG tablet, Take 10 mg by mouth daily., Disp: , Rfl:  .  diphenhydramine-acetaminophen (TYLENOL PM) 25-500 MG TABS tablet, Take 1 tablet by mouth at bedtime as needed., Disp: , Rfl:  .  docusate sodium (COLACE) 100 MG capsule, Take 100 mg by mouth as needed. , Disp: ,  Rfl:  .  famotidine (PEPCID) 20 MG tablet, Take 20 mg by mouth daily., Disp: , Rfl:  .  Multiple Vitamin (MULTI-VITAMINS) TABS, Take by mouth., Disp: , Rfl:  .  neomycin-bacitracin-polymyxin (NEOSPORIN) [phone] ointment, Apply 1 application topically as needed., Disp: , Rfl:  .  pravastatin (PRAVACHOL) 20 MG tablet, Take 1 tablet (20 mg total) by mouth daily., Disp: 90 tablet, Rfl: 3 .  senna (SENOKOT) 8.6 MG tablet, Take by mouth as needed. , Disp: , Rfl:  .  triamcinolone cream (KENALOG) 0.1 %, Apply 1 application topically 2 (two) times daily. , Disp: , Rfl:   Patient Care Team: Tasnia Spegal, Vickki Muff, PA as PCP - General (Physician Assistant) Sidney Ace, NP  as Nurse Practitioner (Surgical Oncology)     Objective:   Vitals:  BP  104/62 (BP Location: Left Arm)     Pulse  92     Temp  98.6 F (37 C) (Oral)     Ht  5\' 7"  (1.702 m)     Wt  151 lb (68.5 kg)      BMI  23.65 kg/m     Wt Readings from Last 3 Encounters:  07/23/17 151 lb (68.5 kg)  05/26/17 148 lb 9.6 oz (67.4 kg)  08/19/16 154 lb 9.6 oz (70.1 kg)    Physical Exam  Constitutional: He is oriented to person, place, and time. He appears well-developed and well-nourished.  HENT:  Head: Normocephalic.  Eyes: Conjunctivae are normal.  Neck: Neck supple.  Cardiovascular: Normal rate and regular rhythm.   Pulmonary/Chest: Effort normal and breath sounds normal.  Abdominal: Soft. Bowel sounds are normal.  Cystostomy in the right lower quadrant of abdomen. Urine clear in collection bag.  Genitourinary: Penis normal. Rectal exam shows guaiac positive stool.  Genitourinary Comments: No mass or tenderness DRE. Stool melenotic.  Musculoskeletal: Normal range of motion.  Lymphadenopathy:    He has no cervical adenopathy.  Neurological: He is alert and oriented to person, place, and time.  Skin: No rash noted.  Psychiatric: He has a normal mood and affect. His behavior is normal.    Activities of Daily Living In your present state of health, do you have any difficulty performing the following activities: 07/23/2017  Hearing? Y  Vision? Y  Comment without glasses  Difficulty concentrating or making decisions? N  Walking or climbing stairs? N  Dressing or bathing? N  Doing errands, shopping? N  Preparing Food and eating ? N  Using the Toilet? N  In the past six months, have you accidently leaked urine? N  Comment bladder removed  Do you have problems with loss of bowel control? N  Managing your Medications? N  Managing your Finances? N  Housekeeping or managing your Housekeeping? N  Some recent data might be hidden    Fall Risk Assessment Fall Risk  07/23/2017  07/03/2016 06/21/2015  Falls in the past year? No No No   Depression Screen PHQ 2/9 Scores 07/23/2017 07/23/2017 07/03/2016 06/21/2015  PHQ - 2 Score 0 0 0 0  PHQ- 9 Score 2 - - -   Assessment & Plan:     Annual Wellness Visit  Reviewed patient's Family Medical History Reviewed and updated list of patient's medical providers Assessment of cognitive impairment was done Assessed patient's functional ability Established a written schedule for health screening [place] Completed and Reviewed  Exercise Activities and Dietary recommendations Goals    . decrease soda intake  Recommend decreasing amount of soda intake. Pt to cut out 1 soda a day.        Immunization History  Administered Date(s) Administered  . Pneumococcal Polysaccharide-23 08/15/2015  . Tdap 02/01/2013    Health Maintenance  Topic Date Due  . INFLUENZA VACCINE  07/15/2017  . PNA vac Low Risk Adult (2 of 2 - PCV13) 07/15/2018 (Originally 08/14/2016)  . TETANUS/TDAP  02/01/2023     Discussed health benefits of physical activity, and encouraged him to engage in regular exercise appropriate for his age and condition.    1. Melena Onset 07-20-17 with some diarrhea and feeling fatigued. OC-Light test positive today. Will get CBC and CMP. Will contact his oncology clinic at [facility] when results come back tomorrow morning to see about scheduling colonoscopy or GI evaluation. - CBC with Differential/Platelet - Comprehensive metabolic panel - POCT Occult Blood Stool  2. Benign essential HTN Well controlled without antihypertensive medications now. - CBC with Differential/Platelet - Comprehensive metabolic panel  3. Malignant neoplasm of lateral wall of urinary bladder ([place]) Getting Keytruda by right upper chest port every 3 weeks. Followed by [facility]. Last infusion 07-09-17. Continue regular follow up there. - CBC with Differential/Platelet - Comprehensive metabolic panel  4.  Combined fat and carbohydrate induced hyperlipemia Still taking the Pravastatin 20 mg qd to control cholesterol levels. Will recheck labs and follow up pending report. - Comprehensive metabolic panel - Lipid panel  5. History of total cystectomy Secondary to bladder wall cancer and has a right lower quadrant ileal conduit. No discomfort or blood in urine collection bag today.     [name], PA  [facility]

## 2017-07-24 ENCOUNTER — Other Ambulatory Visit: Payer: Self-pay | Admitting: Family Medicine

## 2017-07-24 DIAGNOSIS — K921 Melena: Secondary | ICD-10-CM

## 2017-07-24 LAB — LIPID PANEL
CHOLESTEROL TOTAL: 171 mg/dL (ref 100–199)
Chol/HDL Ratio: 3.4 ratio (ref 0.0–5.0)
HDL: 50 mg/dL (ref >39)
LDL Calculated: 108 mg/dL — ABNORMAL HIGH (ref 0–99)
Triglycerides: 65 mg/dL (ref 0–149)
VLDL CHOLESTEROL CAL: 13 mg/dL (ref 5–40)

## 2017-07-24 LAB — CBC WITH DIFFERENTIAL/PLATELET
BASOS ABS: 0.1 10*3/uL (ref 0.0–0.2)
BASOS: 1 %
EOS (ABSOLUTE): 0.2 10*3/uL (ref 0.0–0.4)
Eos: 3 %
Hematocrit: 33.6 % — ABNORMAL LOW (ref 37.5–51.0)
Hemoglobin: 10.6 g/dL — ABNORMAL LOW (ref 13.0–17.7)
Immature Grans (Abs): 0.1 10*3/uL (ref 0.0–0.1)
Immature Granulocytes: 1 %
LYMPHS ABS: 1.4 10*3/uL (ref 0.7–3.1)
LYMPHS: 15 %
MCH: 27 pg (ref 26.6–33.0)
MCHC: 31.5 g/dL (ref 31.5–35.7)
MCV: 86 fL (ref 79–97)
MONOS ABS: 0.8 10*3/uL (ref 0.1–0.9)
Monocytes: 8 %
NEUTROS ABS: 6.7 10*3/uL (ref 1.4–7.0)
Neutrophils: 72 %
PLATELETS: 280 10*3/uL (ref 150–379)
RBC: 3.93 x10E6/uL — ABNORMAL LOW (ref 4.14–5.80)
RDW: 14.9 % (ref 12.3–15.4)
WBC: 9.2 10*3/uL (ref 3.4–10.8)

## 2017-07-24 LAB — COMPREHENSIVE METABOLIC PANEL
A/G RATIO: 2 (ref 1.2–2.2)
ALK PHOS: 93 IU/L (ref 39–117)
ALT: 14 IU/L (ref 0–44)
AST: 21 IU/L (ref 0–40)
Albumin: 4.5 g/dL (ref 3.5–4.8)
BILIRUBIN TOTAL: 0.3 mg/dL (ref 0.0–1.2)
BUN/Creatinine Ratio: 20 (ref 10–24)
BUN: 22 mg/dL (ref 8–27)
CHLORIDE: 102 mmol/L (ref 96–106)
CO2: 21 mmol/L (ref 20–29)
Calcium: 9.5 mg/dL (ref 8.6–10.2)
Creatinine, Ser: 1.09 mg/dL (ref 0.76–1.27)
GFR calc non Af Amer: 65 mL/min/{1.73_m2} (ref >59)
GFR, EST AFRICAN AMERICAN: 75 mL/min/{1.73_m2} (ref >59)
GLUCOSE: 80 mg/dL (ref 65–99)
Globulin, Total: 2.3 g/dL (ref 1.5–4.5)
POTASSIUM: 4.7 mmol/L (ref 3.5–5.2)
Sodium: 140 mmol/L (ref 134–144)
TOTAL PROTEIN: 6.8 g/dL (ref 6.0–8.5)

## 2017-07-31 DIAGNOSIS — K921 Melena: Secondary | ICD-10-CM | POA: Diagnosis not present

## 2017-07-31 DIAGNOSIS — I10 Essential (primary) hypertension: Secondary | ICD-10-CM | POA: Diagnosis not present

## 2017-07-31 DIAGNOSIS — K639 Disease of intestine, unspecified: Secondary | ICD-10-CM | POA: Diagnosis not present

## 2017-07-31 DIAGNOSIS — Z8711 Personal history of peptic ulcer disease: Secondary | ICD-10-CM | POA: Diagnosis not present

## 2017-07-31 DIAGNOSIS — L409 Psoriasis, unspecified: Secondary | ICD-10-CM | POA: Diagnosis not present

## 2017-07-31 DIAGNOSIS — Z888 Allergy status to other drugs, medicaments and biological substances status: Secondary | ICD-10-CM | POA: Diagnosis not present

## 2017-07-31 DIAGNOSIS — I2510 Atherosclerotic heart disease of native coronary artery without angina pectoris: Secondary | ICD-10-CM | POA: Diagnosis not present

## 2017-07-31 DIAGNOSIS — Z8551 Personal history of malignant neoplasm of bladder: Secondary | ICD-10-CM | POA: Diagnosis not present

## 2017-07-31 DIAGNOSIS — K5730 Diverticulosis of large intestine without perforation or abscess without bleeding: Secondary | ICD-10-CM | POA: Diagnosis not present

## 2017-07-31 DIAGNOSIS — E785 Hyperlipidemia, unspecified: Secondary | ICD-10-CM | POA: Diagnosis not present

## 2017-08-05 LAB — HM COLONOSCOPY

## 2017-08-06 DIAGNOSIS — C672 Malignant neoplasm of lateral wall of bladder: Secondary | ICD-10-CM | POA: Diagnosis not present

## 2017-08-06 DIAGNOSIS — Z5112 Encounter for antineoplastic immunotherapy: Secondary | ICD-10-CM | POA: Diagnosis not present

## 2017-08-06 DIAGNOSIS — Z79899 Other long term (current) drug therapy: Secondary | ICD-10-CM | POA: Diagnosis not present

## 2017-08-11 ENCOUNTER — Other Ambulatory Visit: Payer: Self-pay | Admitting: Family Medicine

## 2017-08-11 DIAGNOSIS — E785 Hyperlipidemia, unspecified: Secondary | ICD-10-CM

## 2017-08-25 DIAGNOSIS — N132 Hydronephrosis with renal and ureteral calculous obstruction: Secondary | ICD-10-CM | POA: Diagnosis not present

## 2017-08-25 DIAGNOSIS — C672 Malignant neoplasm of lateral wall of bladder: Secondary | ICD-10-CM | POA: Diagnosis not present

## 2017-08-27 DIAGNOSIS — C799 Secondary malignant neoplasm of unspecified site: Secondary | ICD-10-CM | POA: Diagnosis not present

## 2017-08-27 DIAGNOSIS — Z5112 Encounter for antineoplastic immunotherapy: Secondary | ICD-10-CM | POA: Diagnosis not present

## 2017-08-27 DIAGNOSIS — C61 Malignant neoplasm of prostate: Secondary | ICD-10-CM | POA: Diagnosis not present

## 2017-08-27 DIAGNOSIS — Z888 Allergy status to other drugs, medicaments and biological substances status: Secondary | ICD-10-CM | POA: Diagnosis not present

## 2017-08-27 DIAGNOSIS — I10 Essential (primary) hypertension: Secondary | ICD-10-CM | POA: Diagnosis not present

## 2017-08-27 DIAGNOSIS — I2510 Atherosclerotic heart disease of native coronary artery without angina pectoris: Secondary | ICD-10-CM | POA: Diagnosis not present

## 2017-08-27 DIAGNOSIS — R590 Localized enlarged lymph nodes: Secondary | ICD-10-CM | POA: Diagnosis not present

## 2017-08-27 DIAGNOSIS — C672 Malignant neoplasm of lateral wall of bladder: Secondary | ICD-10-CM | POA: Diagnosis not present

## 2017-08-27 DIAGNOSIS — Z886 Allergy status to analgesic agent status: Secondary | ICD-10-CM | POA: Diagnosis not present

## 2017-09-13 DIAGNOSIS — R509 Fever, unspecified: Secondary | ICD-10-CM | POA: Diagnosis not present

## 2017-09-13 DIAGNOSIS — R05 Cough: Secondary | ICD-10-CM | POA: Diagnosis not present

## 2017-09-13 DIAGNOSIS — N390 Urinary tract infection, site not specified: Secondary | ICD-10-CM | POA: Diagnosis not present

## 2017-09-17 DIAGNOSIS — Z8042 Family history of malignant neoplasm of prostate: Secondary | ICD-10-CM | POA: Diagnosis not present

## 2017-09-17 DIAGNOSIS — Z87891 Personal history of nicotine dependence: Secondary | ICD-10-CM | POA: Diagnosis not present

## 2017-09-17 DIAGNOSIS — C672 Malignant neoplasm of lateral wall of bladder: Secondary | ICD-10-CM | POA: Diagnosis not present

## 2017-09-17 DIAGNOSIS — Z801 Family history of malignant neoplasm of trachea, bronchus and lung: Secondary | ICD-10-CM | POA: Diagnosis not present

## 2017-09-17 DIAGNOSIS — Z23 Encounter for immunization: Secondary | ICD-10-CM | POA: Diagnosis not present

## 2017-09-17 DIAGNOSIS — I2510 Atherosclerotic heart disease of native coronary artery without angina pectoris: Secondary | ICD-10-CM | POA: Diagnosis not present

## 2017-09-17 DIAGNOSIS — N390 Urinary tract infection, site not specified: Secondary | ICD-10-CM | POA: Diagnosis not present

## 2017-09-17 DIAGNOSIS — Z7982 Long term (current) use of aspirin: Secondary | ICD-10-CM | POA: Diagnosis not present

## 2017-09-17 DIAGNOSIS — I10 Essential (primary) hypertension: Secondary | ICD-10-CM | POA: Diagnosis not present

## 2017-09-21 DIAGNOSIS — N3000 Acute cystitis without hematuria: Secondary | ICD-10-CM | POA: Diagnosis not present

## 2017-09-21 DIAGNOSIS — N136 Pyonephrosis: Secondary | ICD-10-CM | POA: Diagnosis not present

## 2017-09-21 DIAGNOSIS — C61 Malignant neoplasm of prostate: Secondary | ICD-10-CM | POA: Diagnosis not present

## 2017-09-21 DIAGNOSIS — I10 Essential (primary) hypertension: Secondary | ICD-10-CM | POA: Diagnosis not present

## 2017-09-21 DIAGNOSIS — Z888 Allergy status to other drugs, medicaments and biological substances status: Secondary | ICD-10-CM | POA: Diagnosis not present

## 2017-09-21 DIAGNOSIS — R590 Localized enlarged lymph nodes: Secondary | ICD-10-CM | POA: Diagnosis not present

## 2017-09-21 DIAGNOSIS — I2510 Atherosclerotic heart disease of native coronary artery without angina pectoris: Secondary | ICD-10-CM | POA: Diagnosis not present

## 2017-09-21 DIAGNOSIS — Z886 Allergy status to analgesic agent status: Secondary | ICD-10-CM | POA: Diagnosis not present

## 2017-09-21 DIAGNOSIS — C679 Malignant neoplasm of bladder, unspecified: Secondary | ICD-10-CM | POA: Diagnosis not present

## 2017-09-21 DIAGNOSIS — C672 Malignant neoplasm of lateral wall of bladder: Secondary | ICD-10-CM | POA: Diagnosis not present

## 2017-09-21 DIAGNOSIS — Z859 Personal history of malignant neoplasm, unspecified: Secondary | ICD-10-CM | POA: Diagnosis not present

## 2017-09-21 DIAGNOSIS — K913 Postprocedural intestinal obstruction, unspecified as to partial versus complete: Secondary | ICD-10-CM | POA: Diagnosis not present

## 2017-10-01 DIAGNOSIS — I10 Essential (primary) hypertension: Secondary | ICD-10-CM | POA: Diagnosis not present

## 2017-10-01 DIAGNOSIS — Z79899 Other long term (current) drug therapy: Secondary | ICD-10-CM | POA: Diagnosis not present

## 2017-10-01 DIAGNOSIS — N390 Urinary tract infection, site not specified: Secondary | ICD-10-CM | POA: Diagnosis not present

## 2017-10-01 DIAGNOSIS — N132 Hydronephrosis with renal and ureteral calculous obstruction: Secondary | ICD-10-CM | POA: Diagnosis not present

## 2017-10-01 DIAGNOSIS — C672 Malignant neoplasm of lateral wall of bladder: Secondary | ICD-10-CM | POA: Diagnosis not present

## 2017-10-07 DIAGNOSIS — N132 Hydronephrosis with renal and ureteral calculous obstruction: Secondary | ICD-10-CM | POA: Diagnosis not present

## 2017-10-07 DIAGNOSIS — N200 Calculus of kidney: Secondary | ICD-10-CM | POA: Diagnosis not present

## 2017-10-07 DIAGNOSIS — R1909 Other intra-abdominal and pelvic swelling, mass and lump: Secondary | ICD-10-CM | POA: Diagnosis not present

## 2017-10-08 DIAGNOSIS — N200 Calculus of kidney: Secondary | ICD-10-CM | POA: Diagnosis not present

## 2017-10-26 DIAGNOSIS — C61 Malignant neoplasm of prostate: Secondary | ICD-10-CM | POA: Diagnosis not present

## 2017-10-26 DIAGNOSIS — R109 Unspecified abdominal pain: Secondary | ICD-10-CM | POA: Diagnosis not present

## 2017-10-26 DIAGNOSIS — Z923 Personal history of irradiation: Secondary | ICD-10-CM | POA: Diagnosis not present

## 2017-10-26 DIAGNOSIS — I2510 Atherosclerotic heart disease of native coronary artery without angina pectoris: Secondary | ICD-10-CM | POA: Diagnosis not present

## 2017-10-26 DIAGNOSIS — I10 Essential (primary) hypertension: Secondary | ICD-10-CM | POA: Diagnosis not present

## 2017-10-26 DIAGNOSIS — N200 Calculus of kidney: Secondary | ICD-10-CM | POA: Diagnosis not present

## 2017-10-26 DIAGNOSIS — C672 Malignant neoplasm of lateral wall of bladder: Secondary | ICD-10-CM | POA: Diagnosis not present

## 2017-10-26 DIAGNOSIS — C775 Secondary and unspecified malignant neoplasm of intrapelvic lymph nodes: Secondary | ICD-10-CM | POA: Diagnosis not present

## 2017-10-26 DIAGNOSIS — Z5112 Encounter for antineoplastic immunotherapy: Secondary | ICD-10-CM | POA: Diagnosis not present

## 2017-10-27 DIAGNOSIS — N200 Calculus of kidney: Secondary | ICD-10-CM | POA: Diagnosis not present

## 2017-10-27 DIAGNOSIS — N132 Hydronephrosis with renal and ureteral calculous obstruction: Secondary | ICD-10-CM | POA: Diagnosis not present

## 2017-10-27 DIAGNOSIS — Z7982 Long term (current) use of aspirin: Secondary | ICD-10-CM | POA: Diagnosis not present

## 2017-10-27 DIAGNOSIS — Z8551 Personal history of malignant neoplasm of bladder: Secondary | ICD-10-CM | POA: Diagnosis not present

## 2017-10-27 DIAGNOSIS — Z9079 Acquired absence of other genital organ(s): Secondary | ICD-10-CM | POA: Diagnosis not present

## 2017-10-27 DIAGNOSIS — Z79899 Other long term (current) drug therapy: Secondary | ICD-10-CM | POA: Diagnosis not present

## 2017-10-27 DIAGNOSIS — Z936 Other artificial openings of urinary tract status: Secondary | ICD-10-CM | POA: Diagnosis not present

## 2017-11-10 DIAGNOSIS — N132 Hydronephrosis with renal and ureteral calculous obstruction: Secondary | ICD-10-CM | POA: Diagnosis not present

## 2017-11-10 DIAGNOSIS — N200 Calculus of kidney: Secondary | ICD-10-CM | POA: Diagnosis not present

## 2017-11-10 DIAGNOSIS — E785 Hyperlipidemia, unspecified: Secondary | ICD-10-CM | POA: Diagnosis not present

## 2017-11-10 DIAGNOSIS — I2510 Atherosclerotic heart disease of native coronary artery without angina pectoris: Secondary | ICD-10-CM | POA: Diagnosis not present

## 2017-11-10 DIAGNOSIS — I10 Essential (primary) hypertension: Secondary | ICD-10-CM | POA: Diagnosis not present

## 2017-11-11 DIAGNOSIS — Z936 Other artificial openings of urinary tract status: Secondary | ICD-10-CM | POA: Diagnosis not present

## 2017-11-11 DIAGNOSIS — C7911 Secondary malignant neoplasm of bladder: Secondary | ICD-10-CM | POA: Diagnosis not present

## 2017-11-16 DIAGNOSIS — I10 Essential (primary) hypertension: Secondary | ICD-10-CM | POA: Diagnosis not present

## 2017-11-16 DIAGNOSIS — Z8551 Personal history of malignant neoplasm of bladder: Secondary | ICD-10-CM | POA: Diagnosis not present

## 2017-11-16 DIAGNOSIS — G8918 Other acute postprocedural pain: Secondary | ICD-10-CM | POA: Diagnosis not present

## 2017-11-16 DIAGNOSIS — Z955 Presence of coronary angioplasty implant and graft: Secondary | ICD-10-CM | POA: Diagnosis not present

## 2017-11-16 DIAGNOSIS — I2510 Atherosclerotic heart disease of native coronary artery without angina pectoris: Secondary | ICD-10-CM | POA: Diagnosis not present

## 2017-11-16 DIAGNOSIS — N202 Calculus of kidney with calculus of ureter: Secondary | ICD-10-CM | POA: Diagnosis not present

## 2017-11-16 DIAGNOSIS — E785 Hyperlipidemia, unspecified: Secondary | ICD-10-CM | POA: Diagnosis not present

## 2017-11-16 DIAGNOSIS — Z888 Allergy status to other drugs, medicaments and biological substances status: Secondary | ICD-10-CM | POA: Diagnosis not present

## 2017-11-16 DIAGNOSIS — R109 Unspecified abdominal pain: Secondary | ICD-10-CM | POA: Diagnosis not present

## 2017-11-16 DIAGNOSIS — Z87891 Personal history of nicotine dependence: Secondary | ICD-10-CM | POA: Diagnosis not present

## 2017-11-16 DIAGNOSIS — N200 Calculus of kidney: Secondary | ICD-10-CM | POA: Diagnosis not present

## 2017-11-16 DIAGNOSIS — N132 Hydronephrosis with renal and ureteral calculous obstruction: Secondary | ICD-10-CM | POA: Diagnosis not present

## 2017-11-17 DIAGNOSIS — Z87891 Personal history of nicotine dependence: Secondary | ICD-10-CM | POA: Diagnosis not present

## 2017-11-17 DIAGNOSIS — Z955 Presence of coronary angioplasty implant and graft: Secondary | ICD-10-CM | POA: Diagnosis not present

## 2017-11-17 DIAGNOSIS — Z888 Allergy status to other drugs, medicaments and biological substances status: Secondary | ICD-10-CM | POA: Diagnosis not present

## 2017-11-17 DIAGNOSIS — N2882 Megaloureter: Secondary | ICD-10-CM | POA: Diagnosis not present

## 2017-11-17 DIAGNOSIS — E785 Hyperlipidemia, unspecified: Secondary | ICD-10-CM | POA: Diagnosis not present

## 2017-11-17 DIAGNOSIS — N132 Hydronephrosis with renal and ureteral calculous obstruction: Secondary | ICD-10-CM | POA: Diagnosis not present

## 2017-11-17 DIAGNOSIS — R109 Unspecified abdominal pain: Secondary | ICD-10-CM | POA: Diagnosis not present

## 2017-11-17 DIAGNOSIS — I10 Essential (primary) hypertension: Secondary | ICD-10-CM | POA: Diagnosis not present

## 2017-11-17 DIAGNOSIS — I2510 Atherosclerotic heart disease of native coronary artery without angina pectoris: Secondary | ICD-10-CM | POA: Diagnosis not present

## 2017-11-17 DIAGNOSIS — N201 Calculus of ureter: Secondary | ICD-10-CM | POA: Diagnosis not present

## 2017-11-17 DIAGNOSIS — Z8551 Personal history of malignant neoplasm of bladder: Secondary | ICD-10-CM | POA: Diagnosis not present

## 2017-11-17 DIAGNOSIS — N200 Calculus of kidney: Secondary | ICD-10-CM | POA: Diagnosis not present

## 2017-11-19 DIAGNOSIS — N200 Calculus of kidney: Secondary | ICD-10-CM | POA: Diagnosis not present

## 2017-11-19 DIAGNOSIS — C672 Malignant neoplasm of lateral wall of bladder: Secondary | ICD-10-CM | POA: Diagnosis not present

## 2017-11-19 DIAGNOSIS — C779 Secondary and unspecified malignant neoplasm of lymph node, unspecified: Secondary | ICD-10-CM | POA: Diagnosis not present

## 2017-11-19 DIAGNOSIS — I10 Essential (primary) hypertension: Secondary | ICD-10-CM | POA: Diagnosis not present

## 2017-11-19 DIAGNOSIS — Z5112 Encounter for antineoplastic immunotherapy: Secondary | ICD-10-CM | POA: Diagnosis not present

## 2017-11-19 DIAGNOSIS — N2882 Megaloureter: Secondary | ICD-10-CM | POA: Diagnosis not present

## 2017-11-19 DIAGNOSIS — C61 Malignant neoplasm of prostate: Secondary | ICD-10-CM | POA: Diagnosis not present

## 2017-11-19 DIAGNOSIS — I2510 Atherosclerotic heart disease of native coronary artery without angina pectoris: Secondary | ICD-10-CM | POA: Diagnosis not present

## 2017-11-19 DIAGNOSIS — Z955 Presence of coronary angioplasty implant and graft: Secondary | ICD-10-CM | POA: Diagnosis not present

## 2017-11-26 DIAGNOSIS — Z466 Encounter for fitting and adjustment of urinary device: Secondary | ICD-10-CM | POA: Diagnosis not present

## 2017-12-16 DIAGNOSIS — C672 Malignant neoplasm of lateral wall of bladder: Secondary | ICD-10-CM | POA: Diagnosis not present

## 2017-12-16 DIAGNOSIS — Z960 Presence of urogenital implants: Secondary | ICD-10-CM | POA: Diagnosis not present

## 2017-12-16 DIAGNOSIS — I10 Essential (primary) hypertension: Secondary | ICD-10-CM | POA: Diagnosis not present

## 2017-12-16 DIAGNOSIS — C61 Malignant neoplasm of prostate: Secondary | ICD-10-CM | POA: Diagnosis not present

## 2017-12-16 DIAGNOSIS — Z906 Acquired absence of other parts of urinary tract: Secondary | ICD-10-CM | POA: Diagnosis not present

## 2017-12-16 DIAGNOSIS — Z87442 Personal history of urinary calculi: Secondary | ICD-10-CM | POA: Diagnosis not present

## 2017-12-16 DIAGNOSIS — Z955 Presence of coronary angioplasty implant and graft: Secondary | ICD-10-CM | POA: Diagnosis not present

## 2017-12-16 DIAGNOSIS — Z5112 Encounter for antineoplastic immunotherapy: Secondary | ICD-10-CM | POA: Diagnosis not present

## 2017-12-16 DIAGNOSIS — I2510 Atherosclerotic heart disease of native coronary artery without angina pectoris: Secondary | ICD-10-CM | POA: Diagnosis not present

## 2018-01-01 DIAGNOSIS — M799 Soft tissue disorder, unspecified: Secondary | ICD-10-CM | POA: Diagnosis not present

## 2018-01-01 DIAGNOSIS — C672 Malignant neoplasm of lateral wall of bladder: Secondary | ICD-10-CM | POA: Diagnosis not present

## 2018-01-01 DIAGNOSIS — N1330 Unspecified hydronephrosis: Secondary | ICD-10-CM | POA: Diagnosis not present

## 2018-01-01 DIAGNOSIS — J984 Other disorders of lung: Secondary | ICD-10-CM | POA: Diagnosis not present

## 2018-01-01 DIAGNOSIS — R911 Solitary pulmonary nodule: Secondary | ICD-10-CM | POA: Diagnosis not present

## 2018-01-01 DIAGNOSIS — N200 Calculus of kidney: Secondary | ICD-10-CM | POA: Diagnosis not present

## 2018-01-01 DIAGNOSIS — N132 Hydronephrosis with renal and ureteral calculous obstruction: Secondary | ICD-10-CM | POA: Diagnosis not present

## 2018-01-06 DIAGNOSIS — Z5112 Encounter for antineoplastic immunotherapy: Secondary | ICD-10-CM | POA: Diagnosis not present

## 2018-01-06 DIAGNOSIS — C775 Secondary and unspecified malignant neoplasm of intrapelvic lymph nodes: Secondary | ICD-10-CM | POA: Diagnosis not present

## 2018-01-06 DIAGNOSIS — N2882 Megaloureter: Secondary | ICD-10-CM | POA: Diagnosis not present

## 2018-01-06 DIAGNOSIS — Z955 Presence of coronary angioplasty implant and graft: Secondary | ICD-10-CM | POA: Diagnosis not present

## 2018-01-06 DIAGNOSIS — I2510 Atherosclerotic heart disease of native coronary artery without angina pectoris: Secondary | ICD-10-CM | POA: Diagnosis not present

## 2018-01-06 DIAGNOSIS — D098 Carcinoma in situ of other specified sites: Secondary | ICD-10-CM | POA: Diagnosis not present

## 2018-01-06 DIAGNOSIS — C61 Malignant neoplasm of prostate: Secondary | ICD-10-CM | POA: Diagnosis not present

## 2018-01-06 DIAGNOSIS — C672 Malignant neoplasm of lateral wall of bladder: Secondary | ICD-10-CM | POA: Diagnosis not present

## 2018-01-06 DIAGNOSIS — I10 Essential (primary) hypertension: Secondary | ICD-10-CM | POA: Diagnosis not present

## 2018-01-19 DIAGNOSIS — N281 Cyst of kidney, acquired: Secondary | ICD-10-CM | POA: Diagnosis not present

## 2018-01-19 DIAGNOSIS — N200 Calculus of kidney: Secondary | ICD-10-CM | POA: Diagnosis not present

## 2018-01-27 DIAGNOSIS — N2882 Megaloureter: Secondary | ICD-10-CM | POA: Diagnosis not present

## 2018-01-27 DIAGNOSIS — I10 Essential (primary) hypertension: Secondary | ICD-10-CM | POA: Diagnosis not present

## 2018-01-27 DIAGNOSIS — C61 Malignant neoplasm of prostate: Secondary | ICD-10-CM | POA: Diagnosis not present

## 2018-01-27 DIAGNOSIS — C672 Malignant neoplasm of lateral wall of bladder: Secondary | ICD-10-CM | POA: Diagnosis not present

## 2018-01-27 DIAGNOSIS — D72829 Elevated white blood cell count, unspecified: Secondary | ICD-10-CM | POA: Diagnosis not present

## 2018-01-27 DIAGNOSIS — I2510 Atherosclerotic heart disease of native coronary artery without angina pectoris: Secondary | ICD-10-CM | POA: Diagnosis not present

## 2018-01-27 DIAGNOSIS — N200 Calculus of kidney: Secondary | ICD-10-CM | POA: Diagnosis not present

## 2018-01-27 DIAGNOSIS — Z960 Presence of urogenital implants: Secondary | ICD-10-CM | POA: Diagnosis not present

## 2018-01-27 DIAGNOSIS — Z5112 Encounter for antineoplastic immunotherapy: Secondary | ICD-10-CM | POA: Diagnosis not present

## 2018-02-05 ENCOUNTER — Encounter: Payer: Self-pay | Admitting: Family Medicine

## 2018-02-05 ENCOUNTER — Ambulatory Visit (INDEPENDENT_AMBULATORY_CARE_PROVIDER_SITE_OTHER): Payer: Medicare HMO | Admitting: Family Medicine

## 2018-02-05 VITALS — BP 126/78 | HR 103 | Temp 98.2°F | Wt 155.2 lb

## 2018-02-05 DIAGNOSIS — J069 Acute upper respiratory infection, unspecified: Secondary | ICD-10-CM

## 2018-02-05 DIAGNOSIS — C672 Malignant neoplasm of lateral wall of bladder: Secondary | ICD-10-CM | POA: Diagnosis not present

## 2018-02-05 MED ORDER — AMOXICILLIN 875 MG PO TABS: 875.0000 mg | ORAL_TABLET | Freq: Two times a day (BID) | ORAL | 0 refills | Status: DC

## 2018-02-05 NOTE — Progress Notes (Signed)
Patient: Terry [name] [facility] Male    DOB: [birth year]   82 y.o.   MRN: [id number] Visit Date: 02/05/2018  Today's Provider: [name], PA   Chief Complaint  Patient presents with  . Cough   Subjective:    URI   This is a new problem. Episode onset: 1 week ago. The problem has been unchanged. There has been no fever. Associated symptoms include congestion and coughing. Treatments tried: NyQuil. The treatment provided no relief.   Past Medical History:  Diagnosis Date  . Hyperlipidemia   . Hypertension    Past Surgical History:  Procedure Laterality Date  . BLADDER SURGERY  06/2015  . deviated nose septum surgery  1970  . EP IMPLANTABLE DEVICE    . HERNIA REPAIR  1980  . single septum port    . stomach ulcer surgery  1994  . vascular stent  2006   Family History  Problem Relation Age of Onset  . Diabetes Mother   . Heart disease Mother   . Hypertension Father   . Alzheimer's disease Father   . Hip fracture Father   . Prostate cancer Paternal Uncle    Allergies  Allergen Reactions  . Triprolidine-Pseudoephedrine Rash and Hives  . Sudafed Pe Cold-Cough  [Phenylephrine-Dm-Gg-Apap] Hives  . Actifed Cold-Allergy  [Chlorpheniramine-Phenylephrine] Rash    Current Outpatient Medications:  .  acetaminophen (TYLENOL) 325 MG tablet, Take 650 mg by mouth every 6 (six) hours as needed., Disp: , Rfl:  .  aspirin 81 MG tablet, Take 1 tablet by mouth daily., Disp: , Rfl:  .  cetirizine (ZYRTEC) 10 MG tablet, Take 10 mg by mouth daily., Disp: , Rfl:  .  diphenhydramine-acetaminophen (TYLENOL PM) 25-500 MG TABS tablet, Take 1 tablet by mouth at bedtime as needed., Disp: , Rfl:  .  docusate sodium (COLACE) 100 MG capsule, Take 100 mg by mouth as needed. , Disp: , Rfl:  .  famotidine (PEPCID) 20 MG tablet, Take 20 mg by mouth daily., Disp: , Rfl:  .  Multiple Vitamin (MULTI-VITAMINS) TABS, Take by mouth., Disp: , Rfl:  .  neomycin-bacitracin-polymyxin (NEOSPORIN)  [phone] ointment, Apply 1 application topically as needed., Disp: , Rfl:  .  pravastatin (PRAVACHOL) 20 MG tablet, TAKE 1 TABLET EVERY DAY, Disp: 90 tablet, Rfl: 3 .  senna (SENOKOT) 8.6 MG tablet, Take by mouth as needed. , Disp: , Rfl:  .  triamcinolone cream (KENALOG) 0.1 %, Apply 1 application topically 2 (two) times daily. , Disp: , Rfl:   Review of Systems  Constitutional: Negative.   HENT: Positive for congestion.   Respiratory: Positive for cough.   Cardiovascular: Negative.    Social History   Tobacco Use  . Smoking status: Former Smoker    Packs/day: 1.50    Years: 50.00    Pack years: 75.00    Types: Cigarettes    Last attempt to quit: 06/18/2005    Years since quitting: 12.6  . Smokeless tobacco: Never Used  Substance Use Topics  . Alcohol use: No    Alcohol/week: 0.0 oz   Objective:   BP 126/78 (BP Location: Right Arm, Patient Position: Sitting, Cuff Size: Normal)   Pulse (!) 103   Temp 98.2 F (36.8 C) (Oral)   Wt 155 lb 3.2 oz (70.4 kg)   SpO2 96%   BMI 24.31 kg/m   Physical Exam  Constitutional: He is oriented to person, place, and time. He appears well-developed and well-nourished. No distress.  HENT:  Head: Normocephalic and atraumatic.  Right Ear: Hearing and external ear normal.  Left Ear: Hearing and external ear normal.  Nose: Nose normal.  Eyes: Conjunctivae and lids are normal. Right eye exhibits no discharge. Left eye exhibits no discharge. No scleral icterus.  Neck: Neck supple.  Cardiovascular: Normal rate and regular rhythm.  Pulmonary/Chest: Effort normal and breath sounds normal. No respiratory distress. He has no wheezes. He has no rales.  Abdominal: Soft. Bowel sounds are normal.  Ileal conduit in the RLQ for bladder cancer.  Musculoskeletal: Normal range of motion.  Neurological: He is alert and oriented to person, place, and time.  Skin: Skin is intact. No lesion and no rash noted.  Psychiatric: He has a normal mood and affect.  His speech is normal and behavior is normal. Thought content normal.      Assessment & Plan:     1. URI with cough and congestion Onset a weeks ago. No fever or chills. Slight scratchy throat but no wheezes, rales or rhonchi on exam. May use Mucinex-DM and will make Amoxil available to start if any fever or purulent sputum production with his history of cancer in bladder. Increase fluid intake and recheck prn. - amoxicillin (AMOXIL) 875 MG tablet; Take 1 tablet (875 mg total) by mouth 2 (two) times daily.  Dispense: 20 tablet; Refill: 0  2. Cancer of lateral wall of urinary bladder ([place]) Diagnosed with bladder canecr in 2016 and had to have cystectomy which incidentally found prostate cancer. Has a ileal conduit revision 10-03-15. Continues to have treatments at the [facility] clinic regularly.      [name], PA  [facility]

## 2018-02-17 DIAGNOSIS — Z936 Other artificial openings of urinary tract status: Secondary | ICD-10-CM | POA: Diagnosis not present

## 2018-02-17 DIAGNOSIS — R21 Rash and other nonspecific skin eruption: Secondary | ICD-10-CM | POA: Diagnosis not present

## 2018-02-17 DIAGNOSIS — I10 Essential (primary) hypertension: Secondary | ICD-10-CM | POA: Diagnosis not present

## 2018-02-17 DIAGNOSIS — C672 Malignant neoplasm of lateral wall of bladder: Secondary | ICD-10-CM | POA: Diagnosis not present

## 2018-02-17 DIAGNOSIS — Z906 Acquired absence of other parts of urinary tract: Secondary | ICD-10-CM | POA: Diagnosis not present

## 2018-02-17 DIAGNOSIS — Z5112 Encounter for antineoplastic immunotherapy: Secondary | ICD-10-CM | POA: Diagnosis not present

## 2018-02-17 DIAGNOSIS — C61 Malignant neoplasm of prostate: Secondary | ICD-10-CM | POA: Diagnosis not present

## 2018-02-17 DIAGNOSIS — I2510 Atherosclerotic heart disease of native coronary artery without angina pectoris: Secondary | ICD-10-CM | POA: Diagnosis not present

## 2018-02-17 DIAGNOSIS — Z923 Personal history of irradiation: Secondary | ICD-10-CM | POA: Diagnosis not present

## 2018-02-17 DIAGNOSIS — Z87891 Personal history of nicotine dependence: Secondary | ICD-10-CM | POA: Diagnosis not present

## 2018-03-11 DIAGNOSIS — Z886 Allergy status to analgesic agent status: Secondary | ICD-10-CM | POA: Diagnosis not present

## 2018-03-11 DIAGNOSIS — Z8546 Personal history of malignant neoplasm of prostate: Secondary | ICD-10-CM | POA: Diagnosis not present

## 2018-03-11 DIAGNOSIS — Z5112 Encounter for antineoplastic immunotherapy: Secondary | ICD-10-CM | POA: Diagnosis not present

## 2018-03-11 DIAGNOSIS — I2510 Atherosclerotic heart disease of native coronary artery without angina pectoris: Secondary | ICD-10-CM | POA: Diagnosis not present

## 2018-03-11 DIAGNOSIS — Z87891 Personal history of nicotine dependence: Secondary | ICD-10-CM | POA: Diagnosis not present

## 2018-03-11 DIAGNOSIS — Z955 Presence of coronary angioplasty implant and graft: Secondary | ICD-10-CM | POA: Diagnosis not present

## 2018-03-11 DIAGNOSIS — I10 Essential (primary) hypertension: Secondary | ICD-10-CM | POA: Diagnosis not present

## 2018-03-11 DIAGNOSIS — C672 Malignant neoplasm of lateral wall of bladder: Secondary | ICD-10-CM | POA: Diagnosis not present

## 2018-03-11 DIAGNOSIS — Z923 Personal history of irradiation: Secondary | ICD-10-CM | POA: Diagnosis not present

## 2018-03-30 DIAGNOSIS — R590 Localized enlarged lymph nodes: Secondary | ICD-10-CM | POA: Diagnosis not present

## 2018-03-30 DIAGNOSIS — C672 Malignant neoplasm of lateral wall of bladder: Secondary | ICD-10-CM | POA: Diagnosis not present

## 2018-03-30 DIAGNOSIS — R1900 Intra-abdominal and pelvic swelling, mass and lump, unspecified site: Secondary | ICD-10-CM | POA: Diagnosis not present

## 2018-03-30 DIAGNOSIS — R911 Solitary pulmonary nodule: Secondary | ICD-10-CM | POA: Diagnosis not present

## 2018-04-01 DIAGNOSIS — Z955 Presence of coronary angioplasty implant and graft: Secondary | ICD-10-CM | POA: Diagnosis not present

## 2018-04-01 DIAGNOSIS — C672 Malignant neoplasm of lateral wall of bladder: Secondary | ICD-10-CM | POA: Diagnosis not present

## 2018-04-01 DIAGNOSIS — C775 Secondary and unspecified malignant neoplasm of intrapelvic lymph nodes: Secondary | ICD-10-CM | POA: Diagnosis not present

## 2018-04-01 DIAGNOSIS — N200 Calculus of kidney: Secondary | ICD-10-CM | POA: Diagnosis not present

## 2018-04-01 DIAGNOSIS — I2510 Atherosclerotic heart disease of native coronary artery without angina pectoris: Secondary | ICD-10-CM | POA: Diagnosis not present

## 2018-04-01 DIAGNOSIS — R1900 Intra-abdominal and pelvic swelling, mass and lump, unspecified site: Secondary | ICD-10-CM | POA: Diagnosis not present

## 2018-04-01 DIAGNOSIS — N2882 Megaloureter: Secondary | ICD-10-CM | POA: Diagnosis not present

## 2018-04-01 DIAGNOSIS — I10 Essential (primary) hypertension: Secondary | ICD-10-CM | POA: Diagnosis not present

## 2018-04-01 DIAGNOSIS — Z5112 Encounter for antineoplastic immunotherapy: Secondary | ICD-10-CM | POA: Diagnosis not present

## 2018-04-12 ENCOUNTER — Telehealth: Payer: Self-pay | Admitting: Family Medicine

## 2018-04-12 NOTE — Telephone Encounter (Signed)
Left message for patient to return call for an appt.

## 2018-04-16 ENCOUNTER — Ambulatory Visit (INDEPENDENT_AMBULATORY_CARE_PROVIDER_SITE_OTHER): Payer: Medicare HMO | Admitting: Family Medicine

## 2018-04-16 ENCOUNTER — Encounter: Payer: Self-pay | Admitting: Family Medicine

## 2018-04-16 VITALS — BP 128/76 | HR 73 | Temp 97.8°F | Wt 151.2 lb

## 2018-04-16 DIAGNOSIS — Z936 Other artificial openings of urinary tract status: Secondary | ICD-10-CM

## 2018-04-16 DIAGNOSIS — C672 Malignant neoplasm of lateral wall of bladder: Secondary | ICD-10-CM | POA: Diagnosis not present

## 2018-04-16 DIAGNOSIS — R197 Diarrhea, unspecified: Secondary | ICD-10-CM

## 2018-04-16 NOTE — Progress Notes (Signed)
Patient: Terry [name] [facility] Male    DOB: August 12, 1936   82 y.o.   MRN: [id number] Visit Date: 04/16/2018  Today's Provider: [name], PA   Chief Complaint  Patient presents with  . Diarrhea   Subjective:    Diarrhea   This is a new problem. Episode onset: 2-3 weeks ago. Episode frequency: episodes  The problem has been gradually improving. The stool consistency is described as watery. The patient states that diarrhea does not awaken him from sleep. Pertinent negatives include no abdominal pain or vomiting. Nothing aggravates the symptoms. Treatments tried: Immodium. The treatment provided moderate relief.      Past Medical History:  Diagnosis Date  . Hyperlipidemia   . Hypertension    Past Surgical History:  Procedure Laterality Date  . BLADDER SURGERY  06/2015  . deviated nose septum surgery  1970  . EP IMPLANTABLE DEVICE    . HERNIA REPAIR  1980  . single septum port    . stomach ulcer surgery  1994  . vascular stent  2006   Family History  Problem Relation Age of Onset  . Diabetes Mother   . Heart disease Mother   . Hypertension Father   . Alzheimer's disease Father   . Hip fracture Father   . Prostate cancer Paternal Uncle    Allergies  Allergen Reactions  . Triprolidine-Pseudoephedrine Rash and Hives  . Sudafed Pe Cold-Cough  [Phenylephrine-Dm-Gg-Apap] Hives  . Actifed Cold-Allergy  [Chlorpheniramine-Phenylephrine] Rash    Current Outpatient Medications:  .  acetaminophen (TYLENOL) 325 MG tablet, Take 650 mg by mouth every 6 (six) hours as needed., Disp: , Rfl:  .  aspirin 81 MG tablet, Take 1 tablet by mouth daily., Disp: , Rfl:  .  cetirizine (ZYRTEC) 10 MG tablet, Take 10 mg by mouth daily., Disp: , Rfl:  .  diphenhydramine-acetaminophen (TYLENOL PM) 25-500 MG TABS tablet, Take 1 tablet by mouth at bedtime as needed., Disp: , Rfl:  .  famotidine (PEPCID) 20 MG tablet, Take 20 mg by mouth daily., Disp: , Rfl:  .  Multiple Vitamin (MULTI-VITAMINS)  TABS, Take by mouth., Disp: , Rfl:  .  neomycin-bacitracin-polymyxin (NEOSPORIN) [phone] ointment, Apply 1 application topically as needed., Disp: , Rfl:  .  pravastatin (PRAVACHOL) 20 MG tablet, TAKE 1 TABLET EVERY DAY, Disp: 90 tablet, Rfl: 3 .  senna (SENOKOT) 8.6 MG tablet, Take by mouth as needed. , Disp: , Rfl:  .  triamcinolone cream (KENALOG) 0.1 %, Apply 1 application topically 2 (two) times daily. , Disp: , Rfl:  .  docusate sodium (COLACE) 100 MG capsule, Take 100 mg by mouth as needed. , Disp: , Rfl:   Review of Systems  Constitutional: Negative.   Respiratory: Negative.   Cardiovascular: Negative.   Gastrointestinal: Positive for diarrhea. Negative for abdominal pain and vomiting.   Social History   Tobacco Use  . Smoking status: Former Smoker    Packs/day: 1.50    Years: 50.00    Pack years: 75.00    Types: Cigarettes    Last attempt to quit: 06/18/2005    Years since quitting: 12.8  . Smokeless tobacco: Never Used  Substance Use Topics  . Alcohol use: No    Alcohol/week: 0.0 oz   Objective:   BP 128/76 (BP Location: Right Arm, Patient Position: Sitting, Cuff Size: Normal)   Pulse 73   Temp 97.8 F (36.6 C) (Oral)   Wt 151 lb 3.2 oz (68.6 kg)   SpO2 96%  BMI 23.68 kg/m  Wt Readings from Last 3 Encounters:  04/16/18 151 lb 3.2 oz (68.6 kg)  02/05/18 155 lb 3.2 oz (70.4 kg)  07/23/17 151 lb (68.5 kg)   Physical Exam  Constitutional: He is oriented to person, place, and time. He appears well-developed and well-nourished. No distress.  HENT:  Head: Normocephalic and atraumatic.  Right Ear: Hearing normal.  Left Ear: Hearing normal.  Nose: Nose normal.  Eyes: Conjunctivae and lids are normal. Right eye exhibits no discharge. Left eye exhibits no discharge. No scleral icterus.  Cardiovascular: Normal rate.  Pulmonary/Chest: Effort normal and breath sounds normal. No respiratory distress.  Abdominal: Soft. Bowel sounds are normal. He exhibits no distension  and no mass. There is no tenderness. There is no rebound and no guarding.  RLQ ileocystostomy secondary to bladder cancer.  Musculoskeletal: Normal range of motion.  Neurological: He is alert and oriented to person, place, and time.  Skin: Skin is intact. No lesion and no rash noted.  Psychiatric: He has a normal mood and affect. His speech is normal and behavior is normal. Thought content normal.      Assessment & Plan:     1. Diarrhea, unspecified type Intermittent watery stools once a day over the past 2-3 weeks. Does not occur every day. Denies nausea, vomiting or abdominal pains/cramps. No blood in stools and has not had any significant change in bladder cancer treatment. Ostomy still draining well into a bag. Recommend Probiotic Supplement daily and check labs for infection or dehydration. - CBC with Differential/Platelet - Comprehensive metabolic panel  2. Cancer of lateral wall of urinary bladder ([facility]) Diagnosed with bladder cancer in 2016 and had to have cystectomy which incidentally found prostate cancer. Has a ileal conduit revision 10-03-15. Continues to have treatments at the [facility] clinic regularly.  3. S/P ileal conduit ([facility]) RLQ ileocystostomy with bag for urine collection. No sign of infection. Ostomy is pink and healthy tissue.     [name], PA  [facility]

## 2018-04-17 LAB — COMPREHENSIVE METABOLIC PANEL
ALBUMIN: 4.3 g/dL (ref 3.5–4.8)
ALT: 18 IU/L (ref 0–44)
AST: 20 IU/L (ref 0–40)
Albumin/Globulin Ratio: 1.7 (ref 1.2–2.2)
Alkaline Phosphatase: 90 IU/L (ref 39–117)
BUN / CREAT RATIO: 12 (ref 10–24)
BUN: 16 mg/dL (ref 8–27)
Bilirubin Total: 0.5 mg/dL (ref 0.0–1.2)
CALCIUM: 9.7 mg/dL (ref 8.6–10.2)
CO2: 22 mmol/L (ref 20–29)
Chloride: 109 mmol/L — ABNORMAL HIGH (ref 96–106)
Creatinine, Ser: 1.39 mg/dL — ABNORMAL HIGH (ref 0.76–1.27)
GFR calc Af Amer: 55 mL/min/{1.73_m2} — ABNORMAL LOW (ref >59)
GFR, EST NON AFRICAN AMERICAN: 48 mL/min/{1.73_m2} — AB (ref >59)
GLOBULIN, TOTAL: 2.5 g/dL (ref 1.5–4.5)
Glucose: 90 mg/dL (ref 65–99)
Potassium: 4.7 mmol/L (ref 3.5–5.2)
SODIUM: 144 mmol/L (ref 134–144)
Total Protein: 6.8 g/dL (ref 6.0–8.5)

## 2018-04-17 LAB — CBC WITH DIFFERENTIAL/PLATELET
BASOS: 1 %
Basophils Absolute: 0.1 10*3/uL (ref 0.0–0.2)
EOS (ABSOLUTE): 0.5 10*3/uL — ABNORMAL HIGH (ref 0.0–0.4)
EOS: 7 %
HEMATOCRIT: 40.3 % (ref 37.5–51.0)
HEMOGLOBIN: 13 g/dL (ref 13.0–17.7)
Immature Grans (Abs): 0 10*3/uL (ref 0.0–0.1)
Immature Granulocytes: 1 %
LYMPHS ABS: 1.1 10*3/uL (ref 0.7–3.1)
Lymphs: 16 %
MCH: 28 pg (ref 26.6–33.0)
MCHC: 32.3 g/dL (ref 31.5–35.7)
MCV: 87 fL (ref 79–97)
MONOCYTES: 10 %
Monocytes Absolute: 0.7 10*3/uL (ref 0.1–0.9)
NEUTROS ABS: 4.8 10*3/uL (ref 1.4–7.0)
Neutrophils: 65 %
Platelets: 263 10*3/uL (ref 150–379)
RBC: 4.64 x10E6/uL (ref 4.14–5.80)
RDW: 15.2 % (ref 12.3–15.4)
WBC: 7.3 10*3/uL (ref 3.4–10.8)

## 2018-04-22 DIAGNOSIS — I10 Essential (primary) hypertension: Secondary | ICD-10-CM | POA: Diagnosis not present

## 2018-04-22 DIAGNOSIS — N200 Calculus of kidney: Secondary | ICD-10-CM | POA: Diagnosis not present

## 2018-04-22 DIAGNOSIS — N2882 Megaloureter: Secondary | ICD-10-CM | POA: Diagnosis not present

## 2018-04-22 DIAGNOSIS — R1900 Intra-abdominal and pelvic swelling, mass and lump, unspecified site: Secondary | ICD-10-CM | POA: Diagnosis not present

## 2018-04-22 DIAGNOSIS — Z5112 Encounter for antineoplastic immunotherapy: Secondary | ICD-10-CM | POA: Diagnosis not present

## 2018-04-22 DIAGNOSIS — Z960 Presence of urogenital implants: Secondary | ICD-10-CM | POA: Diagnosis not present

## 2018-04-22 DIAGNOSIS — I2510 Atherosclerotic heart disease of native coronary artery without angina pectoris: Secondary | ICD-10-CM | POA: Diagnosis not present

## 2018-04-22 DIAGNOSIS — C61 Malignant neoplasm of prostate: Secondary | ICD-10-CM | POA: Diagnosis not present

## 2018-04-22 DIAGNOSIS — C672 Malignant neoplasm of lateral wall of bladder: Secondary | ICD-10-CM | POA: Diagnosis not present

## 2018-05-17 DIAGNOSIS — C672 Malignant neoplasm of lateral wall of bladder: Secondary | ICD-10-CM | POA: Diagnosis not present

## 2018-05-17 DIAGNOSIS — R1900 Intra-abdominal and pelvic swelling, mass and lump, unspecified site: Secondary | ICD-10-CM | POA: Diagnosis not present

## 2018-05-17 DIAGNOSIS — Z5112 Encounter for antineoplastic immunotherapy: Secondary | ICD-10-CM | POA: Diagnosis not present

## 2018-05-17 DIAGNOSIS — Z8546 Personal history of malignant neoplasm of prostate: Secondary | ICD-10-CM | POA: Diagnosis not present

## 2018-05-17 DIAGNOSIS — Z923 Personal history of irradiation: Secondary | ICD-10-CM | POA: Diagnosis not present

## 2018-05-17 DIAGNOSIS — I2510 Atherosclerotic heart disease of native coronary artery without angina pectoris: Secondary | ICD-10-CM | POA: Diagnosis not present

## 2018-05-17 DIAGNOSIS — K589 Irritable bowel syndrome without diarrhea: Secondary | ICD-10-CM | POA: Diagnosis not present

## 2018-05-17 DIAGNOSIS — I10 Essential (primary) hypertension: Secondary | ICD-10-CM | POA: Diagnosis not present

## 2018-05-17 DIAGNOSIS — Z906 Acquired absence of other parts of urinary tract: Secondary | ICD-10-CM | POA: Diagnosis not present

## 2018-06-07 DIAGNOSIS — C672 Malignant neoplasm of lateral wall of bladder: Secondary | ICD-10-CM | POA: Diagnosis not present

## 2018-06-07 DIAGNOSIS — N132 Hydronephrosis with renal and ureteral calculous obstruction: Secondary | ICD-10-CM | POA: Diagnosis not present

## 2018-06-07 DIAGNOSIS — K589 Irritable bowel syndrome without diarrhea: Secondary | ICD-10-CM | POA: Diagnosis not present

## 2018-06-07 DIAGNOSIS — C61 Malignant neoplasm of prostate: Secondary | ICD-10-CM | POA: Diagnosis not present

## 2018-06-07 DIAGNOSIS — N2882 Megaloureter: Secondary | ICD-10-CM | POA: Diagnosis not present

## 2018-06-07 DIAGNOSIS — C679 Malignant neoplasm of bladder, unspecified: Secondary | ICD-10-CM | POA: Diagnosis not present

## 2018-06-07 DIAGNOSIS — I10 Essential (primary) hypertension: Secondary | ICD-10-CM | POA: Diagnosis not present

## 2018-06-07 DIAGNOSIS — K913 Postprocedural intestinal obstruction, unspecified as to partial versus complete: Secondary | ICD-10-CM | POA: Diagnosis not present

## 2018-06-07 DIAGNOSIS — R1900 Intra-abdominal and pelvic swelling, mass and lump, unspecified site: Secondary | ICD-10-CM | POA: Diagnosis not present

## 2018-06-07 DIAGNOSIS — C799 Secondary malignant neoplasm of unspecified site: Secondary | ICD-10-CM | POA: Diagnosis not present

## 2018-06-25 DIAGNOSIS — M799 Soft tissue disorder, unspecified: Secondary | ICD-10-CM | POA: Diagnosis not present

## 2018-06-25 DIAGNOSIS — R911 Solitary pulmonary nodule: Secondary | ICD-10-CM | POA: Diagnosis not present

## 2018-06-25 DIAGNOSIS — N1330 Unspecified hydronephrosis: Secondary | ICD-10-CM | POA: Diagnosis not present

## 2018-06-25 DIAGNOSIS — K435 Parastomal hernia without obstruction or  gangrene: Secondary | ICD-10-CM | POA: Diagnosis not present

## 2018-06-25 DIAGNOSIS — N132 Hydronephrosis with renal and ureteral calculous obstruction: Secondary | ICD-10-CM | POA: Diagnosis not present

## 2018-06-25 DIAGNOSIS — C672 Malignant neoplasm of lateral wall of bladder: Secondary | ICD-10-CM | POA: Diagnosis not present

## 2018-06-28 DIAGNOSIS — K589 Irritable bowel syndrome without diarrhea: Secondary | ICD-10-CM | POA: Diagnosis not present

## 2018-06-28 DIAGNOSIS — C61 Malignant neoplasm of prostate: Secondary | ICD-10-CM | POA: Diagnosis not present

## 2018-06-28 DIAGNOSIS — Z87891 Personal history of nicotine dependence: Secondary | ICD-10-CM | POA: Diagnosis not present

## 2018-06-28 DIAGNOSIS — C672 Malignant neoplasm of lateral wall of bladder: Secondary | ICD-10-CM | POA: Diagnosis not present

## 2018-06-28 DIAGNOSIS — Z923 Personal history of irradiation: Secondary | ICD-10-CM | POA: Diagnosis not present

## 2018-06-28 DIAGNOSIS — Z8042 Family history of malignant neoplasm of prostate: Secondary | ICD-10-CM | POA: Diagnosis not present

## 2018-06-28 DIAGNOSIS — Z5111 Encounter for antineoplastic chemotherapy: Secondary | ICD-10-CM | POA: Diagnosis not present

## 2018-06-28 DIAGNOSIS — I2510 Atherosclerotic heart disease of native coronary artery without angina pectoris: Secondary | ICD-10-CM | POA: Diagnosis not present

## 2018-06-28 DIAGNOSIS — Z79899 Other long term (current) drug therapy: Secondary | ICD-10-CM | POA: Diagnosis not present

## 2018-07-02 ENCOUNTER — Telehealth: Payer: Self-pay

## 2018-07-02 NOTE — Telephone Encounter (Signed)
LM for pt to CB and schedule AWV after 07/24/18.

## 2018-07-05 ENCOUNTER — Telehealth: Payer: Self-pay | Admitting: Family Medicine

## 2018-07-05 NOTE — Telephone Encounter (Signed)
Pt returned called and is schedule for AWV with [facility] @ 10 & CPE/Follow-up with Simona Huh 10:40 am on Friday 08/20/18. Pt wanted both appts on the same day. Thanks TNP

## 2018-07-05 NOTE — Telephone Encounter (Signed)
Pt stated that his LOV on 04/16/18 he was advised to start taking OTC probiotic. Pt is asking if Simona Huh wants pt to continue taking the medication. Pt was advised that Simona Huh is out of the office the next few weeks. Please advise. Thanks TNP

## 2018-07-06 NOTE — Telephone Encounter (Signed)
If diarrhea is better then he can stay on it.

## 2018-07-06 NOTE — Telephone Encounter (Signed)
Patient advised. He states the diarrhea is better than it was, but he is still having episodes.

## 2018-07-07 NOTE — Telephone Encounter (Signed)
Patient advised to continue Probiotics.

## 2018-07-07 NOTE — Telephone Encounter (Signed)
Then I would suggest he stay on the probiotics for the time being.

## 2018-07-22 DIAGNOSIS — E0789 Other specified disorders of thyroid: Secondary | ICD-10-CM | POA: Diagnosis not present

## 2018-07-22 DIAGNOSIS — I10 Essential (primary) hypertension: Secondary | ICD-10-CM | POA: Diagnosis not present

## 2018-07-22 DIAGNOSIS — C672 Malignant neoplasm of lateral wall of bladder: Secondary | ICD-10-CM | POA: Diagnosis not present

## 2018-07-22 DIAGNOSIS — Z936 Other artificial openings of urinary tract status: Secondary | ICD-10-CM | POA: Diagnosis not present

## 2018-07-22 DIAGNOSIS — R319 Hematuria, unspecified: Secondary | ICD-10-CM | POA: Diagnosis not present

## 2018-07-22 DIAGNOSIS — Z9889 Other specified postprocedural states: Secondary | ICD-10-CM | POA: Diagnosis not present

## 2018-07-22 DIAGNOSIS — Z5112 Encounter for antineoplastic immunotherapy: Secondary | ICD-10-CM | POA: Diagnosis not present

## 2018-07-22 DIAGNOSIS — R21 Rash and other nonspecific skin eruption: Secondary | ICD-10-CM | POA: Diagnosis not present

## 2018-07-22 DIAGNOSIS — N132 Hydronephrosis with renal and ureteral calculous obstruction: Secondary | ICD-10-CM | POA: Diagnosis not present

## 2018-08-12 DIAGNOSIS — C61 Malignant neoplasm of prostate: Secondary | ICD-10-CM | POA: Diagnosis not present

## 2018-08-12 DIAGNOSIS — K458 Other specified abdominal hernia without obstruction or gangrene: Secondary | ICD-10-CM | POA: Diagnosis not present

## 2018-08-12 DIAGNOSIS — C672 Malignant neoplasm of lateral wall of bladder: Secondary | ICD-10-CM | POA: Diagnosis not present

## 2018-08-12 DIAGNOSIS — D090 Carcinoma in situ of bladder: Secondary | ICD-10-CM | POA: Diagnosis not present

## 2018-08-12 DIAGNOSIS — I10 Essential (primary) hypertension: Secondary | ICD-10-CM | POA: Diagnosis not present

## 2018-08-12 DIAGNOSIS — Z5112 Encounter for antineoplastic immunotherapy: Secondary | ICD-10-CM | POA: Diagnosis not present

## 2018-08-12 DIAGNOSIS — C775 Secondary and unspecified malignant neoplasm of intrapelvic lymph nodes: Secondary | ICD-10-CM | POA: Diagnosis not present

## 2018-08-12 DIAGNOSIS — R1900 Intra-abdominal and pelvic swelling, mass and lump, unspecified site: Secondary | ICD-10-CM | POA: Diagnosis not present

## 2018-08-12 DIAGNOSIS — N1330 Unspecified hydronephrosis: Secondary | ICD-10-CM | POA: Diagnosis not present

## 2018-08-20 ENCOUNTER — Encounter: Payer: Self-pay | Admitting: Family Medicine

## 2018-08-20 ENCOUNTER — Ambulatory Visit (INDEPENDENT_AMBULATORY_CARE_PROVIDER_SITE_OTHER): Payer: Medicare HMO | Admitting: Family Medicine

## 2018-08-20 ENCOUNTER — Ambulatory Visit (INDEPENDENT_AMBULATORY_CARE_PROVIDER_SITE_OTHER): Payer: Medicare HMO

## 2018-08-20 VITALS — BP 136/72 | HR 84 | Temp 97.8°F | Ht 67.0 in | Wt 146.0 lb

## 2018-08-20 DIAGNOSIS — Z936 Other artificial openings of urinary tract status: Secondary | ICD-10-CM | POA: Diagnosis not present

## 2018-08-20 DIAGNOSIS — E785 Hyperlipidemia, unspecified: Secondary | ICD-10-CM | POA: Diagnosis not present

## 2018-08-20 DIAGNOSIS — I10 Essential (primary) hypertension: Secondary | ICD-10-CM | POA: Diagnosis not present

## 2018-08-20 DIAGNOSIS — Z0000 Encounter for general adult medical examination without abnormal findings: Secondary | ICD-10-CM

## 2018-08-20 DIAGNOSIS — E7800 Pure hypercholesterolemia, unspecified: Secondary | ICD-10-CM | POA: Diagnosis not present

## 2018-08-20 DIAGNOSIS — C672 Malignant neoplasm of lateral wall of bladder: Secondary | ICD-10-CM

## 2018-08-20 MED ORDER — PRAVASTATIN SODIUM 20 MG PO TABS: 20.0000 mg | ORAL_TABLET | Freq: Every day | ORAL | 3 refills | Status: DC

## 2018-08-20 NOTE — Patient Instructions (Addendum)
Mr. Terry [name] , Thank you for taking time to come for your Medicare Wellness Visit. I appreciate your ongoing commitment to your health goals. Please review the following plan we discussed and let me know if I can assist you in the future.   Screening recommendations/referrals: Colonoscopy: Up to date Recommended yearly ophthalmology/optometry visit for glaucoma screening and checkup Recommended yearly dental visit for hygiene and checkup  Vaccinations: Influenza vaccine: Pt declines today.  Pneumococcal vaccine: Up to date Tdap vaccine: Up to date Shingles vaccine: Pt declines today.     Advanced directives: Please bring a copy of your POA (Power of Attorney) and/or Living Will to your next appointment.   Conditions/risks identified: Recommend to eat 1-2 servings of fruit a day.   Next appointment: 10:40 AM today with Terry [name].   Preventive Care 80 Years and Older, Male Preventive care refers to lifestyle choices and visits with your health care provider that can promote health and wellness. What does preventive care include?  A yearly physical exam. This is also called an annual well check.  Dental exams once or twice a year.  Routine eye exams. Ask your health care provider how often you should have your eyes checked.  Personal lifestyle choices, including:  Daily care of your teeth and gums.  Regular physical activity.  Eating a healthy diet.  Avoiding tobacco and drug use.  Limiting alcohol use.  Practicing safe sex.  Taking low doses of aspirin every day.  Taking vitamin and mineral supplements as recommended by your health care provider. What happens during an annual well check? The services and screenings done by your health care provider during your annual well check will depend on your age, overall health, lifestyle risk factors, and family history of disease. Counseling  Your health care provider may ask you questions about your:  Alcohol  use.  Tobacco use.  Drug use.  Emotional well-being.  Home and relationship well-being.  Sexual activity.  Eating habits.  History of falls.  Memory and ability to understand (cognition).  Work and work Statistician. Screening  You may have the following tests or measurements:  Height, weight, and BMI.  Blood pressure.  Lipid and cholesterol levels. These may be checked every 5 years, or more frequently if you are over 77 years old.  Skin check.  Lung cancer screening. You may have this screening every year starting at age 2 if you have a 30-pack-year history of smoking and currently smoke or have quit within the past 15 years.  Fecal occult blood test (FOBT) of the stool. You may have this test every year starting at age 42.  Flexible sigmoidoscopy or colonoscopy. You may have a sigmoidoscopy every 5 years or a colonoscopy every 10 years starting at age 40.  Prostate cancer screening. Recommendations will vary depending on your family history and other risks.  Hepatitis C blood test.  Hepatitis B blood test.  Sexually transmitted disease (STD) testing.  Diabetes screening. This is done by checking your blood sugar (glucose) after you have not eaten for a while (fasting). You may have this done every 1-3 years.  Abdominal aortic aneurysm (AAA) screening. You may need this if you are a current or former smoker.  Osteoporosis. You may be screened starting at age 80 if you are at high risk. Talk with your health care provider about your test results, treatment options, and if necessary, the need for more tests. Vaccines  Your health care provider may recommend certain vaccines, such as:  Influenza vaccine. This is recommended every year.  Tetanus, diphtheria, and acellular pertussis (Tdap, Td) vaccine. You may need a Td booster every 10 years.  Zoster vaccine. You may need this after age 19.  Pneumococcal 13-valent conjugate (PCV13) vaccine. One dose is  recommended after age 32.  Pneumococcal polysaccharide (PPSV23) vaccine. One dose is recommended after age 51. Talk to your health care provider about which screenings and vaccines you need and how often you need them. This information is not intended to replace advice given to you by your health care provider. Make sure you discuss any questions you have with your health care provider. Document Released: 12/28/2015 Document Revised: 08/20/2016 Document Reviewed: 10/02/2015 Elsevier Interactive Patient Education  2017 [place] Prevention in the Home Falls can cause injuries. They can happen to people of all ages. There are many things you can do to make your home safe and to help prevent falls. What can I do on the outside of my home?  Regularly fix the edges of walkways and driveways and fix any cracks.  Remove anything that might make you trip as you walk through a door, such as a raised step or threshold.  Trim any bushes or trees on the path to your home.  Use bright outdoor lighting.  Clear any walking paths of anything that might make someone trip, such as rocks or tools.  Regularly check to see if handrails are loose or broken. Make sure that both sides of any steps have handrails.  Any raised decks and porches should have guardrails on the edges.  Have any leaves, snow, or ice cleared regularly.  Use sand or salt on walking paths during winter.  Clean up any spills in your garage right away. This includes oil or grease spills. What can I do in the bathroom?  Use night lights.  Install grab bars by the toilet and in the tub and shower. Do not use towel bars as grab bars.  Use non-skid mats or decals in the tub or shower.  If you need to sit down in the shower, use a plastic, non-slip stool.  Keep the floor dry. Clean up any water that spills on the floor as soon as it happens.  Remove soap buildup in the tub or shower regularly.  Attach bath mats  securely with double-sided non-slip rug tape.  Do not have throw rugs and other things on the floor that can make you trip. What can I do in the bedroom?  Use night lights.  Make sure that you have a light by your bed that is easy to reach.  Do not use any sheets or blankets that are too big for your bed. They should not hang down onto the floor.  Have a firm chair that has side arms. You can use this for support while you get dressed.  Do not have throw rugs and other things on the floor that can make you trip. What can I do in the kitchen?  Clean up any spills right away.  Avoid walking on wet floors.  Keep items that you use a lot in easy-to-reach places.  If you need to reach something above you, use a strong step stool that has a grab bar.  Keep electrical cords out of the way.  Do not use floor polish or wax that makes floors slippery. If you must use wax, use non-skid floor wax.  Do not have throw rugs and other things on the floor that  can make you trip. What can I do with my stairs?  Do not leave any items on the stairs.  Make sure that there are handrails on both sides of the stairs and use them. Fix handrails that are broken or loose. Make sure that handrails are as long as the stairways.  Check any carpeting to make sure that it is firmly attached to the stairs. Fix any carpet that is loose or worn.  Avoid having throw rugs at the top or bottom of the stairs. If you do have throw rugs, attach them to the floor with carpet tape.  Make sure that you have a light switch at the top of the stairs and the bottom of the stairs. If you do not have them, ask someone to add them for you. What else can I do to help prevent falls?  Wear shoes that:  Do not have high heels.  Have rubber bottoms.  Are comfortable and fit you well.  Are closed at the toe. Do not wear sandals.  If you use a stepladder:  Make sure that it is fully opened. Do not climb a closed  stepladder.  Make sure that both sides of the stepladder are locked into place.  Ask someone to hold it for you, if possible.  Clearly mark and make sure that you can see:  Any grab bars or handrails.  First and last steps.  Where the edge of each step is.  Use tools that help you move around (mobility aids) if they are needed. These include:  Canes.  Walkers.  Scooters.  Crutches.  Turn on the lights when you go into a dark area. Replace any light bulbs as soon as they burn out.  Set up your furniture so you have a clear path. Avoid moving your furniture around.  If any of your floors are uneven, fix them.  If there are any pets around you, be aware of where they are.  Review your medicines with your doctor. Some medicines can make you feel dizzy. This can increase your chance of falling. Ask your doctor what other things that you can do to help prevent falls. This information is not intended to replace advice given to you by your health care provider. Make sure you discuss any questions you have with your health care provider. Document Released: 09/27/2009 Document Revised: 05/08/2016 Document Reviewed: 01/05/2015 Elsevier Interactive Patient Education  2017 [employer].

## 2018-08-20 NOTE — Progress Notes (Addendum)
Subjective:   Terry [name] is a 82 y.o. male who presents for Medicare Annual/Subsequent preventive examination.  Review of Systems:  N/A  Cardiac Risk Factors include: advanced age (>4men, >56 women);dyslipidemia;hypertension;male gender     Objective:    Vitals: BP 136/72 (BP Location: Right Arm)   Pulse 84   Temp 97.8 F (36.6 C) (Oral)   Ht 5\' 7"  (1.702 m)   Wt 146 lb (66.2 kg)   BMI 22.87 kg/m   Body mass index is 22.87 kg/m.  Advanced Directives 08/20/2018 07/23/2017 06/21/2015  Does Patient Have a Medical Advance Directive? Yes Yes No  Type of Advance Directive Healthcare Power of [place] in Chart? No - copy requested No - copy requested -  Would patient like information on creating a medical advance directive? - - No - patient declined information    Tobacco Social History   Tobacco Use  Smoking Status Former Smoker  . Packs/day: 1.50  . Years: 50.00  . Pack years: 75.00  . Types: Cigarettes  . Last attempt to quit: 06/18/2005  . Years since quitting: 13.1  Smokeless Tobacco Former Systems developer  . Types: Chew     Counseling given: Not Answered   Clinical Intake:  Pre-visit preparation completed: Yes  Pain : No/denies pain Pain Score: 0-No pain     Nutritional Status: BMI of 19-24  Normal Nutritional Risks: Nausea/ vomitting/ diarrhea(Diarrhea occasionally due to medications. ) Diabetes: No  How often do you need to have someone help you when you read instructions, pamphlets, or other written materials from your doctor or pharmacy?: 1 - Never  Interpreter Needed?: No  Information entered by :: Mmarkoski,LPN  Past Medical History:  Diagnosis Date  . Cancer [facility])    bladder cancer  . Hyperlipidemia   . Hypertension   . Kidney stones   . Status post surgical removal and fulguration of bladder neoplasm    Past Surgical History:  Procedure Laterality Date  . BLADDER SURGERY   06/2015  . deviated nose septum surgery  1970  . EP IMPLANTABLE DEVICE    . HERNIA REPAIR  1980  . single septum port    . stomach ulcer surgery  1994  . vascular stent  2006   Family History  Problem Relation Age of Onset  . Diabetes Mother   . Heart disease Mother   . Hypertension Father   . Alzheimer's disease Father   . Hip fracture Father   . Prostate cancer Paternal Uncle    Social History   Socioeconomic History  . Marital status: Widowed    Spouse name: Not on file  . Number of children: 3  . Years of education: Not on file  . Highest education level: High school graduate  Occupational History  . Occupation: retired  Scientific laboratory technician  . Financial resource strain: Not hard at all  . Food insecurity:    Worry: Never true    Inability: Never true  . Transportation needs:    Medical: No    Non-medical: No  Tobacco Use  . Smoking status: Former Smoker    Packs/day: 1.50    Years: 50.00    Pack years: 75.00    Types: Cigarettes    Last attempt to quit: 06/18/2005    Years since quitting: 13.1  . Smokeless tobacco: Former Systems developer    Types: Chew  Substance and Sexual Activity  . Alcohol use: No  Alcohol/week: 0.0 standard drinks  . Drug use: No  . Sexual activity: Not on file  Lifestyle  . Physical activity:    Days per week: Not on file    Minutes per session: Not on file  . Stress: Not at all  Relationships  . Social connections:    Talks on phone: Not on file    Gets together: Not on file    Attends religious service: Not on file    Active member of club or organization: Not on file    Attends meetings of clubs or organizations: Not on file    Relationship status: Not on file  Other Topics Concern  . Not on file  Social History Narrative  . Not on file    Outpatient Encounter Medications as of 08/20/2018  Medication Sig  . acetaminophen (TYLENOL) 325 MG tablet Take 650 mg by mouth every 6 (six) hours as needed.  Marland Kitchen aspirin 81 MG tablet Take 1 tablet by  mouth daily.  . cetirizine (ZYRTEC) 10 MG tablet Take 10 mg by mouth daily.  . diphenhydramine-acetaminophen (TYLENOL PM) 25-500 MG TABS tablet Take 1 tablet by mouth at bedtime as needed.  . Multiple Vitamin (MULTI-VITAMINS) TABS Take by mouth.  . neomycin-bacitracin-polymyxin (NEOSPORIN) [phone] ointment Apply 1 application topically as needed.  . pravastatin (PRAVACHOL) 20 MG tablet TAKE 1 TABLET EVERY DAY  . triamcinolone cream (KENALOG) 0.1 % Apply 1 application topically 2 (two) times daily.   Marland Kitchen docusate sodium (COLACE) 100 MG capsule Take 100 mg by mouth as needed.   . famotidine (PEPCID) 20 MG tablet Take 20 mg by mouth daily.  Marland Kitchen senna (SENOKOT) 8.6 MG tablet Take by mouth as needed.    No facility-administered encounter medications on file as of 08/20/2018.     Activities of Daily Living In your present state of health, do you have any difficulty performing the following activities: 08/20/2018  Hearing? Y  Comment Does not wear hearing aids.   Vision? N  Difficulty concentrating or making decisions? N  Walking or climbing stairs? N  Dressing or bathing? N  Doing errands, shopping? N  Preparing Food and eating ? N  Using the Toilet? N  In the past six months, have you accidently leaked urine? N  Comment Does not have a bladder.   Do you have problems with loss of bowel control? N  Managing your Medications? N  Managing your Finances? N  Housekeeping or managing your Housekeeping? N  Some recent data might be hidden    Patient Care Team: Chrismon, Vickki Muff, PA as PCP - General (Physician Assistant) Sidney Ace, NP as Nurse Practitioner (Surgical Oncology) Viprakasit, Marlowe [place]ores, MD as Referring Physician (Urology)   Assessment:   This is a routine wellness examination for Terry [name].  Exercise Activities and Dietary recommendations Current Exercise Habits: The patient does not participate in regular exercise at present, Exercise limited by: None identified  Goals    .  decrease soda intake     Recommend decreasing amount of soda intake. Pt to cut out 1 soda a day.     Marland Kitchen DIET - EAT MORE FRUITS AND VEGETABLES     Recommend to eat 1-2 servings of fruit a day.        Fall Risk Fall Risk  08/20/2018 07/23/2017 07/03/2016 06/21/2015  Falls in the past year? No No No No   Is the patient's home free of loose throw rugs in walkways, pet beds, electrical cords, etc?  yes      Grab bars in the bathroom? yes      Handrails on the stairs?   no      Adequate lighting?   yes  Timed Get Up and Go Performed: N/A  Depression Screen PHQ 2/9 Scores 08/20/2018 08/20/2018 07/23/2017 07/23/2017  PHQ - 2 Score 0 0 0 0  PHQ- 9 Score 1 - 2 -    Cognitive Function: Pt declined screening today.         Immunization History  Administered Date(s) Administered  . Pneumococcal Polysaccharide-23 08/15/2015, 09/17/2017  . Tdap 02/01/2013    Qualifies for Shingles Vaccine? Due for Shingles vaccine. Declined my offer to administer today. Education has been provided regarding the importance of this vaccine. Pt has been advised to call her insurance company to determine her out of pocket expense. Advised she may also receive this vaccine at her local pharmacy or Health Dept. Verbalized acceptance and understanding.  Screening Tests Health Maintenance  Topic Date Due  . INFLUENZA VACCINE  03/15/2019 (Originally 07/15/2018)  . PNA vac Low Risk Adult (2 of 2 - PCV13) 09/17/2018  . TETANUS/TDAP  02/01/2023   Cancer Screenings: Lung: Low Dose CT Chest recommended if Age 20-80 years, 30 pack-year currently smoking OR have quit w/in 15years. Patient does qualify, however declines referral today. Colorectal: Up to date  Additional Screenings:  Hepatitis C Screening: N/A      Plan:  I have personally reviewed and addressed the Medicare Annual Wellness questionnaire and have noted the following in the patient's chart:  A. Medical and social history B. Use of alcohol, tobacco or illicit  drugs  C. Current medications and supplements D. Functional ability and status E.  Nutritional status F.  Physical activity G. Advance directives H. List of other physicians I.  Hospitalizations, surgeries, and ER visits in previous 12 months J.  [place] such as hearing and vision if needed, cognitive and depression L. Referrals and appointments - none  In addition, I have reviewed and discussed with patient certain preventive protocols, quality metrics, and best practice recommendations. A written personalized care plan for preventive services as well as general preventive health recommendations were provided to patient.  See attached scanned questionnaire for additional information.   Signed,  Fabio Neighbors, LPN Nurse Health Advisor   Nurse Recommendations: Pt declined the influenza vaccine today.   Reviewed documentation of wellness screening by Nurse Health Advisor. Was available for consultation and agree with recommendations.

## 2018-08-20 NOTE — Progress Notes (Signed)
Patient: Terry [name], Male    DOB: [birth year], 82 y.o.   MRN: [id number] Visit Date: 08/20/2018  Today's Provider: [name], PA   Chief Complaint  Patient presents with  . Medicare Wellness   Subjective:     Complete Physical Terry [name] is a 82 y.o. male. He feels well. He reports exercising regularly. He reports he is sleeping well.  -----------------------------------------------------------   Review of Systems  Constitutional: Negative.   HENT: Negative.   Eyes: Negative.   Respiratory: Negative.   Cardiovascular: Negative.   Gastrointestinal: Positive for diarrhea (Pt reports still having diarrhea on occasion.  He reports it has improved. ). Negative for abdominal distention, abdominal pain, anal bleeding, blood in stool, constipation, nausea, rectal pain and vomiting.  Endocrine: Negative.   Genitourinary: Negative.   Musculoskeletal: Negative.   Skin: Negative.   Allergic/Immunologic: Negative.   Neurological: Negative.   Hematological: Negative.   Psychiatric/Behavioral: Negative.     Social History   Socioeconomic History  . Marital status: Widowed    Spouse name: Not on file  . Number of children: 3  . Years of education: Not on file  . Highest education level: High school graduate  Occupational History  . Occupation: retired  Scientific laboratory technician  . Financial resource strain: Not hard at all  . Food insecurity:    Worry: Never true    Inability: Never true  . Transportation needs:    Medical: No    Non-medical: No  Tobacco Use  . Smoking status: Former Smoker    Packs/day: 1.50    Years: 50.00    Pack years: 75.00    Types: Cigarettes    Last attempt to quit: 06/18/2005    Years since quitting: 13.1  . Smokeless tobacco: Former Systems developer    Types: Chew  Substance and Sexual Activity  . Alcohol use: No    Alcohol/week: 0.0 standard drinks  . Drug use: No  . Sexual activity: Not on file  Lifestyle  . Physical activity:     Days per week: Not on file    Minutes per session: Not on file  . Stress: Not at all  Relationships  . Social connections:    Talks on phone: Not on file    Gets together: Not on file    Attends religious service: Not on file    Active member of club or organization: Not on file    Attends meetings of clubs or organizations: Not on file    Relationship status: Not on file  . Intimate partner violence:    Fear of current or ex partner: Not on file    Emotionally abused: Not on file    Physically abused: Not on file    Forced sexual activity: Not on file  Other Topics Concern  . Not on file  Social History Narrative  . Not on file   Past Medical History:  Diagnosis Date  . Cancer [facility])    bladder cancer  . Hyperlipidemia   . Hypertension   . Kidney stones   . Status post surgical removal and fulguration of bladder neoplasm     Patient Active Problem List   Diagnosis Date Noted  . S/P ileal conduit ([place]) 08/07/2015  . History of surgical procedure 08/07/2015  . Bladder cancer ([place]) 06/21/2015  . Cancer of lateral wall of urinary bladder ([place]) 04/26/2015  . Malignant neoplasm of lateral wall of urinary bladder ([place]) 04/26/2015  . Arteriosclerosis  of coronary artery 04/24/2015  . Blood pressure elevated 04/24/2015  . HLD (hyperlipidemia) 04/24/2015  . Calculus of kidney 04/24/2015  . Psoriasis 04/24/2015  . Gastric ulcer 04/24/2015  . Pain in soft tissues of limb 04/24/2015  . Abnormal LFTs 04/24/2015  . Benign essential HTN 03/27/2015  . Combined fat and carbohydrate induced hyperlipemia 03/27/2015  . Breathlessness on exertion 03/27/2015   Past Surgical History:  Procedure Laterality Date  . BLADDER SURGERY  06/2015  . deviated nose septum surgery  1970  . EP IMPLANTABLE DEVICE    . HERNIA REPAIR  1980  . single septum port    . stomach ulcer surgery  1994  . vascular stent  2006   His family history includes Alzheimer's disease in his father; Diabetes in  his mother; Heart disease in his mother; Hip fracture in his father; Hypertension in his father; Prostate cancer in his paternal uncle.     Current Outpatient Medications:  .  acetaminophen (TYLENOL) 325 MG tablet, Take 650 mg by mouth every 6 (six) hours as needed., Disp: , Rfl:  .  aspirin 81 MG tablet, Take 1 tablet by mouth daily., Disp: , Rfl:  .  cetirizine (ZYRTEC) 10 MG tablet, Take 10 mg by mouth daily., Disp: , Rfl:  .  diphenhydramine-acetaminophen (TYLENOL PM) 25-500 MG TABS tablet, Take 1 tablet by mouth at bedtime as needed., Disp: , Rfl:  .  docusate sodium (COLACE) 100 MG capsule, Take 100 mg by mouth as needed. , Disp: , Rfl:  .  famotidine (PEPCID) 20 MG tablet, Take 20 mg by mouth daily., Disp: , Rfl:  .  Multiple Vitamin (MULTI-VITAMINS) TABS, Take by mouth., Disp: , Rfl:  .  neomycin-bacitracin-polymyxin (NEOSPORIN) [phone] ointment, Apply 1 application topically as needed., Disp: , Rfl:  .  pravastatin (PRAVACHOL) 20 MG tablet, TAKE 1 TABLET EVERY DAY, Disp: 90 tablet, Rfl: 3 .  senna (SENOKOT) 8.6 MG tablet, Take by mouth as needed. , Disp: , Rfl:  .  triamcinolone cream (KENALOG) 0.1 %, Apply 1 application topically 2 (two) times daily. , Disp: , Rfl:   Patient Care Team: Chrismon, Vickki Muff, PA as PCP - General (Physician Assistant) Sidney Ace, NP as Nurse Practitioner (Surgical Oncology) Viprakasit, Marlowe [place]ores, MD as Referring Physician (Urology)    Objective:   Vitals: BP 136/72   Pulse 84   Temp 97.8 F (36.6 C) (Oral)   Ht _0  (1.702 m)   Wt 146 lb (66.2 kg)   BMI 22.87 kg/m   Physical Exam  Constitutional: He is oriented to person, place, and time. He appears well-developed and well-nourished.  HENT:  Head: Normocephalic and atraumatic.  Right Ear: External ear normal.  Left Ear: External ear normal.  Nose: Nose normal.  Mouth/Throat: Oropharynx is clear and moist.  Eyes: Pupils are equal, round, and reactive to light. Conjunctivae and EOM  are normal. Right eye exhibits no discharge.  Neck: Normal range of motion. Neck supple. No tracheal deviation present. No thyromegaly present.  Cardiovascular: Normal rate, regular rhythm, normal heart sounds and intact distal pulses.  No murmur heard. Pulmonary/Chest: Effort normal and breath sounds normal. No respiratory distress. He has no wheezes. He has no rales. He exhibits no tenderness.  Right upper chest port for chemotherapy. No sign of infection. No pain.  Abdominal: Soft. He exhibits no distension and no mass. There is no tenderness. There is no rebound and no guarding.  Genitourinary:  Genitourinary Comments: Deferred to urology oncologist.  Has right lower quadrant ileal conduit ostomy.  Musculoskeletal: Normal range of motion. He exhibits no edema or tenderness.  Lymphadenopathy:    He has no cervical adenopathy.  Neurological: He is alert and oriented to person, place, and time. He has normal reflexes. He displays normal reflexes. No cranial nerve deficit. He exhibits normal muscle tone. Coordination normal.  Skin: Skin is warm and dry. No rash noted. No erythema.  Psychiatric: He has a normal mood and affect. His behavior is normal. Judgment and thought content normal.    Activities of Daily Living In your present state of health, do you have any difficulty performing the following activities: 08/20/2018  Hearing? Y  Comment Does not wear hearing aids.   Vision? N  Difficulty concentrating or making decisions? N  Walking or climbing stairs? N  Dressing or bathing? N  Doing errands, shopping? N  Preparing Food and eating ? N  Using the Toilet? N  In the past six months, have you accidently leaked urine? N  Comment Does not have a bladder.   Do you have problems with loss of bowel control? N  Managing your Medications? N  Managing your Finances? N  Housekeeping or managing your Housekeeping? N  Some recent data might be hidden   Fall Risk Assessment Fall Risk   08/20/2018 07/23/2017 07/03/2016 06/21/2015  Falls in the past year? No No No No    Depression Screen PHQ 2/9 Scores 08/20/2018 08/20/2018 07/23/2017 07/23/2017  PHQ - 2 Score 0 0 0 0  PHQ- 9 Score 1 - 2 -   Assessment & Plan:    Annual Physical Reviewed patient's Family Medical History Reviewed and updated list of patient's medical providers Assessment of cognitive impairment was done Assessed patient's functional ability Established a written schedule for health screening [place] Completed and Reviewed  Exercise Activities and Dietary recommendations Goals    . decrease soda intake     Recommend decreasing amount of soda intake. Pt to cut out 1 soda a day.     Marland Kitchen DIET - EAT MORE FRUITS AND VEGETABLES     Recommend to eat 1-2 servings of fruit a day.        Immunization History  Administered Date(s) Administered  . Pneumococcal Polysaccharide-23 08/15/2015, 09/17/2017  . Tdap 02/01/2013    Health Maintenance  Topic Date Due  . INFLUENZA VACCINE  03/15/2019 (Originally 07/15/2018)  . PNA vac Low Risk Adult (2 of 2 - PCV13) 09/17/2018  . TETANUS/TDAP  02/01/2023    Discussed health benefits of physical activity, and encouraged him to engage in regular exercise appropriate for his age and condition.    ------------------------------------------------------------------------------------------------------------ 1. Pure hypercholesterolemia Tolerating the Pravastatin without side effects. Labs by [facility] for follow up of bladder cancer - Hgb 12.7, WBC 8600, Ca+ 9.5, Albumin 3.8, AST 25, ALT 29, EGFR 61, TSH 2.438, Total Protein 6.8, Na+ 140, K+ 3.8. Chloride 110, BUN 14, Cr 1.13 and glucose 102. Last colonoscopy was 07-31-17 with only diverticulosis - no masses. Got pneumonia vaccination at [facility]. Refuses flu shot. Recheck Lipid Panel and continue present Pravastatin dosage. - Lipid panel  2. Benign essential HTN Stable control and not on any antihypertensives at  the present. Diet controlled with sodium restriction. - Lipid panel  3. HLD (hyperlipidemia) Trying to follow low fat diet and tolerating Pravastatin 20 mg qd. Will recheck lipid panel and refill the Pravastatin - Lipid panel - pravastatin (PRAVACHOL) 20 MG tablet; Take 1 tablet (20  mg total) by mouth daily.  Dispense: 90 tablet; Refill: 3  4. Cancer of lateral wall of urinary bladder (HCC) Had his cycle 28 of Pembrolizumab 08-12-18 at [facility]). Feeling well but still has a little diarrhea occasionally (controled with Imodium). S/P ileal conduit with drainage to a bag for h/o [id number] bladder cancer diagnosed 07-03-15 with GS 3+3 prostate cancer on pathology. Continue follow up with oncology as planned for further chemo through the right upper chest port.  5. S/P ileal conduit ([place]) Patent and operating well. No infections and followed by oncologist.    [name], [place]

## 2018-08-21 LAB — LIPID PANEL
CHOL/HDL RATIO: 2.9 ratio (ref 0.0–5.0)
Cholesterol, Total: 176 mg/dL (ref 100–199)
HDL: 60 mg/dL (ref >39)
LDL CALC: 104 mg/dL — AB (ref 0–99)
Triglycerides: 59 mg/dL (ref 0–149)
VLDL Cholesterol Cal: 12 mg/dL (ref 5–40)

## 2018-08-24 ENCOUNTER — Telehealth: Payer: Self-pay

## 2018-08-24 NOTE — Telephone Encounter (Signed)
Left message advising pt.  (Per DPR)  Thanks,   -Laura  

## 2018-08-24 NOTE — Telephone Encounter (Signed)
-----   Message from Margo Common, [place] sent at 08/24/2018  1:39 PM EDT ----- Very good control of cholesterol. Continue Pravastatin daily with low fat diet. Recheck in 6 months.

## 2018-09-02 DIAGNOSIS — I119 Hypertensive heart disease without heart failure: Secondary | ICD-10-CM | POA: Diagnosis not present

## 2018-09-02 DIAGNOSIS — C672 Malignant neoplasm of lateral wall of bladder: Secondary | ICD-10-CM | POA: Diagnosis not present

## 2018-09-02 DIAGNOSIS — Z8719 Personal history of other diseases of the digestive system: Secondary | ICD-10-CM | POA: Diagnosis not present

## 2018-09-02 DIAGNOSIS — Z5112 Encounter for antineoplastic immunotherapy: Secondary | ICD-10-CM | POA: Diagnosis not present

## 2018-09-02 DIAGNOSIS — Z79899 Other long term (current) drug therapy: Secondary | ICD-10-CM | POA: Diagnosis not present

## 2018-09-02 DIAGNOSIS — K589 Irritable bowel syndrome without diarrhea: Secondary | ICD-10-CM | POA: Diagnosis not present

## 2018-09-02 DIAGNOSIS — Z955 Presence of coronary angioplasty implant and graft: Secondary | ICD-10-CM | POA: Diagnosis not present

## 2018-09-28 DIAGNOSIS — N134 Hydroureter: Secondary | ICD-10-CM | POA: Diagnosis not present

## 2018-09-28 DIAGNOSIS — K435 Parastomal hernia without obstruction or  gangrene: Secondary | ICD-10-CM | POA: Diagnosis not present

## 2018-09-28 DIAGNOSIS — C672 Malignant neoplasm of lateral wall of bladder: Secondary | ICD-10-CM | POA: Diagnosis not present

## 2018-09-28 DIAGNOSIS — N1330 Unspecified hydronephrosis: Secondary | ICD-10-CM | POA: Diagnosis not present

## 2018-09-28 DIAGNOSIS — N132 Hydronephrosis with renal and ureteral calculous obstruction: Secondary | ICD-10-CM | POA: Diagnosis not present

## 2018-09-30 DIAGNOSIS — I2510 Atherosclerotic heart disease of native coronary artery without angina pectoris: Secondary | ICD-10-CM | POA: Diagnosis not present

## 2018-09-30 DIAGNOSIS — C61 Malignant neoplasm of prostate: Secondary | ICD-10-CM | POA: Diagnosis not present

## 2018-09-30 DIAGNOSIS — Z923 Personal history of irradiation: Secondary | ICD-10-CM | POA: Diagnosis not present

## 2018-09-30 DIAGNOSIS — Z5112 Encounter for antineoplastic immunotherapy: Secondary | ICD-10-CM | POA: Diagnosis not present

## 2018-09-30 DIAGNOSIS — C799 Secondary malignant neoplasm of unspecified site: Secondary | ICD-10-CM | POA: Diagnosis not present

## 2018-09-30 DIAGNOSIS — C672 Malignant neoplasm of lateral wall of bladder: Secondary | ICD-10-CM | POA: Diagnosis not present

## 2018-09-30 DIAGNOSIS — K589 Irritable bowel syndrome without diarrhea: Secondary | ICD-10-CM | POA: Diagnosis not present

## 2018-09-30 DIAGNOSIS — I10 Essential (primary) hypertension: Secondary | ICD-10-CM | POA: Diagnosis not present

## 2018-09-30 DIAGNOSIS — N2882 Megaloureter: Secondary | ICD-10-CM | POA: Diagnosis not present

## 2018-10-02 DIAGNOSIS — Z936 Other artificial openings of urinary tract status: Secondary | ICD-10-CM | POA: Diagnosis not present

## 2018-10-02 DIAGNOSIS — C7911 Secondary malignant neoplasm of bladder: Secondary | ICD-10-CM | POA: Diagnosis not present

## 2018-10-21 DIAGNOSIS — Z87891 Personal history of nicotine dependence: Secondary | ICD-10-CM | POA: Diagnosis not present

## 2018-10-21 DIAGNOSIS — I10 Essential (primary) hypertension: Secondary | ICD-10-CM | POA: Diagnosis not present

## 2018-10-21 DIAGNOSIS — C779 Secondary and unspecified malignant neoplasm of lymph node, unspecified: Secondary | ICD-10-CM | POA: Diagnosis not present

## 2018-10-21 DIAGNOSIS — C672 Malignant neoplasm of lateral wall of bladder: Secondary | ICD-10-CM | POA: Diagnosis not present

## 2018-10-21 DIAGNOSIS — Z801 Family history of malignant neoplasm of trachea, bronchus and lung: Secondary | ICD-10-CM | POA: Diagnosis not present

## 2018-10-21 DIAGNOSIS — Z5112 Encounter for antineoplastic immunotherapy: Secondary | ICD-10-CM | POA: Diagnosis not present

## 2018-10-21 DIAGNOSIS — N2882 Megaloureter: Secondary | ICD-10-CM | POA: Diagnosis not present

## 2018-10-21 DIAGNOSIS — C61 Malignant neoplasm of prostate: Secondary | ICD-10-CM | POA: Diagnosis not present

## 2018-10-21 DIAGNOSIS — I2510 Atherosclerotic heart disease of native coronary artery without angina pectoris: Secondary | ICD-10-CM | POA: Diagnosis not present

## 2018-11-03 DIAGNOSIS — C7911 Secondary malignant neoplasm of bladder: Secondary | ICD-10-CM | POA: Diagnosis not present

## 2018-11-03 DIAGNOSIS — Z936 Other artificial openings of urinary tract status: Secondary | ICD-10-CM | POA: Diagnosis not present

## 2018-11-17 DIAGNOSIS — R634 Abnormal weight loss: Secondary | ICD-10-CM | POA: Diagnosis not present

## 2018-11-17 DIAGNOSIS — N1330 Unspecified hydronephrosis: Secondary | ICD-10-CM | POA: Diagnosis not present

## 2018-11-17 DIAGNOSIS — C61 Malignant neoplasm of prostate: Secondary | ICD-10-CM | POA: Diagnosis not present

## 2018-11-17 DIAGNOSIS — C672 Malignant neoplasm of lateral wall of bladder: Secondary | ICD-10-CM | POA: Diagnosis not present

## 2018-11-17 DIAGNOSIS — R8290 Unspecified abnormal findings in urine: Secondary | ICD-10-CM | POA: Diagnosis not present

## 2018-11-17 DIAGNOSIS — R7989 Other specified abnormal findings of blood chemistry: Secondary | ICD-10-CM | POA: Diagnosis not present

## 2018-11-17 DIAGNOSIS — C775 Secondary and unspecified malignant neoplasm of intrapelvic lymph nodes: Secondary | ICD-10-CM | POA: Diagnosis not present

## 2018-11-17 DIAGNOSIS — R82998 Other abnormal findings in urine: Secondary | ICD-10-CM | POA: Diagnosis not present

## 2018-11-17 DIAGNOSIS — R530 Neoplastic (malignant) related fatigue: Secondary | ICD-10-CM | POA: Diagnosis not present

## 2018-11-17 DIAGNOSIS — R110 Nausea: Secondary | ICD-10-CM | POA: Diagnosis not present

## 2018-11-24 DIAGNOSIS — K580 Irritable bowel syndrome with diarrhea: Secondary | ICD-10-CM | POA: Diagnosis not present

## 2018-11-24 DIAGNOSIS — Z87891 Personal history of nicotine dependence: Secondary | ICD-10-CM | POA: Diagnosis not present

## 2018-11-24 DIAGNOSIS — C61 Malignant neoplasm of prostate: Secondary | ICD-10-CM | POA: Diagnosis not present

## 2018-11-24 DIAGNOSIS — Z936 Other artificial openings of urinary tract status: Secondary | ICD-10-CM | POA: Diagnosis not present

## 2018-11-24 DIAGNOSIS — C672 Malignant neoplasm of lateral wall of bladder: Secondary | ICD-10-CM | POA: Diagnosis not present

## 2018-11-24 DIAGNOSIS — I2510 Atherosclerotic heart disease of native coronary artery without angina pectoris: Secondary | ICD-10-CM | POA: Diagnosis not present

## 2018-11-24 DIAGNOSIS — N2882 Megaloureter: Secondary | ICD-10-CM | POA: Diagnosis not present

## 2018-11-24 DIAGNOSIS — Z960 Presence of urogenital implants: Secondary | ICD-10-CM | POA: Diagnosis not present

## 2018-11-24 DIAGNOSIS — C775 Secondary and unspecified malignant neoplasm of intrapelvic lymph nodes: Secondary | ICD-10-CM | POA: Diagnosis not present

## 2018-11-24 DIAGNOSIS — C679 Malignant neoplasm of bladder, unspecified: Secondary | ICD-10-CM | POA: Diagnosis not present

## 2019-01-10 DIAGNOSIS — C672 Malignant neoplasm of lateral wall of bladder: Secondary | ICD-10-CM | POA: Diagnosis not present

## 2019-01-10 DIAGNOSIS — R937 Abnormal findings on diagnostic imaging of other parts of musculoskeletal system: Secondary | ICD-10-CM | POA: Diagnosis not present

## 2019-01-10 DIAGNOSIS — N132 Hydronephrosis with renal and ureteral calculous obstruction: Secondary | ICD-10-CM | POA: Diagnosis not present

## 2019-01-10 DIAGNOSIS — N134 Hydroureter: Secondary | ICD-10-CM | POA: Diagnosis not present

## 2019-01-10 DIAGNOSIS — Z9079 Acquired absence of other genital organ(s): Secondary | ICD-10-CM | POA: Diagnosis not present

## 2019-01-10 DIAGNOSIS — J439 Emphysema, unspecified: Secondary | ICD-10-CM | POA: Diagnosis not present

## 2019-01-10 DIAGNOSIS — N131 Hydronephrosis with ureteral stricture, not elsewhere classified: Secondary | ICD-10-CM | POA: Diagnosis not present

## 2019-01-10 DIAGNOSIS — Z906 Acquired absence of other parts of urinary tract: Secondary | ICD-10-CM | POA: Diagnosis not present

## 2019-01-12 DIAGNOSIS — C672 Malignant neoplasm of lateral wall of bladder: Secondary | ICD-10-CM | POA: Diagnosis not present

## 2019-01-25 DIAGNOSIS — N200 Calculus of kidney: Secondary | ICD-10-CM | POA: Diagnosis not present

## 2019-01-25 DIAGNOSIS — N132 Hydronephrosis with renal and ureteral calculous obstruction: Secondary | ICD-10-CM | POA: Diagnosis not present

## 2019-01-25 DIAGNOSIS — N201 Calculus of ureter: Secondary | ICD-10-CM | POA: Diagnosis not present

## 2019-02-01 DIAGNOSIS — Z936 Other artificial openings of urinary tract status: Secondary | ICD-10-CM | POA: Diagnosis not present

## 2019-02-01 DIAGNOSIS — C7911 Secondary malignant neoplasm of bladder: Secondary | ICD-10-CM | POA: Diagnosis not present

## 2019-02-11 DIAGNOSIS — Z87891 Personal history of nicotine dependence: Secondary | ICD-10-CM | POA: Diagnosis not present

## 2019-02-11 DIAGNOSIS — I2510 Atherosclerotic heart disease of native coronary artery without angina pectoris: Secondary | ICD-10-CM | POA: Diagnosis not present

## 2019-02-11 DIAGNOSIS — Z79899 Other long term (current) drug therapy: Secondary | ICD-10-CM | POA: Diagnosis not present

## 2019-02-11 DIAGNOSIS — J439 Emphysema, unspecified: Secondary | ICD-10-CM | POA: Diagnosis not present

## 2019-02-11 DIAGNOSIS — E785 Hyperlipidemia, unspecified: Secondary | ICD-10-CM | POA: Diagnosis not present

## 2019-02-11 DIAGNOSIS — N200 Calculus of kidney: Secondary | ICD-10-CM | POA: Diagnosis not present

## 2019-02-11 DIAGNOSIS — N189 Chronic kidney disease, unspecified: Secondary | ICD-10-CM | POA: Diagnosis not present

## 2019-02-11 DIAGNOSIS — I129 Hypertensive chronic kidney disease with stage 1 through stage 4 chronic kidney disease, or unspecified chronic kidney disease: Secondary | ICD-10-CM | POA: Diagnosis not present

## 2019-02-11 DIAGNOSIS — Z7982 Long term (current) use of aspirin: Secondary | ICD-10-CM | POA: Diagnosis not present

## 2019-02-18 DIAGNOSIS — I10 Essential (primary) hypertension: Secondary | ICD-10-CM | POA: Diagnosis not present

## 2019-02-18 DIAGNOSIS — E785 Hyperlipidemia, unspecified: Secondary | ICD-10-CM | POA: Diagnosis not present

## 2019-02-18 DIAGNOSIS — N200 Calculus of kidney: Secondary | ICD-10-CM | POA: Diagnosis not present

## 2019-02-18 DIAGNOSIS — I2510 Atherosclerotic heart disease of native coronary artery without angina pectoris: Secondary | ICD-10-CM | POA: Diagnosis not present

## 2019-02-18 DIAGNOSIS — J439 Emphysema, unspecified: Secondary | ICD-10-CM | POA: Diagnosis not present

## 2019-02-18 DIAGNOSIS — Z87891 Personal history of nicotine dependence: Secondary | ICD-10-CM | POA: Diagnosis not present

## 2019-02-19 DIAGNOSIS — J439 Emphysema, unspecified: Secondary | ICD-10-CM | POA: Diagnosis not present

## 2019-02-19 DIAGNOSIS — I10 Essential (primary) hypertension: Secondary | ICD-10-CM | POA: Diagnosis not present

## 2019-02-19 DIAGNOSIS — E785 Hyperlipidemia, unspecified: Secondary | ICD-10-CM | POA: Diagnosis not present

## 2019-02-19 DIAGNOSIS — I2510 Atherosclerotic heart disease of native coronary artery without angina pectoris: Secondary | ICD-10-CM | POA: Diagnosis not present

## 2019-02-19 DIAGNOSIS — Z87891 Personal history of nicotine dependence: Secondary | ICD-10-CM | POA: Diagnosis not present

## 2019-02-19 DIAGNOSIS — N200 Calculus of kidney: Secondary | ICD-10-CM | POA: Diagnosis not present

## 2019-02-19 MED ORDER — TAMSULOSIN HCL 0.4 MG PO CAPS
0.40 | ORAL_CAPSULE | ORAL | Status: DC
Start: 2019-02-19 — End: 2019-02-19

## 2019-02-19 MED ORDER — DEXTROSE-NACL 5-0.45 % IV SOLN
100.00 | INTRAVENOUS | Status: DC
Start: ? — End: 2019-02-19

## 2019-02-19 MED ORDER — LEVOFLOXACIN 750 MG PO TABS
750.00 | ORAL_TABLET | ORAL | Status: DC
Start: 2019-02-20 — End: 2019-02-19

## 2019-02-19 MED ORDER — ONDANSETRON 4 MG PO TBDP
4.00 | ORAL_TABLET | ORAL | Status: DC
Start: ? — End: 2019-02-19

## 2019-02-19 MED ORDER — DOCUSATE SODIUM 100 MG PO CAPS
100.00 | ORAL_CAPSULE | ORAL | Status: DC
Start: 2019-02-20 — End: 2019-02-19

## 2019-02-19 MED ORDER — MORPHINE SULFATE 2 MG/ML IJ SOLN
1.00 | INTRAMUSCULAR | Status: DC
Start: ? — End: 2019-02-19

## 2019-02-19 MED ORDER — PRAVASTATIN SODIUM 20 MG PO TABS
20.00 | ORAL_TABLET | ORAL | Status: DC
Start: 2019-02-19 — End: 2019-02-19

## 2019-02-19 MED ORDER — ACETAMINOPHEN 500 MG PO TABS
1000.00 | ORAL_TABLET | ORAL | Status: DC
Start: 2019-02-19 — End: 2019-02-19

## 2019-04-08 DIAGNOSIS — N1330 Unspecified hydronephrosis: Secondary | ICD-10-CM | POA: Diagnosis not present

## 2019-04-08 DIAGNOSIS — N132 Hydronephrosis with renal and ureteral calculous obstruction: Secondary | ICD-10-CM | POA: Diagnosis not present

## 2019-04-08 DIAGNOSIS — N200 Calculus of kidney: Secondary | ICD-10-CM | POA: Diagnosis not present

## 2019-04-14 DIAGNOSIS — C762 Malignant neoplasm of abdomen: Secondary | ICD-10-CM | POA: Diagnosis not present

## 2019-04-14 DIAGNOSIS — C672 Malignant neoplasm of lateral wall of bladder: Secondary | ICD-10-CM | POA: Diagnosis not present

## 2019-04-14 DIAGNOSIS — M7989 Other specified soft tissue disorders: Secondary | ICD-10-CM | POA: Diagnosis not present

## 2019-04-14 DIAGNOSIS — N134 Hydroureter: Secondary | ICD-10-CM | POA: Diagnosis not present

## 2019-04-14 DIAGNOSIS — K435 Parastomal hernia without obstruction or  gangrene: Secondary | ICD-10-CM | POA: Diagnosis not present

## 2019-04-14 DIAGNOSIS — Z9889 Other specified postprocedural states: Secondary | ICD-10-CM | POA: Diagnosis not present

## 2019-04-14 DIAGNOSIS — N132 Hydronephrosis with renal and ureteral calculous obstruction: Secondary | ICD-10-CM | POA: Diagnosis not present

## 2019-05-02 ENCOUNTER — Ambulatory Visit: Payer: Self-pay | Admitting: Pharmacist

## 2019-05-02 NOTE — Chronic Care Management (AMB) (Signed)
°  Chronic Care Management   Outreach Note  05/02/2019 Name: Terry [name] [facility] MRN: [id number] DOB: 09-22-1936  Referred by: Margo Common, PA Reason for referral : No chief complaint on file.   An unsuccessful telephone outreach was attempted today. The patient was referred to the case management team by for assistance with chronic care management and care coordination.   Follow Up Plan: A HIPPA compliant phone message was left for the patient providing contact information and requesting a return call.  The CM team will reach out to the patient again over the next 7 days.  [place]  [email or web address]   ??[phone]

## 2019-05-05 DIAGNOSIS — Z936 Other artificial openings of urinary tract status: Secondary | ICD-10-CM | POA: Diagnosis not present

## 2019-05-05 DIAGNOSIS — C7911 Secondary malignant neoplasm of bladder: Secondary | ICD-10-CM | POA: Diagnosis not present

## 2019-05-06 DIAGNOSIS — Z936 Other artificial openings of urinary tract status: Secondary | ICD-10-CM | POA: Diagnosis not present

## 2019-05-06 DIAGNOSIS — C7911 Secondary malignant neoplasm of bladder: Secondary | ICD-10-CM | POA: Diagnosis not present

## 2019-05-10 NOTE — Progress Notes (Signed)
°  Chronic Care Management   Outreach Note  05/10/2019 Name: Terry [name] [facility] MRN: [id number] DOB: 1936-11-24  Referred by: Margo Common, PA Reason for referral : Chronic Care Management (Initial outreach call was unsuccessful.)   A second unsuccessful telephone outreach was attempted today. The patient was referred to the case management team for assistance with chronic care management and care coordination.   Follow Up Plan: The CM team will reach out to the patient again over the next 7 days.   [place]  [email or web address]   ??[phone]

## 2019-05-13 DIAGNOSIS — Z936 Other artificial openings of urinary tract status: Secondary | ICD-10-CM | POA: Diagnosis not present

## 2019-05-13 DIAGNOSIS — C7911 Secondary malignant neoplasm of bladder: Secondary | ICD-10-CM | POA: Diagnosis not present

## 2019-05-17 ENCOUNTER — Telehealth: Payer: Self-pay | Admitting: Family Medicine

## 2019-05-17 ENCOUNTER — Ambulatory Visit: Payer: Self-pay | Admitting: Family Medicine

## 2019-05-17 NOTE — Chronic Care Management (AMB) (Signed)
Chronic Care Management   Note  05/17/2019 Name: Terry [name] [facility] MRN: [id number] DOB: Feb 13, 1936  Terry [name] is a 83 y.o. year old male who is a primary care patient of Terry [name], Terry [name], Terry [name]. I reached out to [facility] by phone today in response to a referral sent by Terry [name]'s health plan.    Terry [name] was given information about Chronic Care Management services today including:  1. CCM service includes personalized support from designated clinical staff supervised by his physician, including individualized plan of care and coordination with other care providers 2. 24/7 contact phone numbers for assistance for urgent and routine care needs. 3. Service will only be billed when office clinical staff spend 20 minutes or more in a month to coordinate care. 4. Only one practitioner may furnish and bill the service in a calendar month. 5. The patient may stop CCM services at any time (effective at the end of the month) by phone call to the office staff. 6. The patient will be responsible for cost sharing (co-pay) of up to 20% of the service fee (after annual deductible is met).  Patient did not agree to enrollment in care management services and does not wish to consider at this time.  Follow up plan: The patient has been provided with contact information for the chronic care management team and has been advised to call with any health related questions or concerns.   [place]  [email or web address]   ??[phone]

## 2019-05-17 NOTE — Telephone Encounter (Signed)
@  [place] Chronic Care Management   Outreach Note  05/17/2019 Name: Rohail Klees [facility] MRN: [id number] DOB: 07/12/1936  Referred by: Margo Common, PA Reason for referral : Chronic Care Management (Second CCM outreach was unsuccessful. )   A second unsuccessful telephone outreach was attempted today. The patient was referred to the case management team for assistance with chronic care management and care coordination.   Follow Up Plan: A HIPPA compliant phone message was left for the patient providing contact information and requesting a return call.  The care management team will reach out to the patient again over the next 7 days.  If patient returns call to provider office, please advise to call [facility] at [place]  [email or web address]   ??[phone]

## 2019-05-24 DIAGNOSIS — Z936 Other artificial openings of urinary tract status: Secondary | ICD-10-CM | POA: Diagnosis not present

## 2019-05-24 DIAGNOSIS — C7911 Secondary malignant neoplasm of bladder: Secondary | ICD-10-CM | POA: Diagnosis not present

## 2019-06-05 IMAGING — CR DG HIP (WITH OR WITHOUT PELVIS) 2-3V*R*
1 series · 3 of 3 positions shown · non-contrast
Comparison: CT 03/16/2015.

CLINICAL DATA: Right hip pain in the trochanteric region.

EXAM:
DG HIP (WITH OR WITHOUT PELVIS) 2-3V RIGHT

[Series 1: dg hip unilat w or w/o pelvis 2-3 views  · non-contrast · 0.14mm/px · 3 of 3 slices shown]
[im 1/3]
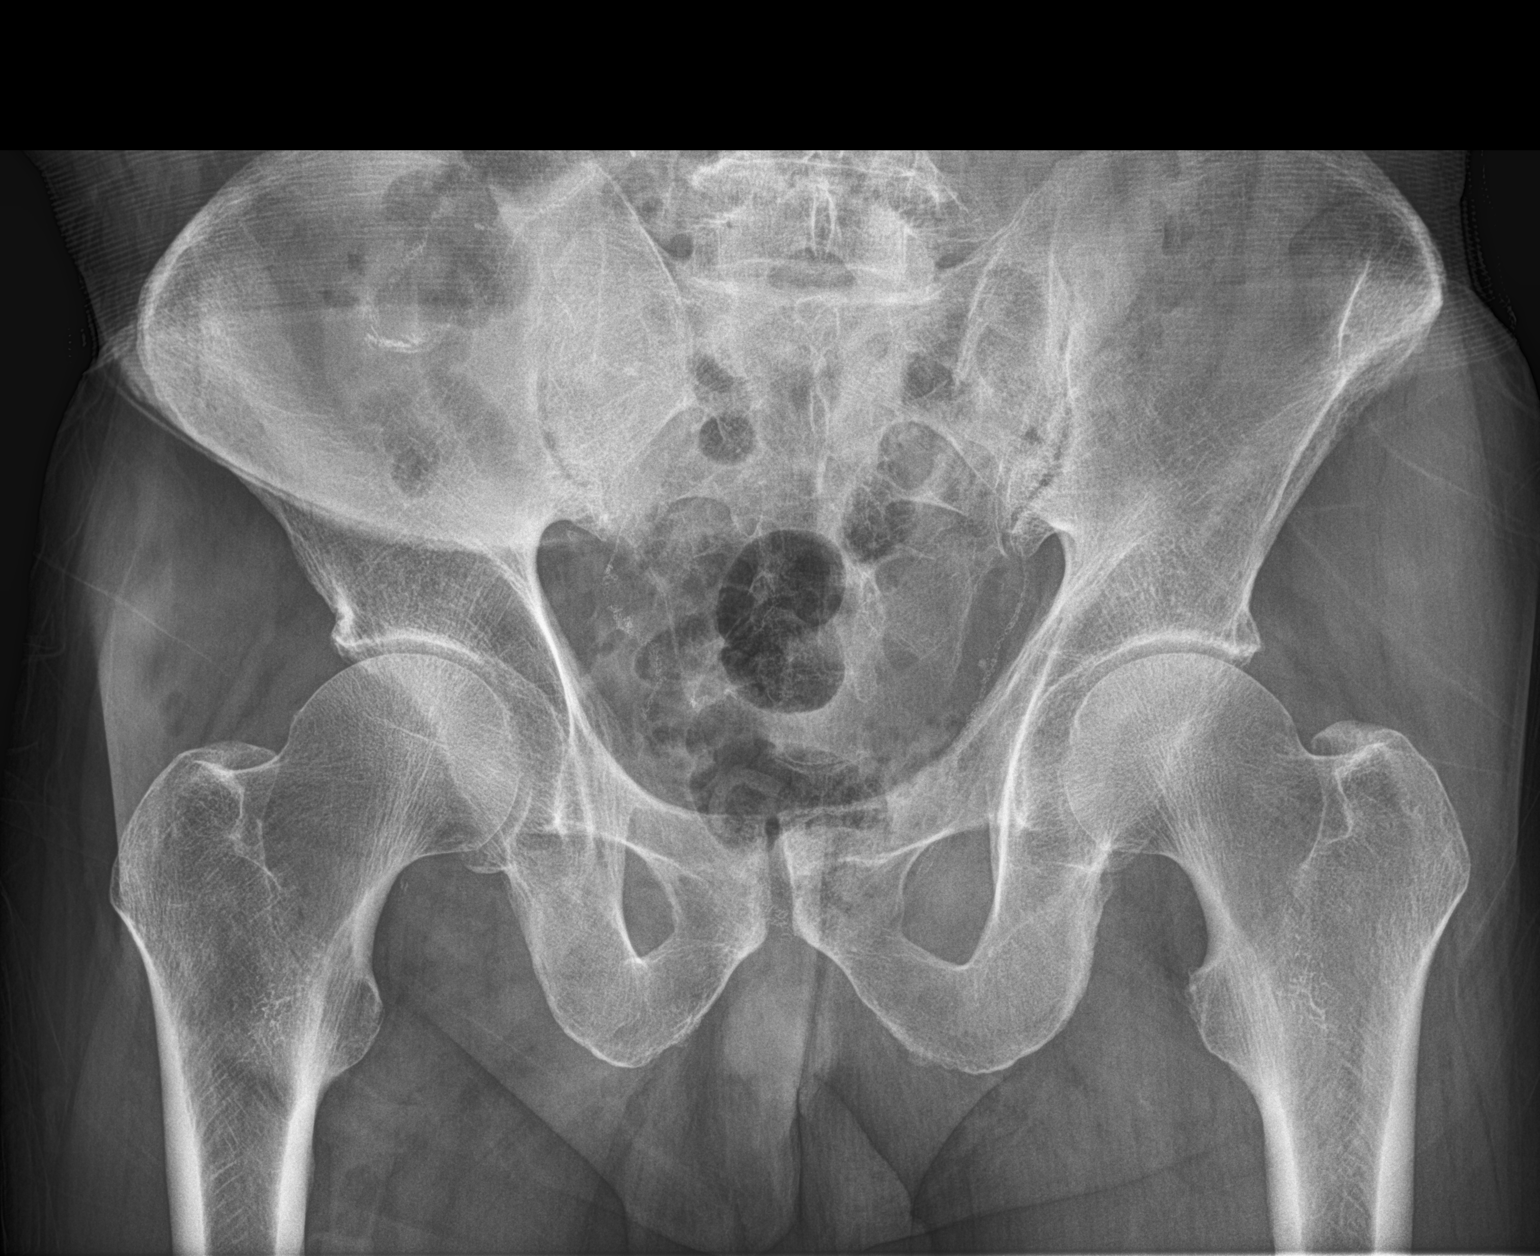
[im 2/3]
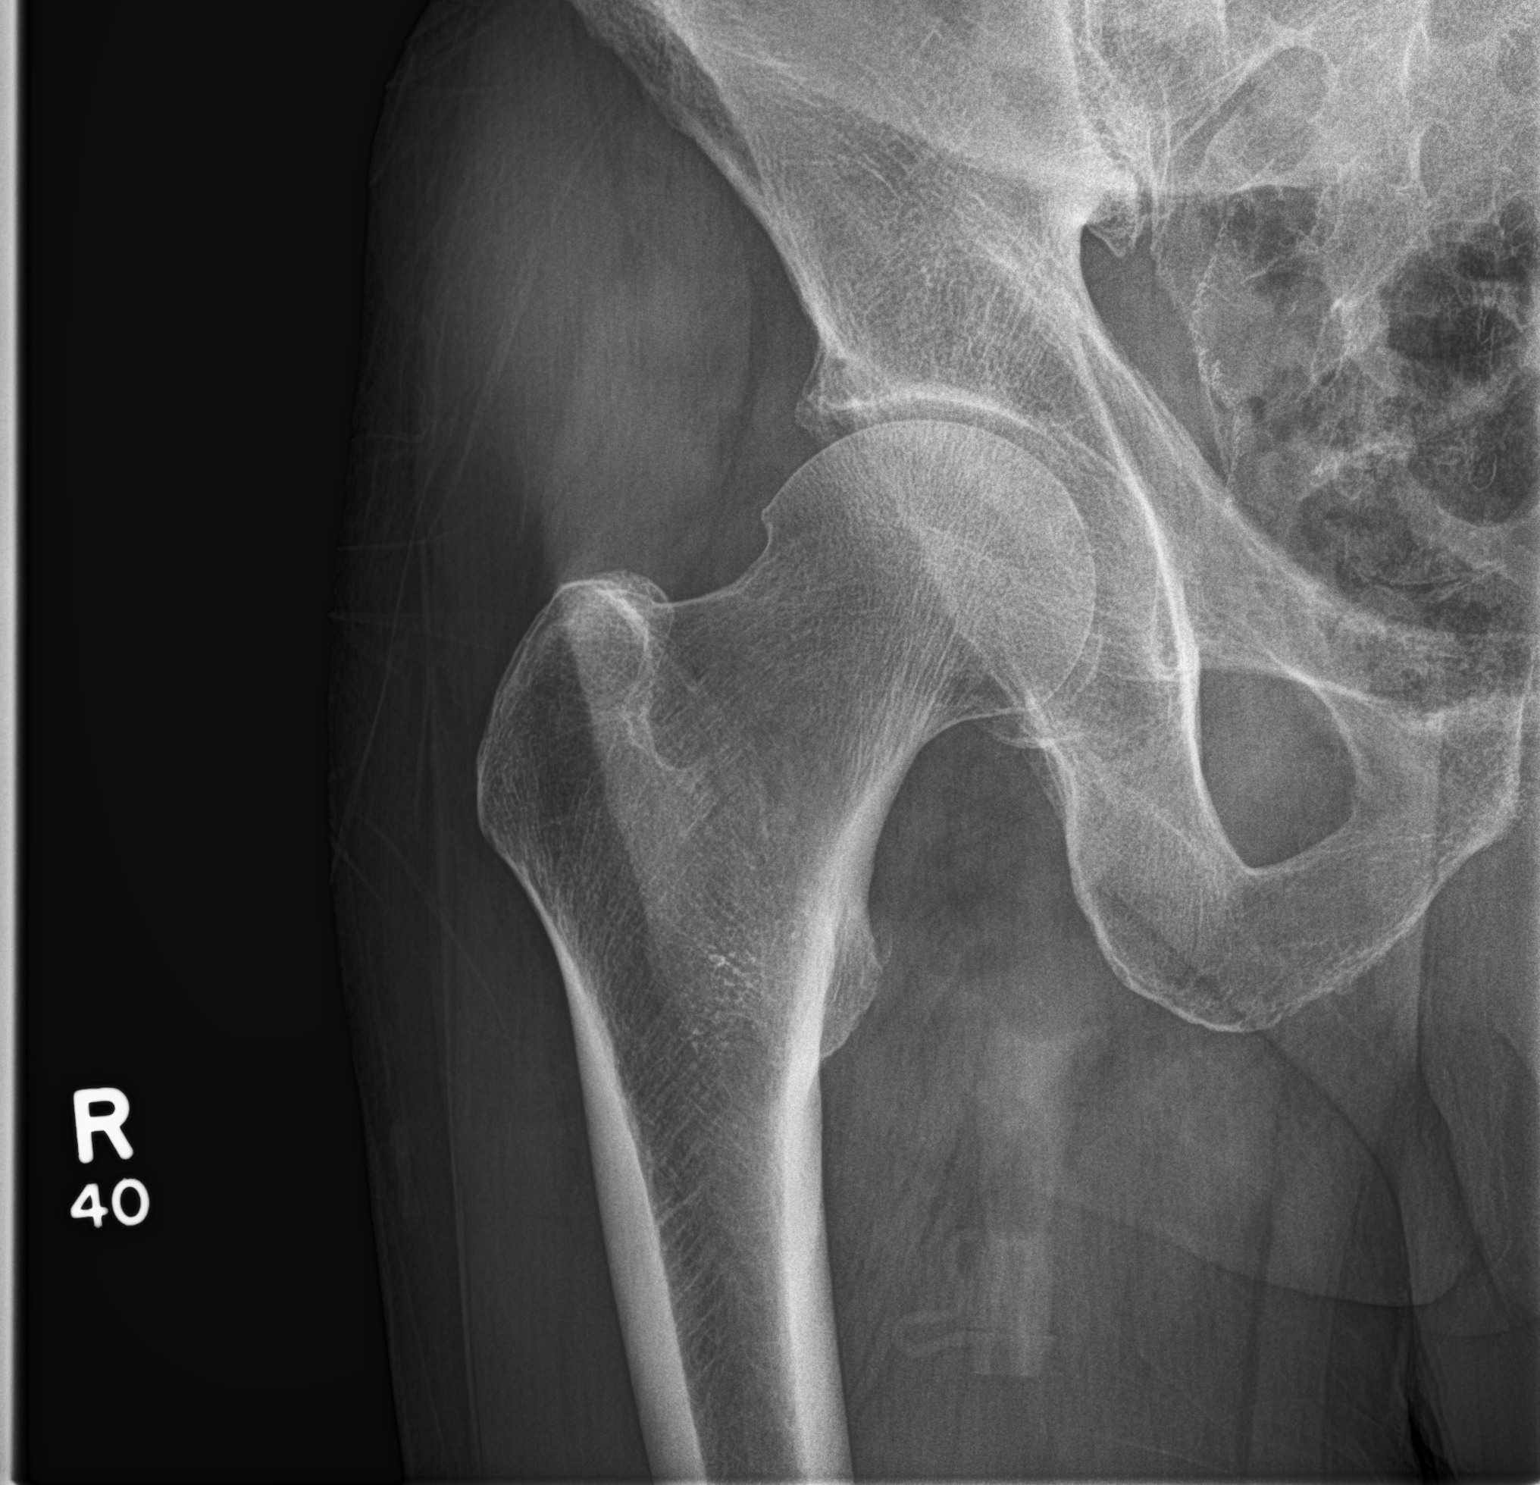
[im 3/3]
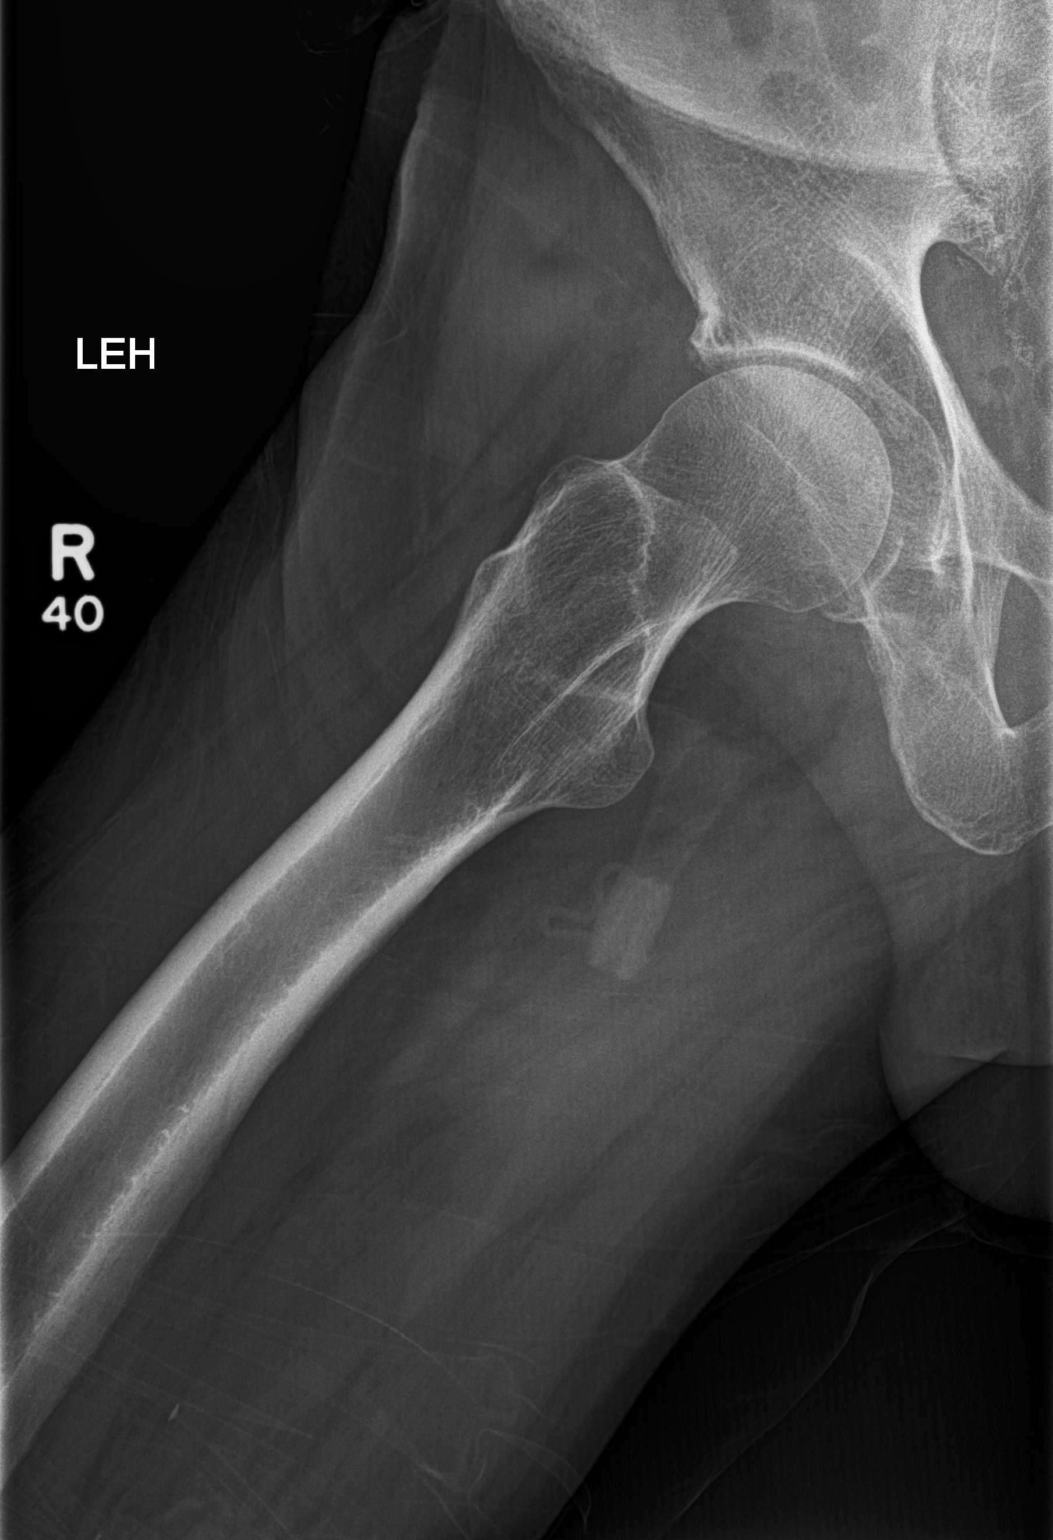

[3 of 3 positions shown; findings below may reference images not displayed]

FINDINGS: There is no evidence of hip fracture or dislocation. There is no
evidence of arthropathy or other focal bone abnormality. No
trochanteric region soft tissue abnormality is seen.
IMPRESSION: Normal radiographs.

## 2019-06-05 IMAGING — CR DG KNEE COMPLETE 4+V*R*
1 series · 4 of 4 positions shown · non-contrast
Comparison: None.

CLINICAL DATA: Chronic right hip and knee pain

EXAM:
RIGHT KNEE - COMPLETE 4+ VIEW

[Series 1: dg knee complete 4 views right · 0.14mm/px · 4 of 4 slices shown]
[im 1/4]
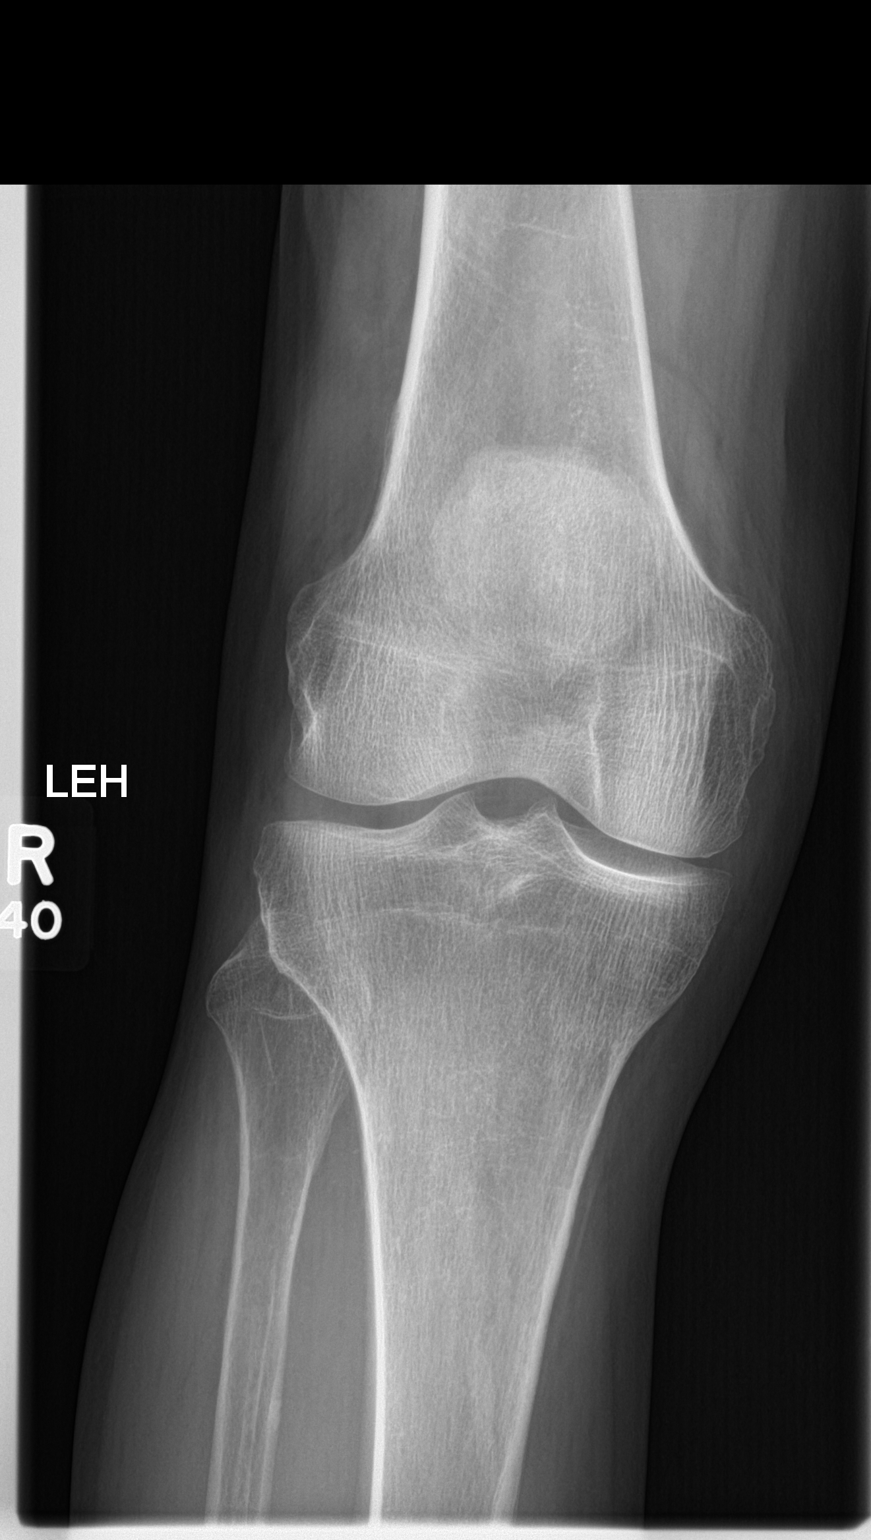
[im 2/4]
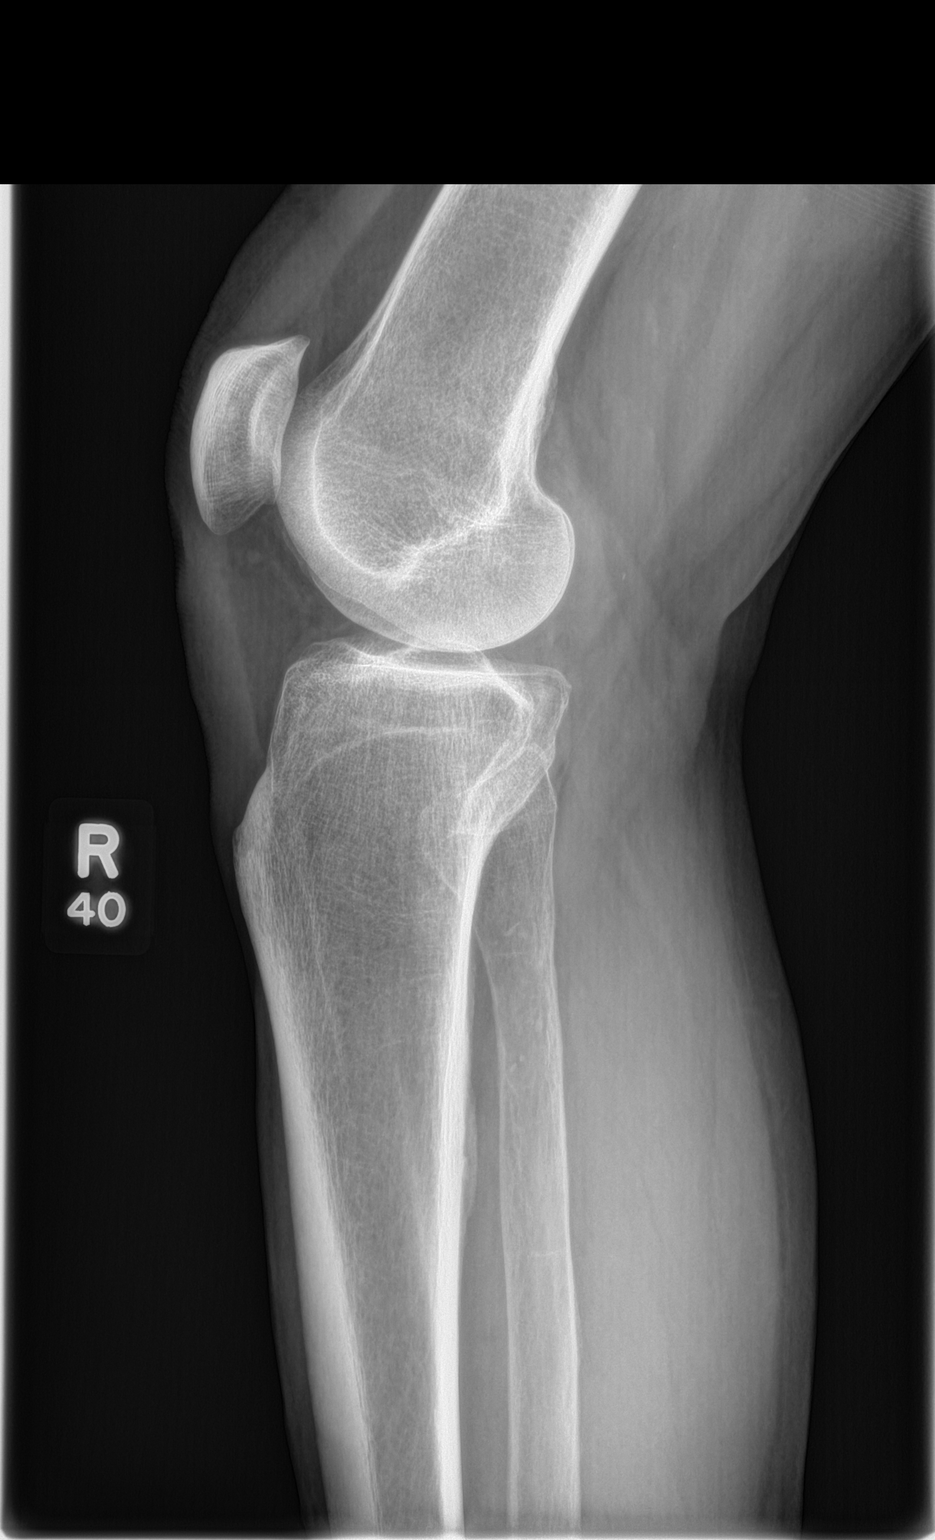
[im 3/4]
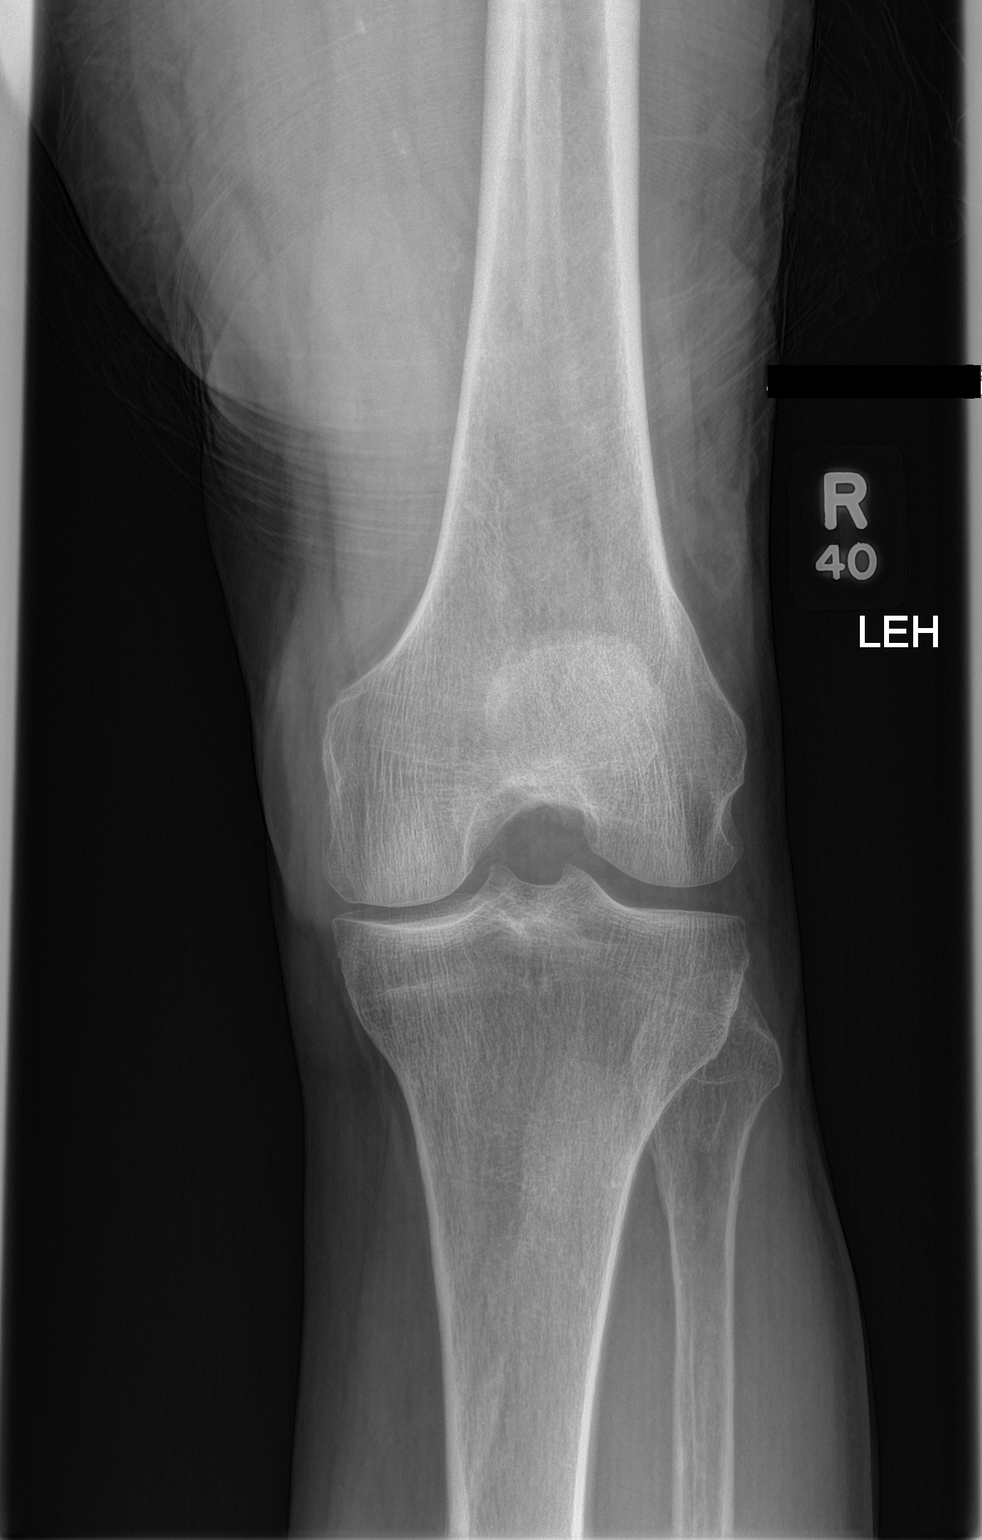
[im 4/4]
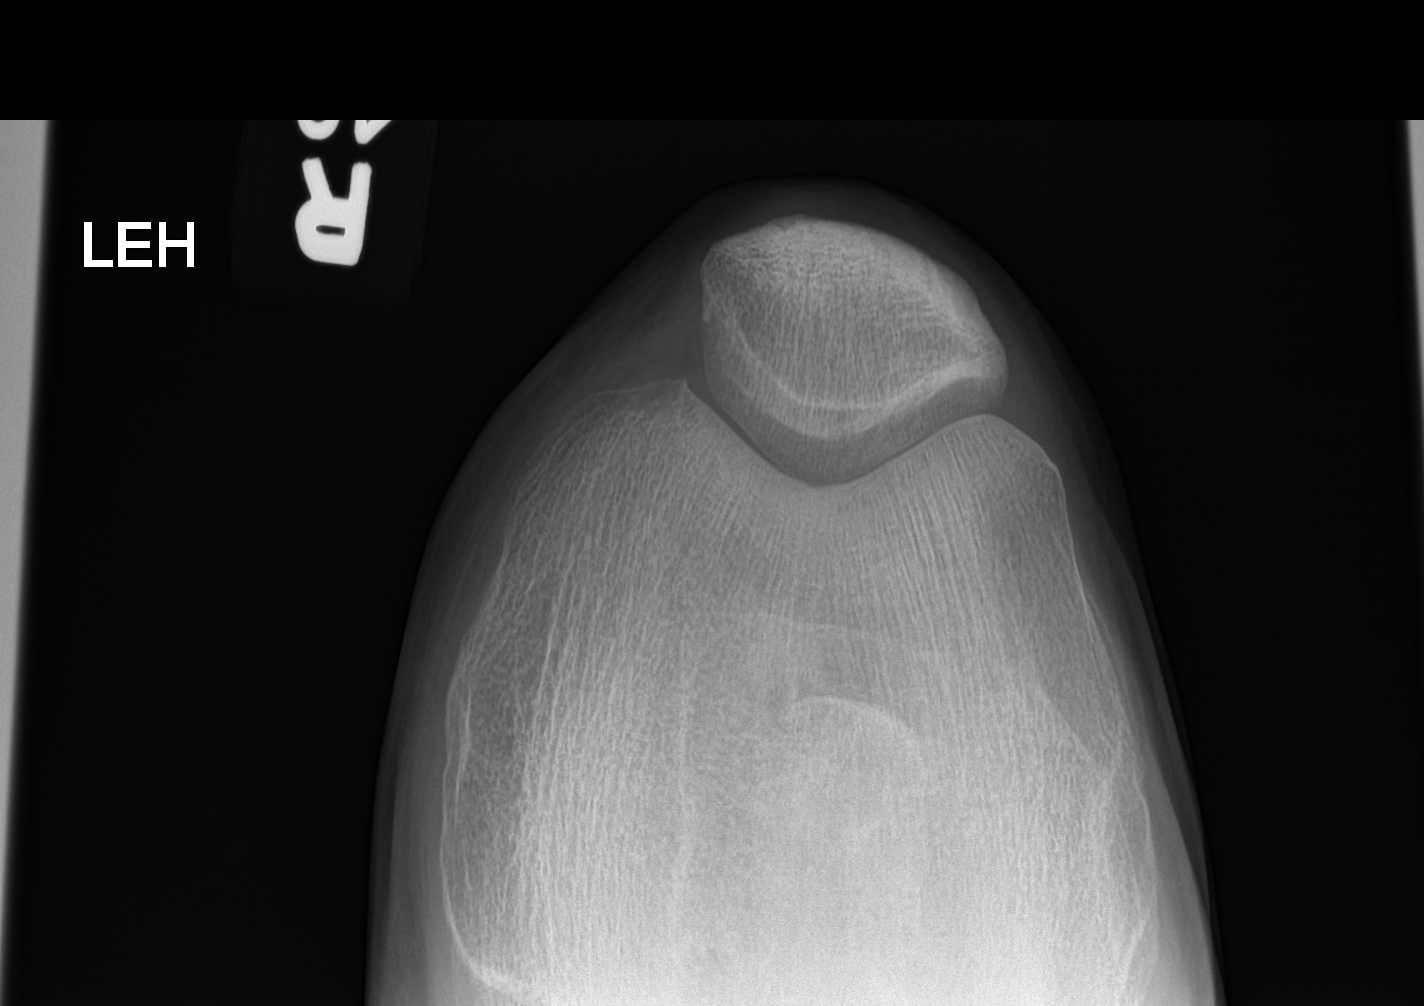

[4 of 4 positions shown; findings below may reference images not displayed]

FINDINGS: No evidence of fracture, dislocation, or joint effusion. No joint
space narrowing. No evidence of arthropathy or other focal bone
abnormality. Soft tissues are unremarkable.
IMPRESSION: Normal radiographs

## 2019-06-07 ENCOUNTER — Other Ambulatory Visit: Payer: Self-pay | Admitting: Family Medicine

## 2019-06-07 DIAGNOSIS — I10 Essential (primary) hypertension: Secondary | ICD-10-CM

## 2019-06-07 DIAGNOSIS — E7800 Pure hypercholesterolemia, unspecified: Secondary | ICD-10-CM

## 2019-06-07 DIAGNOSIS — E785 Hyperlipidemia, unspecified: Secondary | ICD-10-CM

## 2019-07-14 DIAGNOSIS — I2510 Atherosclerotic heart disease of native coronary artery without angina pectoris: Secondary | ICD-10-CM | POA: Diagnosis not present

## 2019-07-14 DIAGNOSIS — K589 Irritable bowel syndrome without diarrhea: Secondary | ICD-10-CM | POA: Diagnosis not present

## 2019-07-14 DIAGNOSIS — C672 Malignant neoplasm of lateral wall of bladder: Secondary | ICD-10-CM | POA: Diagnosis not present

## 2019-07-14 DIAGNOSIS — L299 Pruritus, unspecified: Secondary | ICD-10-CM | POA: Diagnosis not present

## 2019-07-14 DIAGNOSIS — Z8589 Personal history of malignant neoplasm of other organs and systems: Secondary | ICD-10-CM | POA: Diagnosis not present

## 2019-07-14 DIAGNOSIS — Z8551 Personal history of malignant neoplasm of bladder: Secondary | ICD-10-CM | POA: Diagnosis not present

## 2019-07-14 DIAGNOSIS — Z79899 Other long term (current) drug therapy: Secondary | ICD-10-CM | POA: Diagnosis not present

## 2019-07-14 DIAGNOSIS — K580 Irritable bowel syndrome with diarrhea: Secondary | ICD-10-CM | POA: Diagnosis not present

## 2019-07-14 DIAGNOSIS — Z951 Presence of aortocoronary bypass graft: Secondary | ICD-10-CM | POA: Diagnosis not present

## 2019-07-14 DIAGNOSIS — Z87442 Personal history of urinary calculi: Secondary | ICD-10-CM | POA: Diagnosis not present

## 2019-07-14 DIAGNOSIS — Z87891 Personal history of nicotine dependence: Secondary | ICD-10-CM | POA: Diagnosis not present

## 2019-07-14 DIAGNOSIS — Z886 Allergy status to analgesic agent status: Secondary | ICD-10-CM | POA: Diagnosis not present

## 2019-07-14 DIAGNOSIS — I10 Essential (primary) hypertension: Secondary | ICD-10-CM | POA: Diagnosis not present

## 2019-07-14 DIAGNOSIS — N200 Calculus of kidney: Secondary | ICD-10-CM | POA: Diagnosis not present

## 2019-07-28 ENCOUNTER — Ambulatory Visit: Payer: Self-pay | Admitting: Physician Assistant

## 2019-08-26 ENCOUNTER — Ambulatory Visit: Payer: Medicare HMO

## 2019-08-26 ENCOUNTER — Encounter: Payer: Self-pay | Admitting: Family Medicine

## 2019-08-26 ENCOUNTER — Ambulatory Visit (INDEPENDENT_AMBULATORY_CARE_PROVIDER_SITE_OTHER): Payer: Medicare HMO | Admitting: Family Medicine

## 2019-08-26 ENCOUNTER — Other Ambulatory Visit: Payer: Self-pay

## 2019-08-26 VITALS — BP 134/70 | HR 74 | Temp 98.6°F | Resp 16 | Ht 67.0 in | Wt 142.0 lb

## 2019-08-26 DIAGNOSIS — I10 Essential (primary) hypertension: Secondary | ICD-10-CM | POA: Diagnosis not present

## 2019-08-26 DIAGNOSIS — C672 Malignant neoplasm of lateral wall of bladder: Secondary | ICD-10-CM | POA: Diagnosis not present

## 2019-08-26 DIAGNOSIS — E7800 Pure hypercholesterolemia, unspecified: Secondary | ICD-10-CM | POA: Diagnosis not present

## 2019-08-26 DIAGNOSIS — L409 Psoriasis, unspecified: Secondary | ICD-10-CM | POA: Diagnosis not present

## 2019-08-26 DIAGNOSIS — Z936 Other artificial openings of urinary tract status: Secondary | ICD-10-CM

## 2019-08-26 NOTE — Progress Notes (Signed)
Patient: Terry [name], Male    DOB: Apr 23, 1936, 83 y.o.   MRN: [id number] Visit Date: 08/26/2019  Today's Provider: [name], PA   Chief Complaint  Patient presents with  . Annual Exam   Subjective:     Annual wellness visit Terry [name] is a 83 y.o. male. He feels well. He reports exercising not regularly, but he does stay active. He reports he is sleeping well.   Review of Systems  All other systems reviewed and are negative.  Social History   Socioeconomic History  . Marital status: Widowed    Spouse name: Not on file  . Number of children: 3  . Years of education: Not on file  . Highest education level: High school graduate  Occupational History  . Occupation: retired  Scientific laboratory technician  . Financial resource strain: Not hard at all  . Food insecurity    Worry: Never true    Inability: Never true  . Transportation needs    Medical: No    Non-medical: No  Tobacco Use  . Smoking status: Former Smoker    Packs/day: 1.50    Years: 50.00    Pack years: 75.00    Types: Cigarettes    Quit date: 06/18/2005    Years since quitting: 14.1  . Smokeless tobacco: Former Systems developer    Types: Chew  Substance and Sexual Activity  . Alcohol use: No    Alcohol/week: 0.0 standard drinks  . Drug use: No  . Sexual activity: Not on file  Lifestyle  . Physical activity    Days per week: Not on file    Minutes per session: Not on file  . Stress: Not at all  Relationships  . Social Herbalist on phone: Not on file    Gets together: Not on file    Attends religious service: Not on file    Active member of club or organization: Not on file    Attends meetings of clubs or organizations: Not on file    Relationship status: Not on file  . Intimate partner violence    Fear of current or ex partner: Not on file    Emotionally abused: Not on file    Physically abused: Not on file    Forced sexual activity: Not on file  Other Topics Concern  . Not  on file  Social History Narrative  . Not on file   Past Medical History:  Diagnosis Date  . Cancer [facility])    bladder cancer  . Hyperlipidemia   . Hypertension   . Kidney stones   . Status post surgical removal and fulguration of bladder neoplasm     Patient Active Problem List   Diagnosis Date Noted  . S/P ileal conduit ([place]) 08/07/2015  . History of surgical procedure 08/07/2015  . Bladder cancer ([place]) 06/21/2015  . Cancer of lateral wall of urinary bladder ([place]) 04/26/2015  . Malignant neoplasm of lateral wall of urinary bladder ([employer]) 04/26/2015  . Arteriosclerosis of coronary artery 04/24/2015  . Blood pressure elevated 04/24/2015  . HLD (hyperlipidemia) 04/24/2015  . Calculus of kidney 04/24/2015  . Psoriasis 04/24/2015  . Gastric ulcer 04/24/2015  . Pain in soft tissues of limb 04/24/2015  . Abnormal LFTs 04/24/2015  . Benign essential HTN 03/27/2015  . Combined fat and carbohydrate induced hyperlipemia 03/27/2015  . Breathlessness on exertion 03/27/2015   Past Surgical History:  Procedure Laterality Date  . BLADDER SURGERY  06/2015  . deviated nose septum surgery  1970  . EP IMPLANTABLE DEVICE    . HERNIA REPAIR  1980  . single septum port    . stomach ulcer surgery  1994  . vascular stent  2006   His family history includes Alzheimer's disease in his father; Diabetes in his mother; Heart disease in his mother; Hip fracture in his father; Hypertension in his father; Prostate cancer in his paternal uncle.  Current Outpatient Medications:  .  acetaminophen (TYLENOL) 325 MG tablet, Take 650 mg by mouth every 6 (six) hours as needed., Disp: , Rfl:  .  aspirin 81 MG tablet, Take 1 tablet by mouth daily., Disp: , Rfl:  .  cetirizine (ZYRTEC) 10 MG tablet, Take 10 mg by mouth daily., Disp: , Rfl:  .  diphenhydramine-acetaminophen (TYLENOL PM) 25-500 MG TABS tablet, Take 1 tablet by mouth at bedtime as needed., Disp: , Rfl:  .  Multiple Vitamin (MULTI-VITAMINS) TABS, Take  by mouth., Disp: , Rfl:  .  neomycin-bacitracin-polymyxin (NEOSPORIN) [phone] ointment, Apply 1 application topically as needed., Disp: , Rfl:  .  pravastatin (PRAVACHOL) 20 MG tablet, TAKE 1 TABLET (20 MG TOTAL) BY MOUTH DAILY., Disp: 90 tablet, Rfl: 3 .  triamcinolone cream (KENALOG) 0.1 %, Apply 1 application topically 2 (two) times daily. , Disp: , Rfl:   Patient Care Team: Aliya Sol, Vickki Muff, PA as PCP - General (Physician Assistant) Sidney Ace, NP as Nurse Practitioner (Surgical Oncology) Viprakasit, Marlowe Shores, MD as Referring Physician (Urology)    Objective:    Vitals: BP 134/70   Pulse 74 Comment: irregular  Temp 98.6 F (37 C)   Resp 16   Ht 5\' 7"  (1.702 m)   Wt 142 lb (64.4 kg)   SpO2 96%   BMI 22.24 kg/m   Physical Exam Constitutional:      General: He is not in acute distress.    Appearance: He is well-developed.  HENT:     Head: Normocephalic and atraumatic.     Right Ear: Hearing and tympanic membrane normal.     Left Ear: Hearing and tympanic membrane normal.     Nose: Nose normal.  Eyes:     General: Lids are normal. No scleral icterus.       Right eye: No discharge.        Left eye: No discharge.     Conjunctiva/sclera: Conjunctivae normal.  Neck:     Musculoskeletal: Normal range of motion and neck supple.  Cardiovascular:     Rate and Rhythm: Normal rate and regular rhythm.     Pulses: Normal pulses.     Heart sounds: Normal heart sounds.  Pulmonary:     Effort: Pulmonary effort is normal. No respiratory distress.  Abdominal:     General: Bowel sounds are normal.     Palpations: Abdomen is soft.     Comments: Ileal conduit stoma in the right lower quadrant with some irritation. No masses palpable.  Musculoskeletal: Normal range of motion.  Skin:    Findings: No lesion or rash.  Neurological:     Mental Status: He is alert and oriented to person, place, and time.  Psychiatric:        Speech: Speech normal.        Behavior: Behavior  normal.        Thought Content: Thought content normal.     Activities of Daily Living In your present state of health, do you have any difficulty performing the  following activities: 08/26/2019  Hearing? Y  Vision? Y  Difficulty concentrating or making decisions? N  Walking or climbing stairs? N  Dressing or bathing? N  Doing errands, shopping? N  Some recent data might be hidden   Fall Risk Assessment Fall Risk  08/26/2019 08/20/2018 07/23/2017 07/03/2016 06/21/2015  Falls in the past year? 0 No No No No  Number falls in past yr: 0 - - - -  Injury with Fall? 0 - - - -  Follow up Falls evaluation completed - - - -   Depression Screen PHQ 2/9 Scores 08/26/2019 08/20/2018 08/20/2018 07/23/2017  PHQ - 2 Score 0 0 0 0  PHQ- 9 Score 1 1 - 2     Assessment & Plan:     Annual Wellness Visit  Reviewed patient's Family Medical History Reviewed and updated list of patient's medical providers Assessment of cognitive impairment was done Assessed patient's functional ability Established a written schedule for health screening [place] Completed and Reviewed  Exercise Activities and Dietary recommendations Goals    . decrease soda intake     Recommend decreasing amount of soda intake. Pt to cut out 1 soda a day.     Marland Kitchen DIET - EAT MORE FRUITS AND VEGETABLES     Recommend to eat 1-2 servings of fruit a day.        Immunization History  Administered Date(s) Administered  . Pneumococcal Polysaccharide-23 08/15/2015, 09/17/2017  . Tdap 02/01/2013   Health Maintenance  Topic Date Due  . PNA vac Low Risk Adult (2 of 2 - PCV13) 09/17/2018  . TETANUS/TDAP  02/01/2023  . INFLUENZA VACCINE  Discontinued   1. Benign essential HTN Well controlled BP without additional antihypertensive meds. Still restricting salt intake. Recheck routine labs. - CBC with Differential/Platelet - Comprehensive metabolic panel - TSH  2. Pure hypercholesterolemia Continues to walk for  exercise and controlling fats in diet. Tolerating the Pravastatin 20 mg qd without side effects. Recheck CMP and Lipid Panel. - Comprehensive metabolic panel - Lipid panel  3. Cancer of lateral wall of urinary bladder (HCC)            History of pT3aN0 bladder cancer, RC on 07/03/15 w/o              neoadjuvant or adjuvant therapy initially. In October                   2017 had a CT with reported POD and subsequent LN             biopsy positive for recurrence. Started on               Pembrolizumab, as patient was platinum ineligible.             His scans had been stable until CT in June 2018 when             he had an isolated interval growth of right iliac LN.            He underwent XRT to LN with good response and                    restarted Pembrolizumab on 07/09/2017.            Follow up at [place] on 11/24/18 and              treatment switched to surveillance from  Pembrolizumab.   4. S/P ileal conduit (HCC) Some irritation around stoma that will be evaluated by [facility] Urology clinic 1:00 pm 08-29-19.    5. Psoriasis Some rough peeling rash on both wrists (dorsum) and crumbling finger nails. No rash on elbows or knees at the present. May use equal parts of Eucerin and Triamcinolone cream on rash at bedtime prn.   Discussed health benefits of physical activity, and encouraged him to engage in regular exercise appropriate for his age and condition.    [name], PA  [facility]

## 2019-08-27 LAB — COMPREHENSIVE METABOLIC PANEL
ALT: 18 IU/L (ref 0–44)
AST: 21 IU/L (ref 0–40)
Albumin/Globulin Ratio: 1.6 (ref 1.2–2.2)
Albumin: 4.3 g/dL (ref 3.6–4.6)
Alkaline Phosphatase: 91 IU/L (ref 39–117)
BUN/Creatinine Ratio: 16 (ref 10–24)
BUN: 17 mg/dL (ref 8–27)
Bilirubin Total: 0.3 mg/dL (ref 0.0–1.2)
CO2: 25 mmol/L (ref 20–29)
Calcium: 8.5 mg/dL — ABNORMAL LOW (ref 8.6–10.2)
Chloride: 105 mmol/L (ref 96–106)
Creatinine, Ser: 1.08 mg/dL (ref 0.76–1.27)
GFR calc Af Amer: 74 mL/min/{1.73_m2} (ref >59)
GFR calc non Af Amer: 64 mL/min/{1.73_m2} (ref >59)
Globulin, Total: 2.7 g/dL (ref 1.5–4.5)
Glucose: 99 mg/dL (ref 65–99)
Potassium: 4.8 mmol/L (ref 3.5–5.2)
Sodium: 139 mmol/L (ref 134–144)
Total Protein: 7 g/dL (ref 6.0–8.5)

## 2019-08-27 LAB — CBC WITH DIFFERENTIAL/PLATELET
Basophils Absolute: 0.1 10*3/uL (ref 0.0–0.2)
Basos: 1 %
EOS (ABSOLUTE): 0.1 10*3/uL (ref 0.0–0.4)
Eos: 1 %
Hematocrit: 43.6 % (ref 37.5–51.0)
Hemoglobin: 14.1 g/dL (ref 13.0–17.7)
Immature Grans (Abs): 0.1 10*3/uL (ref 0.0–0.1)
Immature Granulocytes: 1 %
Lymphocytes Absolute: 1.2 10*3/uL (ref 0.7–3.1)
Lymphs: 11 %
MCH: 27.3 pg (ref 26.6–33.0)
MCHC: 32.3 g/dL (ref 31.5–35.7)
MCV: 84 fL (ref 79–97)
Monocytes Absolute: 0.8 10*3/uL (ref 0.1–0.9)
Monocytes: 7 %
Neutrophils Absolute: 8.5 10*3/uL — ABNORMAL HIGH (ref 1.4–7.0)
Neutrophils: 79 %
Platelets: 273 10*3/uL (ref 150–450)
RBC: 5.17 x10E6/uL (ref 4.14–5.80)
RDW: 13.8 % (ref 11.6–15.4)
WBC: 10.8 10*3/uL (ref 3.4–10.8)

## 2019-08-27 LAB — LIPID PANEL
Chol/HDL Ratio: 2.3 ratio (ref 0.0–5.0)
Cholesterol, Total: 146 mg/dL (ref 100–199)
HDL: 63 mg/dL (ref >39)
LDL Chol Calc (NIH): 74 mg/dL (ref 0–99)
Triglycerides: 37 mg/dL (ref 0–149)
VLDL Cholesterol Cal: 9 mg/dL (ref 5–40)

## 2019-08-27 LAB — TSH: TSH: 2.51 u[IU]/mL (ref 0.450–4.500)

## 2019-08-29 ENCOUNTER — Telehealth: Payer: Self-pay

## 2019-08-29 DIAGNOSIS — C672 Malignant neoplasm of lateral wall of bladder: Secondary | ICD-10-CM | POA: Diagnosis not present

## 2019-08-29 DIAGNOSIS — N99528 Other complication of other external stoma of urinary tract: Secondary | ICD-10-CM | POA: Diagnosis not present

## 2019-08-29 NOTE — Telephone Encounter (Signed)
-----   Message from Margo Common, [place] sent at 08/29/2019  8:17 AM EDT ----- Blood tests essentially normal. Recheck annually or as needed.

## 2019-08-29 NOTE — Telephone Encounter (Signed)
LMTCB

## 2019-08-31 NOTE — Telephone Encounter (Signed)
Patient has been advised. KW 

## 2019-08-31 NOTE — Telephone Encounter (Signed)
LMTCB

## 2019-10-10 DIAGNOSIS — L720 Epidermal cyst: Secondary | ICD-10-CM | POA: Diagnosis not present

## 2019-10-10 DIAGNOSIS — L928 Other granulomatous disorders of the skin and subcutaneous tissue: Secondary | ICD-10-CM | POA: Diagnosis not present

## 2019-10-10 DIAGNOSIS — L988 Other specified disorders of the skin and subcutaneous tissue: Secondary | ICD-10-CM | POA: Diagnosis not present

## 2019-10-22 DIAGNOSIS — Z936 Other artificial openings of urinary tract status: Secondary | ICD-10-CM | POA: Diagnosis not present

## 2019-10-22 DIAGNOSIS — C7911 Secondary malignant neoplasm of bladder: Secondary | ICD-10-CM | POA: Diagnosis not present

## 2019-10-24 DIAGNOSIS — L120 Bullous pemphigoid: Secondary | ICD-10-CM | POA: Diagnosis not present

## 2019-11-28 DIAGNOSIS — L120 Bullous pemphigoid: Secondary | ICD-10-CM | POA: Diagnosis not present

## 2019-11-28 DIAGNOSIS — Z79899 Other long term (current) drug therapy: Secondary | ICD-10-CM | POA: Diagnosis not present

## 2019-11-29 DIAGNOSIS — Z79899 Other long term (current) drug therapy: Secondary | ICD-10-CM | POA: Diagnosis not present

## 2019-12-15 DIAGNOSIS — Z79899 Other long term (current) drug therapy: Secondary | ICD-10-CM | POA: Diagnosis not present

## 2019-12-22 DIAGNOSIS — R9341 Abnormal radiologic findings on diagnostic imaging of renal pelvis, ureter, or bladder: Secondary | ICD-10-CM | POA: Diagnosis not present

## 2019-12-22 DIAGNOSIS — C672 Malignant neoplasm of lateral wall of bladder: Secondary | ICD-10-CM | POA: Diagnosis not present

## 2019-12-22 DIAGNOSIS — Z9079 Acquired absence of other genital organ(s): Secondary | ICD-10-CM | POA: Diagnosis not present

## 2019-12-22 DIAGNOSIS — Z87442 Personal history of urinary calculi: Secondary | ICD-10-CM | POA: Diagnosis not present

## 2019-12-22 DIAGNOSIS — Z9359 Other cystostomy status: Secondary | ICD-10-CM | POA: Diagnosis not present

## 2019-12-22 DIAGNOSIS — N2889 Other specified disorders of kidney and ureter: Secondary | ICD-10-CM | POA: Diagnosis not present

## 2019-12-22 DIAGNOSIS — N202 Calculus of kidney with calculus of ureter: Secondary | ICD-10-CM | POA: Diagnosis not present

## 2019-12-22 DIAGNOSIS — R9389 Abnormal findings on diagnostic imaging of other specified body structures: Secondary | ICD-10-CM | POA: Diagnosis not present

## 2019-12-22 DIAGNOSIS — J439 Emphysema, unspecified: Secondary | ICD-10-CM | POA: Diagnosis not present

## 2019-12-22 DIAGNOSIS — N200 Calculus of kidney: Secondary | ICD-10-CM | POA: Diagnosis not present

## 2019-12-26 DIAGNOSIS — C672 Malignant neoplasm of lateral wall of bladder: Secondary | ICD-10-CM | POA: Diagnosis not present

## 2020-01-09 DIAGNOSIS — L120 Bullous pemphigoid: Secondary | ICD-10-CM | POA: Diagnosis not present

## 2020-01-09 DIAGNOSIS — Z79899 Other long term (current) drug therapy: Secondary | ICD-10-CM | POA: Diagnosis not present

## 2020-01-11 DIAGNOSIS — Z79899 Other long term (current) drug therapy: Secondary | ICD-10-CM | POA: Diagnosis not present

## 2020-01-19 DIAGNOSIS — Z936 Other artificial openings of urinary tract status: Secondary | ICD-10-CM | POA: Diagnosis not present

## 2020-01-19 DIAGNOSIS — C7911 Secondary malignant neoplasm of bladder: Secondary | ICD-10-CM | POA: Diagnosis not present

## 2020-01-25 ENCOUNTER — Telehealth: Payer: Self-pay

## 2020-01-25 NOTE — Telephone Encounter (Signed)
LMTCB to inquire about a telephonic AWV. Last AWV was 08/20/18.

## 2020-01-26 NOTE — Telephone Encounter (Signed)
Pt LM requesting a CB @ [phone].

## 2020-01-26 NOTE — Telephone Encounter (Signed)
Scheduled telephonic AWV for 02/01/20 @ 2:00 PM.

## 2020-01-31 DIAGNOSIS — E785 Hyperlipidemia, unspecified: Secondary | ICD-10-CM | POA: Diagnosis not present

## 2020-01-31 DIAGNOSIS — N200 Calculus of kidney: Secondary | ICD-10-CM | POA: Diagnosis not present

## 2020-01-31 DIAGNOSIS — I10 Essential (primary) hypertension: Secondary | ICD-10-CM | POA: Diagnosis not present

## 2020-01-31 DIAGNOSIS — Z955 Presence of coronary angioplasty implant and graft: Secondary | ICD-10-CM | POA: Diagnosis not present

## 2020-01-31 DIAGNOSIS — I2510 Atherosclerotic heart disease of native coronary artery without angina pectoris: Secondary | ICD-10-CM | POA: Diagnosis not present

## 2020-01-31 DIAGNOSIS — Z87891 Personal history of nicotine dependence: Secondary | ICD-10-CM | POA: Diagnosis not present

## 2020-01-31 DIAGNOSIS — Z7982 Long term (current) use of aspirin: Secondary | ICD-10-CM | POA: Diagnosis not present

## 2020-01-31 NOTE — Progress Notes (Addendum)
Subjective:   Terry [name] is a 84 y.o. male who presents for Medicare Annual/Subsequent preventive examination.    This visit is being conducted through telemedicine due to the COVID-19 pandemic. This patient has given me verbal consent via doximity to conduct this visit, patient states they are participating from their home address. Some vital signs may be absent or patient reported.    Patient identification: identified by name, DOB, and current address  Review of Systems:  N/A  Cardiac Risk Factors include: advanced age (>68men, >16 women);dyslipidemia;male gender     Objective:    Vitals: There were no vitals taken for this visit.  There is no height or weight on file to calculate BMI. Unable to obtain vitals due to visit being conducted via telephonically.   Advanced Directives 02/01/2020 08/20/2018 07/23/2017 06/21/2015  Does Patient Have a Medical Advance Directive? Yes Yes Yes No  Type of Paramedic of [place];Living will Healthcare Power of [place] in Chart? No - copy requested No - copy requested No - copy requested -  Would patient like information on creating a medical advance directive? - - - No - patient declined information    Tobacco Social History   Tobacco Use  Smoking Status Former Smoker   Packs/day: 1.50   Years: 50.00   Pack years: 75.00   Types: Cigarettes   Quit date: 06/18/2005   Years since quitting: 14.6  Smokeless Tobacco Former User   Types: Loss adjuster, chartered     Counseling given: Not Answered   Clinical Intake:  Pre-visit preparation completed: Yes  Pain : 0-10 Pain Score: 6 (Had right kidney drained today.) Pain Type: Acute pain Pain Location: Back Pain Orientation: Right, Lower Pain Descriptors / Indicators: Aching Pain Frequency: Constant     Nutritional Risks: None Diabetes: No  How often do you need to have someone help you when you read  instructions, pamphlets, or other written materials from your doctor or pharmacy?: 1 - Never  Interpreter Needed?: No  Information entered by :: [facility], LPN  Past Medical History:  Diagnosis Date   Cancer ([place])    bladder cancer   Hyperlipidemia    Hypertension    Kidney stones    Kidney stones    Nephrolithiasis    Status post surgical removal and fulguration of bladder neoplasm    Past Surgical History:  Procedure Laterality Date   BLADDER SURGERY  06/2015   deviated nose septum surgery  1970   EP [place]   single septum port     stomach ulcer surgery  1994   vascular stent  2006   Family History  Problem Relation Age of Onset   Diabetes Mother    Heart disease Mother    Hypertension Father    Alzheimer's disease Father    Hip fracture Father    Prostate cancer Paternal Uncle    Social History   Socioeconomic History   Marital status: Widowed    Spouse name: Not on file   Number of children: 3   Years of education: Not on file   Highest education level: High school graduate  Occupational History   Occupation: retired  Tobacco Use   Smoking status: Former Smoker    Packs/day: 1.50    Years: 50.00    Pack years: 75.00    Types: Cigarettes    Quit date: 06/18/2005  Years since quitting: 14.6   Smokeless tobacco: Former Systems developer    Types: Chew  Substance and Sexual Activity   Alcohol use: No    Alcohol/week: 0.0 standard drinks   Drug use: No   Sexual activity: Not on file  Other Topics Concern   Not on file  Social History Narrative   Not on file   Social Determinants of Health   Financial Resource Strain: Low Risk    Difficulty of Paying Living Expenses: Not hard at all  Food Insecurity: No Food Insecurity   Worried About Charity fundraiser in the Last Year: Never true   [place] in the Last Year: Never true  Transportation Needs: No Transportation Needs   Lack of Transportation (Medical): No   Lack of  Transportation (Non-Medical): No  Physical Activity: Inactive   Days of Exercise per Week: 0 days   Minutes of Exercise per Session: 0 min  Stress: No Stress Concern Present   Feeling of Stress : Not at all  Social Connections: Slightly Isolated   Frequency of Communication with Friends and Family: More than three times a week   Frequency of Social Gatherings with Friends and Family: More than three times a week   Attends Religious Services: More than 4 times per year   Active Member of [employer] or Organizations: Yes   Attends Archivist Meetings: More than 4 times per year   Marital Status: Widowed    Outpatient Encounter Medications as of 02/01/2020  Medication Sig   acetaminophen (TYLENOL) 325 MG tablet Take 650 mg by mouth every 6 (six) hours as needed.   dapsone 100 MG tablet Take 150 mg by mouth daily.    neomycin-bacitracin-polymyxin (NEOSPORIN) [phone] ointment Apply 1 application topically as needed.   Ostomy Supplies (SENSURA DRAINABLE) Pouch MISC Ostomy supplies as needed. Diagnosis: Bladder Cancer (C67.9)   pravastatin (PRAVACHOL) 20 MG tablet TAKE 1 TABLET (20 MG TOTAL) BY MOUTH DAILY.   Skin Protectants, Misc. (CAVILON NO STING BARRIER FILM EX) Apply topically as needed.   triamcinolone cream (KENALOG) 0.1 % Apply 1 application topically 2 (two) times daily.    aspirin 81 MG tablet Take 1 tablet by mouth daily.    cetirizine (ZYRTEC) 10 MG tablet Take 10 mg by mouth daily.   diphenhydramine-acetaminophen (TYLENOL PM) 25-500 MG TABS tablet Take 1 tablet by mouth at bedtime as needed.   Multiple Vitamin (MULTI-VITAMINS) TABS Take by mouth.   No facility-administered encounter medications on file as of 02/01/2020.    Activities of Daily Living In your present state of health, do you have any difficulty performing the following activities: 02/01/2020 08/26/2019  Hearing? Y Y  Comment Does not wear hearing aids. -  Vision? N Y  Difficulty concentrating or making  decisions? N N  Walking or climbing stairs? N N  Dressing or bathing? N N  Doing errands, shopping? N N  Preparing Food and eating ? N -  Using the Toilet? N -  In the past six months, have you accidently leaked urine? N -  Do you have problems with loss of bowel control? N -  Managing your Medications? N -  Managing your Finances? N -  Housekeeping or managing your Housekeeping? N -  Some recent data might be hidden    Patient Care Team: Chrismon, Vickki Muff, PA as PCP - General (Physician Assistant) Sidney Ace, NP as Nurse Practitioner (Surgical Oncology) Viprakasit, Marlowe Shores, MD as Referring Physician (Urology)  Baxter Kail, MD (Internal Medicine) Idolina Primer Kristine Royal, NP as Nurse Practitioner (Nurse Practitioner)   Assessment:   This is a routine wellness examination for [place].  Exercise Activities and Dietary recommendations Current Exercise Habits: The patient does not participate in regular exercise at present, Exercise limited by: Other - see comments(current kidney stone issues)  Goals      DIET - EAT MORE FRUITS AND VEGETABLES     Recommend to eat 1-2 servings of fruit a day.         Fall Risk: Fall Risk  02/01/2020 08/26/2019 08/20/2018 07/23/2017 07/03/2016  Falls in the past year? 0 0 No No No  Number falls in past yr: 0 0 - - -  Injury with Fall? 0 0 - - -  Follow up - Falls evaluation completed - - -    FALL RISK PREVENTION PERTAINING TO THE HOME:  Any stairs in or around the home? Yes  If so, are there any without handrails? No   Home free of loose throw rugs in walkways, pet beds, electrical cords, etc? Yes  Adequate lighting in your home to reduce risk of falls? Yes   ASSISTIVE DEVICES UTILIZED TO PREVENT FALLS:  Life alert? No  Use of a cane, walker or w/c? No  Grab bars in the bathroom? No  Shower chair or bench in shower? No  Elevated toilet seat or a handicapped toilet? No   TIMED UP AND GO:  Was the test performed? No .    Depression  Screen PHQ 2/9 Scores 02/01/2020 08/26/2019 08/20/2018 08/20/2018  PHQ - 2 Score 1 0 0 0  PHQ- 9 Score - 1 1 -    Cognitive Function: Declined today.         Immunization History  Administered Date(s) Administered   Pneumococcal Polysaccharide-23 08/15/2015, 09/17/2017   Tdap 02/01/2013    Qualifies for Shingles Vaccine? Yes . Due for Shingrix. Pt has been advised to call insurance company to determine out of pocket expense. Advised may also receive vaccine at local pharmacy or Health Dept. Verbalized acceptance and understanding.  Tdap: Up to date  Pneumococcal Vaccine: Due for Pneumococcal vaccine. Does the patient want to receive this vaccine today?  No . Advised may receive this vaccine at local pharmacy or Health Dept. Aware to provide a copy of the vaccination record if obtained from local pharmacy or Health Dept. Verbalized acceptance and understanding.   Screening Tests Health Maintenance  Topic Date Due   PNA vac Low Risk Adult (2 of 2 - PCV13) 09/17/2018   TETANUS/TDAP  02/01/2023   INFLUENZA VACCINE  Discontinued   Cancer Screenings:  Colorectal Screening: No longer required.   Lung Cancer Screening: (Low Dose CT Chest recommended if Age 16-80 years, 30 pack-year currently smoking OR have quit w/in 15years.) does not qualify.   Additional Screening:  Vision Screening: Recommended annual ophthalmology exams for early detection of glaucoma and other disorders of the eye.  Dental Screening: Recommended annual dental exams for proper oral hygiene  Community Resource Referral:  CRR required this visit?  No        Plan:  I have personally reviewed and addressed the Medicare Annual Wellness questionnaire and have noted the following in the patient's chart:  A. Medical and social history B. Use of alcohol, tobacco or illicit drugs  C. Current medications and supplements D. Functional ability and status E.  Nutritional status F.  Physical activity G. Advance  directives H. List of other physicians I.  Hospitalizations, surgeries, and ER visits in previous 12 months J.  [place] such as hearing and vision if needed, cognitive and depression L. Referrals and appointments   In addition, I have reviewed and discussed with patient certain preventive protocols, quality metrics, and best practice recommendations. A written personalized care plan for preventive services as well as general preventive health recommendations were provided to patient.   [place]ora Score, [place]  [id number] Nurse Health Advisor   Nurse Notes: Pt to check with his physicians in [place] to see if he has had the Prevnar 13 vaccine and will update [place] accordingly.   Reviewed screening note by Nurse Health Advisor. Agree with documentation and plan.

## 2020-02-01 ENCOUNTER — Ambulatory Visit (INDEPENDENT_AMBULATORY_CARE_PROVIDER_SITE_OTHER): Payer: Medicare HMO

## 2020-02-01 ENCOUNTER — Other Ambulatory Visit: Payer: Self-pay

## 2020-02-01 DIAGNOSIS — Z0000 Encounter for general adult medical examination without abnormal findings: Secondary | ICD-10-CM | POA: Diagnosis not present

## 2020-02-01 NOTE — Patient Instructions (Signed)
Terry [name] , Thank you for taking time to come for your Medicare Wellness Visit. I appreciate your ongoing commitment to your health goals. Please review the following plan we discussed and let me know if I can assist you in the future.   Screening recommendations/referrals: Colonoscopy: No longer required.  Recommended yearly ophthalmology/optometry visit for glaucoma screening and checkup Recommended yearly dental visit for hygiene and checkup  Vaccinations: Pneumococcal vaccine: Completed series Tdap vaccine: Up to date, due 01/2023 Shingles vaccine: Pt declines today.     Advanced directives: Please bring a copy of your POA (Power of Attorney) and/or Living Will to your next appointment.   Conditions/risks identified: Recommend to eat 1-2 servings of fruit a day.   Next appointment: None. Declined scheduling a follow up with PCP or an AWV for 2022 at this time.   Preventive Care 16 Years and Older, Male Preventive care refers to lifestyle choices and visits with your health care provider that can promote health and wellness. What does preventive care include?  A yearly physical exam. This is also called an annual well check.  Dental exams once or twice a year.  Routine eye exams. Ask your health care provider how often you should have your eyes checked.  Personal lifestyle choices, including:  Daily care of your teeth and gums.  Regular physical activity.  Eating a healthy diet.  Avoiding tobacco and drug use.  Limiting alcohol use.  Practicing safe sex.  Taking low doses of aspirin every day.  Taking vitamin and mineral supplements as recommended by your health care provider. What happens during an annual well check? The services and screenings done by your health care provider during your annual well check will depend on your age, overall health, lifestyle risk factors, and family history of disease. Counseling  Your health care provider may ask you questions  about your:  Alcohol use.  Tobacco use.  Drug use.  Emotional well-being.  Home and relationship well-being.  Sexual activity.  Eating habits.  History of falls.  Memory and ability to understand (cognition).  Work and work Statistician. Screening  You may have the following tests or measurements:  Height, weight, and BMI.  Blood pressure.  Lipid and cholesterol levels. These may be checked every 5 years, or more frequently if you are over 30 years old.  Skin check.  Lung cancer screening. You may have this screening every year starting at age 31 if you have a 30-pack-year history of smoking and currently smoke or have quit within the past 15 years.  Fecal occult blood test (FOBT) of the stool. You may have this test every year starting at age 65.  Flexible sigmoidoscopy or colonoscopy. You may have a sigmoidoscopy every 5 years or a colonoscopy every 10 years starting at age 80.  Prostate cancer screening. Recommendations will vary depending on your family history and other risks.  Hepatitis C blood test.  Hepatitis B blood test.  Sexually transmitted disease (STD) testing.  Diabetes screening. This is done by checking your blood sugar (glucose) after you have not eaten for a while (fasting). You may have this done every 1-3 years.  Abdominal aortic aneurysm (AAA) screening. You may need this if you are a current or former smoker.  Osteoporosis. You may be screened starting at age 32 if you are at high risk. Talk with your health care provider about your test results, treatment options, and if necessary, the need for more tests. Vaccines  Your health care provider  may recommend certain vaccines, such as:  Influenza vaccine. This is recommended every year.  Tetanus, diphtheria, and acellular pertussis (Tdap, Td) vaccine. You may need a Td booster every 10 years.  Zoster vaccine. You may need this after age 65.  Pneumococcal 13-valent conjugate (PCV13)  vaccine. One dose is recommended after age 64.  Pneumococcal polysaccharide (PPSV23) vaccine. One dose is recommended after age 21. Talk to your health care provider about which screenings and vaccines you need and how often you need them. This information is not intended to replace advice given to you by your health care provider. Make sure you discuss any questions you have with your health care provider. Document Released: 12/28/2015 Document Revised: 08/20/2016 Document Reviewed: 10/02/2015 Elsevier Interactive Patient Education  2017 [place] Prevention in the Home Falls can cause injuries. They can happen to people of all ages. There are many things you can do to make your home safe and to help prevent falls. What can I do on the outside of my home?  Regularly fix the edges of walkways and driveways and fix any cracks.  Remove anything that might make you trip as you walk through a door, such as a raised step or threshold.  Trim any bushes or trees on the path to your home.  Use bright outdoor lighting.  Clear any walking paths of anything that might make someone trip, such as rocks or tools.  Regularly check to see if handrails are loose or broken. Make sure that both sides of any steps have handrails.  Any raised decks and porches should have guardrails on the edges.  Have any leaves, snow, or ice cleared regularly.  Use sand or salt on walking paths during winter.  Clean up any spills in your garage right away. This includes oil or grease spills. What can I do in the bathroom?  Use night lights.  Install grab bars by the toilet and in the tub and shower. Do not use towel bars as grab bars.  Use non-skid mats or decals in the tub or shower.  If you need to sit down in the shower, use a plastic, non-slip stool.  Keep the floor dry. Clean up any water that spills on the floor as soon as it happens.  Remove soap buildup in the tub or shower  regularly.  Attach bath mats securely with double-sided non-slip rug tape.  Do not have throw rugs and other things on the floor that can make you trip. What can I do in the bedroom?  Use night lights.  Make sure that you have a light by your bed that is easy to reach.  Do not use any sheets or blankets that are too big for your bed. They should not hang down onto the floor.  Have a firm chair that has side arms. You can use this for support while you get dressed.  Do not have throw rugs and other things on the floor that can make you trip. What can I do in the kitchen?  Clean up any spills right away.  Avoid walking on wet floors.  Keep items that you use a lot in easy-to-reach places.  If you need to reach something above you, use a strong step stool that has a grab bar.  Keep electrical cords out of the way.  Do not use floor polish or wax that makes floors slippery. If you must use wax, use non-skid floor wax.  Do not have throw rugs  and other things on the floor that can make you trip. What can I do with my stairs?  Do not leave any items on the stairs.  Make sure that there are handrails on both sides of the stairs and use them. Fix handrails that are broken or loose. Make sure that handrails are as long as the stairways.  Check any carpeting to make sure that it is firmly attached to the stairs. Fix any carpet that is loose or worn.  Avoid having throw rugs at the top or bottom of the stairs. If you do have throw rugs, attach them to the floor with carpet tape.  Make sure that you have a light switch at the top of the stairs and the bottom of the stairs. If you do not have them, ask someone to add them for you. What else can I do to help prevent falls?  Wear shoes that:  Do not have high heels.  Have rubber bottoms.  Are comfortable and fit you well.  Are closed at the toe. Do not wear sandals.  If you use a stepladder:  Make sure that it is fully  opened. Do not climb a closed stepladder.  Make sure that both sides of the stepladder are locked into place.  Ask someone to hold it for you, if possible.  Clearly mark and make sure that you can see:  Any grab bars or handrails.  First and last steps.  Where the edge of each step is.  Use tools that help you move around (mobility aids) if they are needed. These include:  Canes.  Walkers.  Scooters.  Crutches.  Turn on the lights when you go into a dark area. Replace any light bulbs as soon as they burn out.  Set up your furniture so you have a clear path. Avoid moving your furniture around.  If any of your floors are uneven, fix them.  If there are any pets around you, be aware of where they are.  Review your medicines with your doctor. Some medicines can make you feel dizzy. This can increase your chance of falling. Ask your doctor what other things that you can do to help prevent falls. This information is not intended to replace advice given to you by your health care provider. Make sure you discuss any questions you have with your health care provider. Document Released: 09/27/2009 Document Revised: 05/08/2016 Document Reviewed: 01/05/2015 Elsevier Interactive Patient Education  2017 [employer].

## 2020-02-03 ENCOUNTER — Telehealth: Payer: Self-pay | Admitting: Family Medicine

## 2020-02-03 NOTE — Telephone Encounter (Signed)
Try to reschedule pre-op visit sooner than 02-07-20.

## 2020-02-03 NOTE — Telephone Encounter (Signed)
Dr from [facility] called about the surgical clearance request she made /Pt is scheduled for surgery next week/ please advise asap

## 2020-02-03 NOTE — Telephone Encounter (Signed)
Please advise form. 

## 2020-02-03 NOTE — Telephone Encounter (Signed)
Patient stated he could only do an ov on 02/06/2020. scheduled appt.

## 2020-02-06 ENCOUNTER — Other Ambulatory Visit: Payer: Self-pay

## 2020-02-06 ENCOUNTER — Ambulatory Visit (INDEPENDENT_AMBULATORY_CARE_PROVIDER_SITE_OTHER): Payer: Medicare HMO | Admitting: Family Medicine

## 2020-02-06 ENCOUNTER — Encounter: Payer: Self-pay | Admitting: Family Medicine

## 2020-02-06 VITALS — BP 150/72 | HR 62 | Temp 97.3°F | Wt 144.0 lb

## 2020-02-06 DIAGNOSIS — E7800 Pure hypercholesterolemia, unspecified: Secondary | ICD-10-CM

## 2020-02-06 DIAGNOSIS — Z79899 Other long term (current) drug therapy: Secondary | ICD-10-CM | POA: Diagnosis not present

## 2020-02-06 DIAGNOSIS — C672 Malignant neoplasm of lateral wall of bladder: Secondary | ICD-10-CM | POA: Diagnosis not present

## 2020-02-06 DIAGNOSIS — I2510 Atherosclerotic heart disease of native coronary artery without angina pectoris: Secondary | ICD-10-CM | POA: Diagnosis not present

## 2020-02-06 DIAGNOSIS — N200 Calculus of kidney: Secondary | ICD-10-CM

## 2020-02-06 DIAGNOSIS — Z936 Other artificial openings of urinary tract status: Secondary | ICD-10-CM

## 2020-02-06 DIAGNOSIS — Z01818 Encounter for other preprocedural examination: Secondary | ICD-10-CM

## 2020-02-06 NOTE — Telephone Encounter (Signed)
Funbi calling from [facility] is calling to check on the status of the medical clearance form Cb- [phone] Fax [phone]

## 2020-02-06 NOTE — Progress Notes (Signed)
Terry [name] [facility]  MRN: [id number] DOB: [birth year]  Subjective:  HPI   The patient is an 84 year old male who presents today for pre-op exam.  He is scheduled to have kidney stone removal on 02/09/20 by Dr Aleene Davidson, Urology from [facility].  He is currently on antibiotics that he states he is to take until the procedure. Has had nephrolithiasis multiple times in the past few years since ileal conduit due to bladder   Patient Active Problem List   Diagnosis Date Noted  . S/P ileal conduit ([place]) 08/07/2015  . History of surgical procedure 08/07/2015  . Bladder cancer ([place]) 06/21/2015  . Cancer of lateral wall of urinary bladder ([place]) 04/26/2015  . Malignant neoplasm of lateral wall of urinary bladder ([place]) 04/26/2015  . Arteriosclerosis of coronary artery 04/24/2015  . Blood pressure elevated 04/24/2015  . HLD (hyperlipidemia) 04/24/2015  . Calculus of kidney 04/24/2015  . Psoriasis 04/24/2015  . Gastric ulcer 04/24/2015  . Pain in soft tissues of limb 04/24/2015  . Abnormal LFTs 04/24/2015  . Benign essential HTN 03/27/2015  . Combined fat and carbohydrate induced hyperlipemia 03/27/2015  . Breathlessness on exertion 03/27/2015    Past Medical History:  Diagnosis Date  . Cancer [facility])    bladder cancer  . Hyperlipidemia   . Hypertension   . Kidney stones   . Kidney stones   . Nephrolithiasis   . Status post surgical removal and fulguration of bladder neoplasm    Past Surgical History:  Procedure Laterality Date  . BLADDER SURGERY  06/2015  . deviated nose septum surgery  1970  . EP IMPLANTABLE DEVICE    . HERNIA REPAIR  1980  . single septum port    . stomach ulcer surgery  1994  . vascular stent  2006   Social History   Socioeconomic History  . Marital status: Widowed    Spouse name: Not on file  . Number of children: 3  . Years of education: Not on file  . Highest education level: High school graduate  Occupational History  . Occupation: retired  Tobacco  Use  . Smoking status: Former Smoker    Packs/day: 1.50    Years: 50.00    Pack years: 75.00    Types: Cigarettes    Quit date: 06/18/2005    Years since quitting: 14.6  . Smokeless tobacco: Former Systems developer    Types: Chew  Substance and Sexual Activity  . Alcohol use: No    Alcohol/week: 0.0 standard drinks  . Drug use: No  . Sexual activity: Not on file  Other Topics Concern  . Not on file  Social History Narrative  . Not on file   Social Determinants of Health   Financial Resource Strain: Low Risk   . Difficulty of Paying Living Expenses: Not hard at all  Food Insecurity: No Food Insecurity  . Worried About Charity fundraiser in the Last Year: Never true  . Ran Out of Food in the Last Year: Never true  Transportation Needs: No Transportation Needs  . Lack of Transportation (Medical): No  . Lack of Transportation (Non-Medical): No  Physical Activity: Inactive  . Days of Exercise per Week: 0 days  . Minutes of Exercise per Session: 0 min  Stress: No Stress Concern Present  . Feeling of Stress : Not at all  Social Connections: Slightly Isolated  . Frequency of Communication with Friends and Family: More than three times a week  . Frequency of Social Gatherings  with Friends and Family: More than three times a week  . Attends Religious Services: More than 4 times per year  . Active Member of Clubs or Organizations: Yes  . Attends Archivist Meetings: More than 4 times per year  . Marital Status: Widowed  Intimate Partner Violence: Not At Risk  . Fear of Current or Ex-Partner: No  . Emotionally Abused: No  . Physically Abused: No  . Sexually Abused: No    Outpatient Encounter Medications as of 02/06/2020  Medication Sig  . acetaminophen (TYLENOL) 325 MG tablet Take 650 mg by mouth every 6 (six) hours as needed.  . dapsone 100 MG tablet Take 150 mg by mouth daily.   Marland Kitchen neomycin-bacitracin-polymyxin (NEOSPORIN) [phone] ointment Apply 1 application topically as  needed.  Oneta Rack Supplies (SENSURA DRAINABLE) Pouch MISC Ostomy supplies as needed. Diagnosis: Bladder Cancer (C67.9)  . pravastatin (PRAVACHOL) 20 MG tablet TAKE 1 TABLET (20 MG TOTAL) BY MOUTH DAILY.  . Skin Protectants, Misc. (CAVILON NO STING BARRIER FILM EX) Apply topically as needed.  . triamcinolone cream (KENALOG) 0.1 % Apply 1 application topically 2 (two) times daily.   Marland Kitchen aspirin 81 MG tablet Take 1 tablet by mouth daily.   . cetirizine (ZYRTEC) 10 MG tablet Take 10 mg by mouth daily.  . diphenhydramine-acetaminophen (TYLENOL PM) 25-500 MG TABS tablet Take 1 tablet by mouth at bedtime as needed.  . Multiple Vitamin (MULTI-VITAMINS) TABS Take by mouth.   No facility-administered encounter medications on file as of 02/06/2020.    Allergies  Allergen Reactions  . Triprolidine-Pseudoephedrine Rash and Hives  . Sudafed Pe Cold-Cough  [Phenylephrine-Dm-Gg-Apap] Hives  . Actifed Cold-Allergy  [Chlorpheniramine-Phenylephrine] Rash    Review of Systems  Constitutional: Negative for chills, diaphoresis, fever and malaise/fatigue.  HENT: Negative for congestion, ear pain and sore throat.   Respiratory: Negative for cough and shortness of breath.   Cardiovascular: Negative for chest pain.  Gastrointestinal: Negative for abdominal pain and diarrhea.  Musculoskeletal: Negative for myalgias.  Neurological: Negative for dizziness and headaches.    Objective:  BP (!) 150/72 (BP Location: Right Arm, Patient Position: Sitting, Cuff Size: Normal)   Pulse 62   Temp (!) 97.3 F (36.3 C) (Skin)   Wt 144 lb (65.3 kg)   SpO2 97%   BMI 22.55 kg/m   Physical Exam  Constitutional: He is oriented to person, place, and time and well-developed, well-nourished, and in no distress.  HENT:  Head: Normocephalic.  Eyes: Conjunctivae are normal.  Cardiovascular: Normal rate and regular rhythm.  No murmur heard. Pulmonary/Chest: Effort normal and breath sounds normal.  Abdominal: Soft. Bowel  sounds are normal.  Ileal conduit RLQ of abdomen and right nephrostomy tube in posterior back. No tenderness or masses today.   Musculoskeletal:        General: Normal range of motion.     Cervical back: Neck supple.  Neurological: He is alert and oriented to person, place, and time.  Skin: No rash noted.  Psychiatric: Mood, affect and judgment normal.    Assessment and Plan :  1. Pre-op exam Nephrolithiais scheduled to undergo a right percutaneous nephrostolithotomy with Dr. Rosana Hoes Viprakasit on 02/09/20. Right nephrostomy tube with VIR in place since 01-31-20. Presently on Levaquin 750 mg qd for Citrobacter and Klebsiella on culture reported 02-04-20. No fever today or blood in collection bag. Slight peripheral edema lower extremities. EKG today shows NSR with non-specific changes from old anterior infarct and history of DES in LAD from [facility]. Feeling fairly well in general and cleared medically to proceed with urology procedure on 02-09-20. Has been off his ASA 81 mg qd for more than a week now and will not restart until after this procedure. Should proceed with pre-op labs to check blood counts, renal function, electrolytes and liver function.   2. Nephrolithiasis Multiple kidney stones and nephrostomy tubes since 2016.  3. S/P ileal conduit ([place]) Patent ostomy without discomfort.  4. Cancer of lateral wall of urinary bladder ([facility]) S/P ileal conduit with drainage to a bag for h/o pT3aN0 bladder cancer diagnosed 07-03-15 with GS 3+3 prostate cancer on pathology. Still has port in the right upper chest from past cancer treatments.  5. Arteriosclerosis of coronary artery Had DES placement in the LAD in 2006. No recent chest pains, palpitations or dyspnea. EKG today shows evidence of past MI but no acute changes with NSR. - EKG 12-Lead  6. Pure hypercholesterolemia Tolerating the Pravastatin 20 mg qd and low fat diet without side effects.

## 2020-02-07 DIAGNOSIS — I452 Bifascicular block: Secondary | ICD-10-CM | POA: Diagnosis not present

## 2020-02-07 DIAGNOSIS — N200 Calculus of kidney: Secondary | ICD-10-CM | POA: Diagnosis not present

## 2020-02-07 DIAGNOSIS — Z20822 Contact with and (suspected) exposure to covid-19: Secondary | ICD-10-CM | POA: Diagnosis not present

## 2020-02-07 DIAGNOSIS — I4510 Unspecified right bundle-branch block: Secondary | ICD-10-CM | POA: Diagnosis not present

## 2020-02-07 DIAGNOSIS — I444 Left anterior fascicular block: Secondary | ICD-10-CM | POA: Diagnosis not present

## 2020-02-07 DIAGNOSIS — Z01812 Encounter for preprocedural laboratory examination: Secondary | ICD-10-CM | POA: Diagnosis not present

## 2020-02-08 NOTE — Telephone Encounter (Signed)
Unable to leave message.  Surgical clearance has already been faxed

## 2020-02-09 DIAGNOSIS — Z538 Procedure and treatment not carried out for other reasons: Secondary | ICD-10-CM | POA: Diagnosis not present

## 2020-02-09 DIAGNOSIS — G8918 Other acute postprocedural pain: Secondary | ICD-10-CM | POA: Diagnosis not present

## 2020-02-09 DIAGNOSIS — R0902 Hypoxemia: Secondary | ICD-10-CM | POA: Diagnosis not present

## 2020-02-09 DIAGNOSIS — E785 Hyperlipidemia, unspecified: Secondary | ICD-10-CM | POA: Diagnosis not present

## 2020-02-09 DIAGNOSIS — I2510 Atherosclerotic heart disease of native coronary artery without angina pectoris: Secondary | ICD-10-CM | POA: Diagnosis not present

## 2020-02-09 DIAGNOSIS — Z8551 Personal history of malignant neoplasm of bladder: Secondary | ICD-10-CM | POA: Diagnosis not present

## 2020-02-09 DIAGNOSIS — I10 Essential (primary) hypertension: Secondary | ICD-10-CM | POA: Diagnosis not present

## 2020-02-09 DIAGNOSIS — Z87891 Personal history of nicotine dependence: Secondary | ICD-10-CM | POA: Diagnosis not present

## 2020-02-09 DIAGNOSIS — N200 Calculus of kidney: Secondary | ICD-10-CM | POA: Diagnosis not present

## 2020-02-09 DIAGNOSIS — Z7901 Long term (current) use of anticoagulants: Secondary | ICD-10-CM | POA: Diagnosis not present

## 2020-02-10 DIAGNOSIS — Z538 Procedure and treatment not carried out for other reasons: Secondary | ICD-10-CM | POA: Diagnosis not present

## 2020-02-10 DIAGNOSIS — I2510 Atherosclerotic heart disease of native coronary artery without angina pectoris: Secondary | ICD-10-CM | POA: Diagnosis not present

## 2020-02-10 DIAGNOSIS — I10 Essential (primary) hypertension: Secondary | ICD-10-CM | POA: Diagnosis not present

## 2020-02-10 DIAGNOSIS — E785 Hyperlipidemia, unspecified: Secondary | ICD-10-CM | POA: Diagnosis not present

## 2020-02-10 DIAGNOSIS — Z7901 Long term (current) use of anticoagulants: Secondary | ICD-10-CM | POA: Diagnosis not present

## 2020-02-10 DIAGNOSIS — N200 Calculus of kidney: Secondary | ICD-10-CM | POA: Diagnosis not present

## 2020-02-10 DIAGNOSIS — R0902 Hypoxemia: Secondary | ICD-10-CM | POA: Diagnosis not present

## 2020-02-10 DIAGNOSIS — Z87891 Personal history of nicotine dependence: Secondary | ICD-10-CM | POA: Diagnosis not present

## 2020-02-10 DIAGNOSIS — M7989 Other specified soft tissue disorders: Secondary | ICD-10-CM | POA: Diagnosis not present

## 2020-02-10 DIAGNOSIS — Z8551 Personal history of malignant neoplasm of bladder: Secondary | ICD-10-CM | POA: Diagnosis not present

## 2020-02-15 DIAGNOSIS — Z01812 Encounter for preprocedural laboratory examination: Secondary | ICD-10-CM | POA: Diagnosis not present

## 2020-02-15 DIAGNOSIS — Z20822 Contact with and (suspected) exposure to covid-19: Secondary | ICD-10-CM | POA: Diagnosis not present

## 2020-02-17 DIAGNOSIS — N200 Calculus of kidney: Secondary | ICD-10-CM | POA: Diagnosis not present

## 2020-02-17 DIAGNOSIS — I2510 Atherosclerotic heart disease of native coronary artery without angina pectoris: Secondary | ICD-10-CM | POA: Diagnosis not present

## 2020-02-17 DIAGNOSIS — E785 Hyperlipidemia, unspecified: Secondary | ICD-10-CM | POA: Diagnosis not present

## 2020-02-17 DIAGNOSIS — I10 Essential (primary) hypertension: Secondary | ICD-10-CM | POA: Diagnosis not present

## 2020-02-17 DIAGNOSIS — C679 Malignant neoplasm of bladder, unspecified: Secondary | ICD-10-CM | POA: Diagnosis not present

## 2020-02-17 DIAGNOSIS — Z923 Personal history of irradiation: Secondary | ICD-10-CM | POA: Diagnosis not present

## 2020-02-17 DIAGNOSIS — K435 Parastomal hernia without obstruction or  gangrene: Secondary | ICD-10-CM | POA: Diagnosis not present

## 2020-02-17 DIAGNOSIS — Z87891 Personal history of nicotine dependence: Secondary | ICD-10-CM | POA: Diagnosis not present

## 2020-02-18 DIAGNOSIS — Z87891 Personal history of nicotine dependence: Secondary | ICD-10-CM | POA: Diagnosis not present

## 2020-02-18 DIAGNOSIS — N200 Calculus of kidney: Secondary | ICD-10-CM | POA: Diagnosis not present

## 2020-02-18 DIAGNOSIS — E785 Hyperlipidemia, unspecified: Secondary | ICD-10-CM | POA: Diagnosis not present

## 2020-02-18 DIAGNOSIS — C679 Malignant neoplasm of bladder, unspecified: Secondary | ICD-10-CM | POA: Diagnosis not present

## 2020-02-18 DIAGNOSIS — N134 Hydroureter: Secondary | ICD-10-CM | POA: Diagnosis not present

## 2020-02-18 DIAGNOSIS — Z923 Personal history of irradiation: Secondary | ICD-10-CM | POA: Diagnosis not present

## 2020-02-18 DIAGNOSIS — K435 Parastomal hernia without obstruction or  gangrene: Secondary | ICD-10-CM | POA: Diagnosis not present

## 2020-02-18 DIAGNOSIS — I10 Essential (primary) hypertension: Secondary | ICD-10-CM | POA: Diagnosis not present

## 2020-02-18 DIAGNOSIS — I2510 Atherosclerotic heart disease of native coronary artery without angina pectoris: Secondary | ICD-10-CM | POA: Diagnosis not present

## 2020-02-27 DIAGNOSIS — L120 Bullous pemphigoid: Secondary | ICD-10-CM | POA: Diagnosis not present

## 2020-02-27 DIAGNOSIS — Z1159 Encounter for screening for other viral diseases: Secondary | ICD-10-CM | POA: Diagnosis not present

## 2020-02-27 DIAGNOSIS — Z79899 Other long term (current) drug therapy: Secondary | ICD-10-CM | POA: Diagnosis not present

## 2020-02-27 DIAGNOSIS — Z7289 Other problems related to lifestyle: Secondary | ICD-10-CM | POA: Diagnosis not present

## 2020-02-28 DIAGNOSIS — Z7289 Other problems related to lifestyle: Secondary | ICD-10-CM | POA: Diagnosis not present

## 2020-02-28 DIAGNOSIS — Z79899 Other long term (current) drug therapy: Secondary | ICD-10-CM | POA: Diagnosis not present

## 2020-02-28 DIAGNOSIS — Z1159 Encounter for screening for other viral diseases: Secondary | ICD-10-CM | POA: Diagnosis not present

## 2020-04-05 DIAGNOSIS — Z79899 Other long term (current) drug therapy: Secondary | ICD-10-CM | POA: Diagnosis not present

## 2020-04-14 DIAGNOSIS — Z936 Other artificial openings of urinary tract status: Secondary | ICD-10-CM | POA: Diagnosis not present

## 2020-04-14 DIAGNOSIS — C7911 Secondary malignant neoplasm of bladder: Secondary | ICD-10-CM | POA: Diagnosis not present

## 2020-04-17 DIAGNOSIS — N200 Calculus of kidney: Secondary | ICD-10-CM | POA: Diagnosis not present

## 2020-04-17 DIAGNOSIS — Z8551 Personal history of malignant neoplasm of bladder: Secondary | ICD-10-CM | POA: Diagnosis not present

## 2020-04-17 DIAGNOSIS — Z09 Encounter for follow-up examination after completed treatment for conditions other than malignant neoplasm: Secondary | ICD-10-CM | POA: Diagnosis not present

## 2020-04-17 DIAGNOSIS — N261 Atrophy of kidney (terminal): Secondary | ICD-10-CM | POA: Diagnosis not present

## 2020-04-30 DIAGNOSIS — L120 Bullous pemphigoid: Secondary | ICD-10-CM | POA: Diagnosis not present

## 2020-05-28 DIAGNOSIS — R918 Other nonspecific abnormal finding of lung field: Secondary | ICD-10-CM | POA: Diagnosis not present

## 2020-05-28 DIAGNOSIS — R911 Solitary pulmonary nodule: Secondary | ICD-10-CM | POA: Diagnosis not present

## 2020-05-28 DIAGNOSIS — N200 Calculus of kidney: Secondary | ICD-10-CM | POA: Diagnosis not present

## 2020-05-28 DIAGNOSIS — C672 Malignant neoplasm of lateral wall of bladder: Secondary | ICD-10-CM | POA: Diagnosis not present

## 2020-06-26 DIAGNOSIS — Z79899 Other long term (current) drug therapy: Secondary | ICD-10-CM | POA: Diagnosis not present

## 2020-06-26 DIAGNOSIS — L120 Bullous pemphigoid: Secondary | ICD-10-CM | POA: Diagnosis not present

## 2020-06-28 DIAGNOSIS — Z79899 Other long term (current) drug therapy: Secondary | ICD-10-CM | POA: Diagnosis not present

## 2020-07-12 DIAGNOSIS — C7911 Secondary malignant neoplasm of bladder: Secondary | ICD-10-CM | POA: Diagnosis not present

## 2020-07-12 DIAGNOSIS — Z936 Other artificial openings of urinary tract status: Secondary | ICD-10-CM | POA: Diagnosis not present

## 2020-08-13 DIAGNOSIS — L120 Bullous pemphigoid: Secondary | ICD-10-CM | POA: Diagnosis not present

## 2020-08-13 DIAGNOSIS — Z79899 Other long term (current) drug therapy: Secondary | ICD-10-CM | POA: Diagnosis not present

## 2020-08-14 DIAGNOSIS — Z79899 Other long term (current) drug therapy: Secondary | ICD-10-CM | POA: Diagnosis not present

## 2020-10-19 DIAGNOSIS — L120 Bullous pemphigoid: Secondary | ICD-10-CM | POA: Diagnosis not present

## 2020-10-19 DIAGNOSIS — Z79899 Other long term (current) drug therapy: Secondary | ICD-10-CM | POA: Diagnosis not present

## 2020-11-12 ENCOUNTER — Encounter: Payer: Self-pay | Admitting: Family Medicine

## 2020-11-12 ENCOUNTER — Ambulatory Visit (INDEPENDENT_AMBULATORY_CARE_PROVIDER_SITE_OTHER): Payer: Medicare HMO | Admitting: Family Medicine

## 2020-11-12 ENCOUNTER — Other Ambulatory Visit: Payer: Self-pay

## 2020-11-12 VITALS — BP 128/92 | HR 97 | Temp 98.1°F | Wt 142.0 lb

## 2020-11-12 DIAGNOSIS — E7800 Pure hypercholesterolemia, unspecified: Secondary | ICD-10-CM

## 2020-11-12 DIAGNOSIS — C672 Malignant neoplasm of lateral wall of bladder: Secondary | ICD-10-CM

## 2020-11-12 DIAGNOSIS — L120 Bullous pemphigoid: Secondary | ICD-10-CM | POA: Diagnosis not present

## 2020-11-12 DIAGNOSIS — R7401 Elevation of levels of liver transaminase levels: Secondary | ICD-10-CM | POA: Diagnosis not present

## 2020-11-12 DIAGNOSIS — R748 Abnormal levels of other serum enzymes: Secondary | ICD-10-CM | POA: Diagnosis not present

## 2020-11-12 DIAGNOSIS — I10 Essential (primary) hypertension: Secondary | ICD-10-CM | POA: Diagnosis not present

## 2020-11-12 MED ORDER — PRAVASTATIN SODIUM 20 MG PO TABS: 20.0000 mg | ORAL_TABLET | Freq: Every day | ORAL | 3 refills | Status: DC

## 2020-11-12 NOTE — Progress Notes (Signed)
Established patient visit   Patient: [name]   DOB: 09/03/1936   84 y.o. Male  MRN: [id number] Visit Date: 11/12/2020  Today's healthcare provider: [name], PA   No chief complaint on file.  Subjective    HPI   Lipid/Cholesterol, Follow-up  Last lipid panel Other pertinent labs  Lab Results  Component Value Date   CHOL 146 08/26/2019   HDL 63 08/26/2019   LDLCALC 74 08/26/2019   TRIG 37 08/26/2019   CHOLHDL 2.3 08/26/2019   Lab Results  Component Value Date   ALT 18 08/26/2019   AST 21 08/26/2019   PLT 273 08/26/2019   TSH 2.510 08/26/2019     He was last seen for this 9 months ago. Last lipids were done 14 months ago. Management since that visit includes none.  He reports good compliance with treatment. He is not having side effects.  Patient only complains of leg and hand cramps but didn't relate it to any of his medications  Symptoms: No chest pain No chest pressure/discomfort  No dyspnea No lower extremity edema  No numbness or tingling of extremity No orthopnea  No palpitations No paroxysmal nocturnal dyspnea  No speech difficulty No syncope   The ASCVD Risk score (Rives., et al., 2013) failed to calculate for the following reasons:   The 2013 ASCVD risk score is only valid for ages 93 to 12   Medications: Outpatient Medications Prior to Visit  Medication Sig  . acetaminophen (TYLENOL) 325 MG tablet Take 650 mg by mouth every 6 (six) hours as needed.  Marland Kitchen aspirin 81 MG tablet Take 1 tablet by mouth daily.   . cetirizine (ZYRTEC) 10 MG tablet Take 10 mg by mouth daily.  . diphenhydramine-acetaminophen (TYLENOL PM) 25-500 MG TABS tablet Take 1 tablet by mouth at bedtime as needed.  . Multiple Vitamin (MULTI-VITAMINS) TABS Take by mouth.  . neomycin-bacitracin-polymyxin (NEOSPORIN) [phone] ointment Apply 1 application topically as needed.  Oneta Rack Supplies (SENSURA DRAINABLE) Pouch MISC Ostomy supplies as needed.  Diagnosis: Bladder Cancer (C67.9)  . pravastatin (PRAVACHOL) 20 MG tablet TAKE 1 TABLET (20 MG TOTAL) BY MOUTH DAILY.  . Skin Protectants, Misc. (CAVILON NO STING BARRIER FILM EX) Apply topically as needed.  . triamcinolone cream (KENALOG) 0.1 % Apply 1 application topically 2 (two) times daily.    No facility-administered medications prior to visit.    Review of Systems  Constitutional: Negative.   HENT: Negative.   Respiratory: Negative.   Cardiovascular: Negative.   Genitourinary: Negative.   Musculoskeletal: Negative.   Skin: Negative.        Improving.  Neurological: Negative.       Objective    BP (!) 128/92 (BP Location: Right Arm, Patient Position: Sitting, Cuff Size: Normal)   Pulse 97   Temp 98.1 F (36.7 C) (Oral)   Wt 142 lb (64.4 kg)   SpO2 99%   BMI 22.24 kg/m  BP Readings from Last 3 Encounters:  11/12/20 (!) 128/92  02/06/20 (!) 150/72  08/26/19 134/70   Wt Readings from Last 3 Encounters:  11/12/20 142 lb (64.4 kg)  02/06/20 144 lb (65.3 kg)  08/26/19 142 lb (64.4 kg)     Physical Exam Constitutional:      General: He is not in acute distress.    Appearance: He is well-developed.  HENT:     Head: Normocephalic and atraumatic.     Right Ear: Hearing normal.     Left  Ear: Hearing normal.     Nose: Nose normal.  Eyes:     General: Lids are normal. No scleral icterus.       Right eye: No discharge.        Left eye: No discharge.     Conjunctiva/sclera: Conjunctivae normal.  Cardiovascular:     Rate and Rhythm: Normal rate and regular rhythm.     Pulses: Normal pulses.     Heart sounds: Normal heart sounds.  Pulmonary:     Effort: Pulmonary effort is normal. No respiratory distress.  Musculoskeletal:        General: Normal range of motion.     Cervical back: Neck supple.  Skin:    Findings: No lesion or rash.  Neurological:     Mental Status: He is alert and oriented to person, place, and time.  Psychiatric:        Speech: Speech  normal.        Behavior: Behavior normal.        Thought Content: Thought content normal.      No results found for any visits on 11/12/20.  Assessment & Plan     1. Pure hypercholesterolemia Tolerating the Pravastatin without side effects. Recheck labs and follow up pending reports. - pravastatin (PRAVACHOL) 20 MG tablet; Take 1 tablet (20 mg total) by mouth daily.  Dispense: 90 tablet; Refill: 3 - Comprehensive metabolic panel - Lipid Panel With LDL/HDL Ratio - TSH  2. Benign essential HTN Fair control. Recheck routine labs. Continue low fat and restricted salt diet. - CBC with Differential/Platelet - Comprehensive metabolic panel - Lipid Panel With LDL/HDL Ratio - TSH  3. Malignant neoplasm of lateral wall of urinary bladder (HCC) Followed by Dr. Aleene Davidson (urologist) [facility]. Has ileal conduit CBC with Differential/Platelet - Comprehensive metabolic panel  4. Bullous pemphigoid Followed by dermatologist (Dr. Will Bonnet) and presently on Methotrexate 2.5 mg eight tablets once a week. States lesion are much improved with this regimen. Recheck labs and follow up prn. - CBC with Differential/Platelet - Comprehensive metabolic panel   No follow-ups on file.      Andres Shad, PA, have reviewed all documentation for this visit. The documentation on 11/12/20 for the exam, diagnosis, procedures, and orders are all accurate and complete.    [name], [place] [phone] (phone) [phone] (fax)  [place]

## 2020-11-13 LAB — TSH: TSH: 2.24 u[IU]/mL (ref 0.450–4.500)

## 2020-11-13 LAB — COMPREHENSIVE METABOLIC PANEL
ALT: 179 IU/L — ABNORMAL HIGH (ref 0–44)
AST: 91 IU/L — ABNORMAL HIGH (ref 0–40)
Albumin/Globulin Ratio: 1.8 (ref 1.2–2.2)
Albumin: 4.6 g/dL (ref 3.6–4.6)
Alkaline Phosphatase: 141 IU/L — ABNORMAL HIGH (ref 44–121)
BUN/Creatinine Ratio: 14 (ref 10–24)
BUN: 17 mg/dL (ref 8–27)
Bilirubin Total: 0.8 mg/dL (ref 0.0–1.2)
CO2: 23 mmol/L (ref 20–29)
Calcium: 10.2 mg/dL (ref 8.6–10.2)
Chloride: 104 mmol/L (ref 96–106)
Creatinine, Ser: 1.25 mg/dL (ref 0.76–1.27)
GFR calc Af Amer: 62 mL/min/{1.73_m2} (ref >59)
GFR calc non Af Amer: 53 mL/min/{1.73_m2} — ABNORMAL LOW (ref >59)
Globulin, Total: 2.5 g/dL (ref 1.5–4.5)
Glucose: 99 mg/dL (ref 65–99)
Potassium: 4.7 mmol/L (ref 3.5–5.2)
Sodium: 139 mmol/L (ref 134–144)
Total Protein: 7.1 g/dL (ref 6.0–8.5)

## 2020-11-13 LAB — CBC WITH DIFFERENTIAL/PLATELET
Basophils Absolute: 0.1 10*3/uL (ref 0.0–0.2)
Basos: 1 %
EOS (ABSOLUTE): 0.1 10*3/uL (ref 0.0–0.4)
Eos: 1 %
Hematocrit: 43.1 % (ref 37.5–51.0)
Hemoglobin: 14 g/dL (ref 13.0–17.7)
Immature Grans (Abs): 0.1 10*3/uL (ref 0.0–0.1)
Immature Granulocytes: 1 %
Lymphocytes Absolute: 0.6 10*3/uL — ABNORMAL LOW (ref 0.7–3.1)
Lymphs: 8 %
MCH: 29.5 pg (ref 26.6–33.0)
MCHC: 32.5 g/dL (ref 31.5–35.7)
MCV: 91 fL (ref 79–97)
Monocytes Absolute: 0.4 10*3/uL (ref 0.1–0.9)
Monocytes: 6 %
Neutrophils Absolute: 6.3 10*3/uL (ref 1.4–7.0)
Neutrophils: 83 %
Platelets: 266 10*3/uL (ref 150–450)
RBC: 4.75 x10E6/uL (ref 4.14–5.80)
RDW: 14.6 % (ref 11.6–15.4)
WBC: 7.5 10*3/uL (ref 3.4–10.8)

## 2020-11-13 LAB — LIPID PANEL WITH LDL/HDL RATIO
Cholesterol, Total: 188 mg/dL (ref 100–199)
HDL: 65 mg/dL (ref >39)
LDL Chol Calc (NIH): 113 mg/dL — ABNORMAL HIGH (ref 0–99)
LDL/HDL Ratio: 1.7 ratio (ref 0.0–3.6)
Triglycerides: 52 mg/dL (ref 0–149)
VLDL Cholesterol Cal: 10 mg/dL (ref 5–40)

## 2020-11-15 ENCOUNTER — Other Ambulatory Visit: Payer: Self-pay

## 2020-11-21 LAB — ACUTE HEP PANEL AND HEP B SURFACE AB
Hep A IgM: NEGATIVE
Hep B C IgM: NEGATIVE
Hep C Virus Ab: <0.1 s/co ratio (ref 0.0–0.9)
Hepatitis B Surf Ab Quant: <3.1 m[IU]/mL — ABNORMAL LOW (ref >9.9)
Hepatitis B Surface Ag: NEGATIVE

## 2020-11-21 LAB — SPECIMEN STATUS REPORT

## 2020-12-10 DIAGNOSIS — C7911 Secondary malignant neoplasm of bladder: Secondary | ICD-10-CM | POA: Diagnosis not present

## 2020-12-10 DIAGNOSIS — Z936 Other artificial openings of urinary tract status: Secondary | ICD-10-CM | POA: Diagnosis not present

## 2021-01-01 DIAGNOSIS — N200 Calculus of kidney: Secondary | ICD-10-CM | POA: Diagnosis not present

## 2021-01-28 ENCOUNTER — Telehealth: Payer: Self-pay | Admitting: Family Medicine

## 2021-01-28 NOTE — Telephone Encounter (Signed)
Copied from [place] [id number]. Topic: Medicare AWV >> Jan 28, 2021  2:23 PM Cher Nakai R wrote: Reason for CRM: Left message for patient to call back and schedule Medicare Annual Wellness Visit (AWV) in office.   If not able to come in office, please offer to do virtually or by telephone.   Last AWV 02/01/2021  Please schedule at anytime with [facility] Health Advisor.  If any questions, please contact me at [phone]

## 2021-02-01 DIAGNOSIS — Z79899 Other long term (current) drug therapy: Secondary | ICD-10-CM | POA: Diagnosis not present

## 2021-02-01 DIAGNOSIS — L120 Bullous pemphigoid: Secondary | ICD-10-CM | POA: Diagnosis not present

## 2021-02-05 DIAGNOSIS — Z79899 Other long term (current) drug therapy: Secondary | ICD-10-CM | POA: Diagnosis not present

## 2021-03-04 ENCOUNTER — Ambulatory Visit: Payer: Self-pay

## 2021-03-04 ENCOUNTER — Telehealth: Payer: Self-pay

## 2021-03-04 ENCOUNTER — Ambulatory Visit: Payer: Self-pay | Admitting: *Deleted

## 2021-03-04 DIAGNOSIS — R000 Tachycardia, unspecified: Secondary | ICD-10-CM | POA: Diagnosis not present

## 2021-03-04 NOTE — Telephone Encounter (Signed)
Pull a tick off 3 weeks ago. Sx poor appetite, not feeling well.  Feels weak and has  Lost 10 lbs says he has lost appetite. Denies rash. Tick was under left arm. Fever comes and goes. Yesterday took Tylenol for fever: highest fever 101 yesterday. Pt sees red spot size of "matchhead."   No openings at PCP office until Wednesday. Called [facility] and advised pt to go to [facility].  Advised pt to go to [facility] and pt verbalized understanding. Reason for Disposition . [1] 2 to 14 days following tick bite AND [2] fever AND [3] no rash or headache    FC Elena advised [facility] due to no appointments  Answer Assessment - Initial Assessment Questions 1. TYPE of TICK: "Is it a wood tick or a deer tick?" If unsure, ask: "What size was the tick?" "Did it look more like a watermelon seed or a poppy seed?"      Wood tick 2. LOCATION: "Where is the tick bite located?"      Under left arm near axilla 3. ONSET: "How long do you think the tick was attached before you removed it?" (Hours or days)      Pt unsure- was not engorged 4. TETANUS: "When was the last tetanus booster?"      *No Answer*  Protocols used: TICK BITE-A-AH

## 2021-03-04 NOTE — Telephone Encounter (Signed)
Pt's daughter calling, see previous encounters. States "The UC doctor said it is urgent he be seen and have labs drawn as soon as possible.  He needs to at least have labs early tomorrow." Advised NT would route to practice for PCPs review. Aware practice is currently closed. CB# [phone] Cheron Every)

## 2021-03-04 NOTE — Telephone Encounter (Signed)
Copied from [place] [phone]. Topic: General - Other >> Mar 04, 2021  4:34 PM Pawlus, Brayton Layman A wrote: Reason for CRM: Pts daughter called very upset that I could not get an appt for the Pt tomorrow, Caller stated he must be worked in somehow. Please call back. >> Mar 04, 2021  4:47 PM Pawlus, Brayton Layman A wrote: Pts daughter refused to take the first available appt on 3/25, I offered to get a nurse on the line again and she said No.  Pt wants a call back today FYI

## 2021-03-05 DIAGNOSIS — R5383 Other fatigue: Secondary | ICD-10-CM | POA: Diagnosis not present

## 2021-03-05 DIAGNOSIS — R000 Tachycardia, unspecified: Secondary | ICD-10-CM | POA: Diagnosis not present

## 2021-03-05 DIAGNOSIS — K435 Parastomal hernia without obstruction or  gangrene: Secondary | ICD-10-CM | POA: Diagnosis not present

## 2021-03-05 DIAGNOSIS — N390 Urinary tract infection, site not specified: Secondary | ICD-10-CM | POA: Diagnosis not present

## 2021-03-05 DIAGNOSIS — R918 Other nonspecific abnormal finding of lung field: Secondary | ICD-10-CM | POA: Diagnosis not present

## 2021-03-05 DIAGNOSIS — E785 Hyperlipidemia, unspecified: Secondary | ICD-10-CM | POA: Diagnosis not present

## 2021-03-05 DIAGNOSIS — R9341 Abnormal radiologic findings on diagnostic imaging of renal pelvis, ureter, or bladder: Secondary | ICD-10-CM | POA: Diagnosis not present

## 2021-03-05 DIAGNOSIS — E43 Unspecified severe protein-calorie malnutrition: Secondary | ICD-10-CM | POA: Diagnosis not present

## 2021-03-05 DIAGNOSIS — Z681 Body mass index (BMI) 19 or less, adult: Secondary | ICD-10-CM | POA: Diagnosis not present

## 2021-03-05 DIAGNOSIS — E8809 Other disorders of plasma-protein metabolism, not elsewhere classified: Secondary | ICD-10-CM | POA: Diagnosis not present

## 2021-03-05 DIAGNOSIS — L120 Bullous pemphigoid: Secondary | ICD-10-CM | POA: Diagnosis not present

## 2021-03-05 DIAGNOSIS — D849 Immunodeficiency, unspecified: Secondary | ICD-10-CM | POA: Diagnosis not present

## 2021-03-05 DIAGNOSIS — J189 Pneumonia, unspecified organism: Secondary | ICD-10-CM | POA: Diagnosis not present

## 2021-03-05 DIAGNOSIS — Z8589 Personal history of malignant neoplasm of other organs and systems: Secondary | ICD-10-CM | POA: Diagnosis not present

## 2021-03-05 DIAGNOSIS — C7911 Secondary malignant neoplasm of bladder: Secondary | ICD-10-CM | POA: Diagnosis not present

## 2021-03-05 DIAGNOSIS — I2510 Atherosclerotic heart disease of native coronary artery without angina pectoris: Secondary | ICD-10-CM | POA: Diagnosis not present

## 2021-03-05 DIAGNOSIS — R531 Weakness: Secondary | ICD-10-CM | POA: Diagnosis not present

## 2021-03-05 DIAGNOSIS — Z936 Other artificial openings of urinary tract status: Secondary | ICD-10-CM | POA: Diagnosis not present

## 2021-03-05 DIAGNOSIS — R109 Unspecified abdominal pain: Secondary | ICD-10-CM | POA: Diagnosis not present

## 2021-03-05 DIAGNOSIS — N200 Calculus of kidney: Secondary | ICD-10-CM | POA: Diagnosis not present

## 2021-03-05 DIAGNOSIS — R634 Abnormal weight loss: Secondary | ICD-10-CM | POA: Diagnosis not present

## 2021-03-05 NOTE — Telephone Encounter (Signed)
Please advise 

## 2021-03-05 NOTE — Telephone Encounter (Signed)
Unable to get call to go through.  If patient or daughter calls back please put call through to office as we are going to try and get them in at 3:20

## 2021-03-05 NOTE — Telephone Encounter (Signed)
Unable to get call to go through.  If patient or daughter calls back please put call through to office as we are going to try and get them in at 3:20 

## 2021-03-05 NOTE — Telephone Encounter (Signed)
Patient's daughter called, invalid number. Patient called on his number listed, left VM to return the call to the office to be scheduled for this afternoon.

## 2021-03-05 NOTE — Telephone Encounter (Signed)
Do I have 320 appt available?

## 2021-03-06 NOTE — Telephone Encounter (Signed)
240 today looks like the only slot I have.

## 2021-03-06 NOTE — Telephone Encounter (Signed)
Dr. Rosanna Randy, pt was unable to be seen yesterday. Did you want to work him in?

## 2021-03-06 NOTE — Telephone Encounter (Signed)
Tried calling patient and patient's daughter Jeannene Patella)  and no answer. Unable to leave a VM.

## 2021-03-21 ENCOUNTER — Inpatient Hospital Stay: Payer: Medicare HMO | Admitting: Family Medicine

## 2021-04-04 DIAGNOSIS — I10 Essential (primary) hypertension: Secondary | ICD-10-CM | POA: Diagnosis not present

## 2021-04-04 DIAGNOSIS — L24B3 Irritant contact dermatitis related to fecal or urinary stoma or fistula: Secondary | ICD-10-CM | POA: Diagnosis not present

## 2021-04-04 DIAGNOSIS — N200 Calculus of kidney: Secondary | ICD-10-CM | POA: Diagnosis not present

## 2021-04-04 DIAGNOSIS — R918 Other nonspecific abnormal finding of lung field: Secondary | ICD-10-CM | POA: Diagnosis not present

## 2021-04-04 DIAGNOSIS — K589 Irritable bowel syndrome without diarrhea: Secondary | ICD-10-CM | POA: Diagnosis not present

## 2021-04-04 DIAGNOSIS — Z9079 Acquired absence of other genital organ(s): Secondary | ICD-10-CM | POA: Diagnosis not present

## 2021-04-04 DIAGNOSIS — R0989 Other specified symptoms and signs involving the circulatory and respiratory systems: Secondary | ICD-10-CM | POA: Diagnosis not present

## 2021-04-04 DIAGNOSIS — Z7982 Long term (current) use of aspirin: Secondary | ICD-10-CM | POA: Diagnosis not present

## 2021-04-04 DIAGNOSIS — I2510 Atherosclerotic heart disease of native coronary artery without angina pectoris: Secondary | ICD-10-CM | POA: Diagnosis not present

## 2021-04-04 DIAGNOSIS — C672 Malignant neoplasm of lateral wall of bladder: Secondary | ICD-10-CM | POA: Diagnosis not present

## 2021-04-04 DIAGNOSIS — R9341 Abnormal radiologic findings on diagnostic imaging of renal pelvis, ureter, or bladder: Secondary | ICD-10-CM | POA: Diagnosis not present

## 2021-04-18 DIAGNOSIS — N200 Calculus of kidney: Secondary | ICD-10-CM | POA: Diagnosis not present

## 2021-05-06 DIAGNOSIS — Z79899 Other long term (current) drug therapy: Secondary | ICD-10-CM | POA: Diagnosis not present

## 2021-05-06 DIAGNOSIS — L120 Bullous pemphigoid: Secondary | ICD-10-CM | POA: Diagnosis not present

## 2021-06-04 DIAGNOSIS — C672 Malignant neoplasm of lateral wall of bladder: Secondary | ICD-10-CM | POA: Diagnosis not present

## 2021-08-05 DIAGNOSIS — Z79899 Other long term (current) drug therapy: Secondary | ICD-10-CM | POA: Diagnosis not present

## 2021-08-05 DIAGNOSIS — L120 Bullous pemphigoid: Secondary | ICD-10-CM | POA: Diagnosis not present

## 2021-08-12 DIAGNOSIS — Z79899 Other long term (current) drug therapy: Secondary | ICD-10-CM | POA: Diagnosis not present

## 2021-08-22 ENCOUNTER — Other Ambulatory Visit: Payer: Self-pay | Admitting: Family Medicine

## 2021-08-22 DIAGNOSIS — E7800 Pure hypercholesterolemia, unspecified: Secondary | ICD-10-CM

## 2021-10-10 DIAGNOSIS — N132 Hydronephrosis with renal and ureteral calculous obstruction: Secondary | ICD-10-CM | POA: Diagnosis not present

## 2021-10-10 DIAGNOSIS — I10 Essential (primary) hypertension: Secondary | ICD-10-CM | POA: Diagnosis not present

## 2021-10-10 DIAGNOSIS — R1310 Dysphagia, unspecified: Secondary | ICD-10-CM | POA: Diagnosis not present

## 2021-10-10 DIAGNOSIS — C672 Malignant neoplasm of lateral wall of bladder: Secondary | ICD-10-CM | POA: Diagnosis not present

## 2021-10-10 DIAGNOSIS — Z87891 Personal history of nicotine dependence: Secondary | ICD-10-CM | POA: Diagnosis not present

## 2021-10-10 DIAGNOSIS — R93422 Abnormal radiologic findings on diagnostic imaging of left kidney: Secondary | ICD-10-CM | POA: Diagnosis not present

## 2021-10-10 DIAGNOSIS — Z801 Family history of malignant neoplasm of trachea, bronchus and lung: Secondary | ICD-10-CM | POA: Diagnosis not present

## 2021-10-10 DIAGNOSIS — K589 Irritable bowel syndrome without diarrhea: Secondary | ICD-10-CM | POA: Diagnosis not present

## 2021-10-10 DIAGNOSIS — R93421 Abnormal radiologic findings on diagnostic imaging of right kidney: Secondary | ICD-10-CM | POA: Diagnosis not present

## 2021-10-10 DIAGNOSIS — Z886 Allergy status to analgesic agent status: Secondary | ICD-10-CM | POA: Diagnosis not present

## 2021-10-10 DIAGNOSIS — R918 Other nonspecific abnormal finding of lung field: Secondary | ICD-10-CM | POA: Diagnosis not present

## 2021-10-10 DIAGNOSIS — Z923 Personal history of irradiation: Secondary | ICD-10-CM | POA: Diagnosis not present

## 2021-10-10 DIAGNOSIS — N200 Calculus of kidney: Secondary | ICD-10-CM | POA: Diagnosis not present

## 2021-12-12 ENCOUNTER — Telehealth: Payer: Self-pay

## 2021-12-12 NOTE — Telephone Encounter (Signed)
Copied from [place] [phone]. Topic: General - Other >> Dec 12, 2021  3:27 PM Alanda Slim E wrote: Reason for CRM: Daughter called to see if Pts records were faxed to [facility] clinic to Dr. Netty Starring pt has an appt on January 9th / the provider advised pt that he hasnt received any records from [facility]/ please call daughter and advise

## 2021-12-23 DIAGNOSIS — J40 Bronchitis, not specified as acute or chronic: Secondary | ICD-10-CM | POA: Diagnosis not present

## 2021-12-23 DIAGNOSIS — C672 Malignant neoplasm of lateral wall of bladder: Secondary | ICD-10-CM | POA: Diagnosis not present

## 2021-12-23 DIAGNOSIS — U071 COVID-19: Secondary | ICD-10-CM | POA: Diagnosis not present

## 2021-12-23 DIAGNOSIS — Z936 Other artificial openings of urinary tract status: Secondary | ICD-10-CM | POA: Diagnosis not present

## 2021-12-23 DIAGNOSIS — L120 Bullous pemphigoid: Secondary | ICD-10-CM | POA: Diagnosis not present

## 2021-12-23 NOTE — Telephone Encounter (Signed)
Pts daughter Cheron Every, is calling to follow up on Medical Record to be sent to [place] clinic. Please advise  [phone]

## 2022-01-07 DIAGNOSIS — Z8616 Personal history of COVID-19: Secondary | ICD-10-CM | POA: Diagnosis not present

## 2022-01-07 DIAGNOSIS — R413 Other amnesia: Secondary | ICD-10-CM | POA: Diagnosis not present

## 2022-01-07 DIAGNOSIS — R630 Anorexia: Secondary | ICD-10-CM | POA: Diagnosis not present

## 2022-01-07 DIAGNOSIS — R5383 Other fatigue: Secondary | ICD-10-CM | POA: Diagnosis not present

## 2022-01-15 DIAGNOSIS — R899 Unspecified abnormal finding in specimens from other organs, systems and tissues: Secondary | ICD-10-CM | POA: Diagnosis not present

## 2022-01-30 DIAGNOSIS — Z79899 Other long term (current) drug therapy: Secondary | ICD-10-CM | POA: Diagnosis not present

## 2022-02-20 DIAGNOSIS — I10 Essential (primary) hypertension: Secondary | ICD-10-CM | POA: Diagnosis not present

## 2022-02-20 DIAGNOSIS — E782 Mixed hyperlipidemia: Secondary | ICD-10-CM | POA: Diagnosis not present

## 2022-02-20 DIAGNOSIS — Z0000 Encounter for general adult medical examination without abnormal findings: Secondary | ICD-10-CM | POA: Diagnosis not present

## 2022-02-20 DIAGNOSIS — R252 Cramp and spasm: Secondary | ICD-10-CM | POA: Diagnosis not present

## 2022-02-20 DIAGNOSIS — L120 Bullous pemphigoid: Secondary | ICD-10-CM | POA: Diagnosis not present

## 2022-02-20 DIAGNOSIS — C672 Malignant neoplasm of lateral wall of bladder: Secondary | ICD-10-CM | POA: Diagnosis not present

## 2022-02-20 DIAGNOSIS — I2510 Atherosclerotic heart disease of native coronary artery without angina pectoris: Secondary | ICD-10-CM | POA: Diagnosis not present

## 2022-02-24 DIAGNOSIS — L120 Bullous pemphigoid: Secondary | ICD-10-CM | POA: Diagnosis not present

## 2022-02-24 DIAGNOSIS — Z79899 Other long term (current) drug therapy: Secondary | ICD-10-CM | POA: Diagnosis not present

## 2022-04-14 DIAGNOSIS — N132 Hydronephrosis with renal and ureteral calculous obstruction: Secondary | ICD-10-CM | POA: Diagnosis not present

## 2022-04-14 DIAGNOSIS — C672 Malignant neoplasm of lateral wall of bladder: Secondary | ICD-10-CM | POA: Diagnosis not present

## 2022-05-01 DIAGNOSIS — Z79899 Other long term (current) drug therapy: Secondary | ICD-10-CM | POA: Diagnosis not present

## 2022-07-24 DIAGNOSIS — J40 Bronchitis, not specified as acute or chronic: Secondary | ICD-10-CM | POA: Diagnosis not present

## 2022-07-24 DIAGNOSIS — H9202 Otalgia, left ear: Secondary | ICD-10-CM | POA: Diagnosis not present

## 2022-08-04 DIAGNOSIS — Z79899 Other long term (current) drug therapy: Secondary | ICD-10-CM | POA: Diagnosis not present

## 2022-08-26 DIAGNOSIS — J432 Centrilobular emphysema: Secondary | ICD-10-CM | POA: Diagnosis not present

## 2022-08-26 DIAGNOSIS — I2510 Atherosclerotic heart disease of native coronary artery without angina pectoris: Secondary | ICD-10-CM | POA: Diagnosis not present

## 2022-08-26 DIAGNOSIS — L120 Bullous pemphigoid: Secondary | ICD-10-CM | POA: Diagnosis not present

## 2022-08-26 DIAGNOSIS — E782 Mixed hyperlipidemia: Secondary | ICD-10-CM | POA: Diagnosis not present

## 2022-08-26 DIAGNOSIS — I10 Essential (primary) hypertension: Secondary | ICD-10-CM | POA: Diagnosis not present

## 2022-10-23 DIAGNOSIS — Z936 Other artificial openings of urinary tract status: Secondary | ICD-10-CM | POA: Diagnosis not present

## 2022-10-23 DIAGNOSIS — C7911 Secondary malignant neoplasm of bladder: Secondary | ICD-10-CM | POA: Diagnosis not present

## 2022-10-29 DIAGNOSIS — M25562 Pain in left knee: Secondary | ICD-10-CM | POA: Diagnosis not present

## 2022-10-29 DIAGNOSIS — I10 Essential (primary) hypertension: Secondary | ICD-10-CM | POA: Diagnosis not present

## 2022-10-29 DIAGNOSIS — L120 Bullous pemphigoid: Secondary | ICD-10-CM | POA: Diagnosis not present

## 2022-10-30 DIAGNOSIS — Z79899 Other long term (current) drug therapy: Secondary | ICD-10-CM | POA: Diagnosis not present

## 2023-02-17 DIAGNOSIS — Z125 Encounter for screening for malignant neoplasm of prostate: Secondary | ICD-10-CM | POA: Diagnosis not present

## 2023-02-17 DIAGNOSIS — Z0000 Encounter for general adult medical examination without abnormal findings: Secondary | ICD-10-CM | POA: Diagnosis not present

## 2023-02-17 DIAGNOSIS — E782 Mixed hyperlipidemia: Secondary | ICD-10-CM | POA: Diagnosis not present

## 2023-02-17 DIAGNOSIS — I10 Essential (primary) hypertension: Secondary | ICD-10-CM | POA: Diagnosis not present

## 2023-02-23 DIAGNOSIS — Z936 Other artificial openings of urinary tract status: Secondary | ICD-10-CM | POA: Diagnosis not present

## 2023-02-23 DIAGNOSIS — C7911 Secondary malignant neoplasm of bladder: Secondary | ICD-10-CM | POA: Diagnosis not present

## 2023-02-24 DIAGNOSIS — I10 Essential (primary) hypertension: Secondary | ICD-10-CM | POA: Diagnosis not present

## 2023-02-24 DIAGNOSIS — Z8551 Personal history of malignant neoplasm of bladder: Secondary | ICD-10-CM | POA: Diagnosis not present

## 2023-02-24 DIAGNOSIS — I872 Venous insufficiency (chronic) (peripheral): Secondary | ICD-10-CM | POA: Diagnosis not present

## 2023-02-24 DIAGNOSIS — I2510 Atherosclerotic heart disease of native coronary artery without angina pectoris: Secondary | ICD-10-CM | POA: Diagnosis not present

## 2023-02-24 DIAGNOSIS — Z0000 Encounter for general adult medical examination without abnormal findings: Secondary | ICD-10-CM | POA: Diagnosis not present

## 2023-02-24 DIAGNOSIS — E782 Mixed hyperlipidemia: Secondary | ICD-10-CM | POA: Diagnosis not present

## 2023-02-24 DIAGNOSIS — L120 Bullous pemphigoid: Secondary | ICD-10-CM | POA: Diagnosis not present

## 2023-03-02 DIAGNOSIS — Z939 Artificial opening status, unspecified: Secondary | ICD-10-CM | POA: Diagnosis not present

## 2023-03-02 DIAGNOSIS — Z79899 Other long term (current) drug therapy: Secondary | ICD-10-CM | POA: Diagnosis not present

## 2023-03-02 DIAGNOSIS — L120 Bullous pemphigoid: Secondary | ICD-10-CM | POA: Diagnosis not present

## 2023-04-16 DIAGNOSIS — C672 Malignant neoplasm of lateral wall of bladder: Secondary | ICD-10-CM | POA: Diagnosis not present

## 2023-04-16 DIAGNOSIS — N1330 Unspecified hydronephrosis: Secondary | ICD-10-CM | POA: Diagnosis not present

## 2023-04-16 DIAGNOSIS — C689 Malignant neoplasm of urinary organ, unspecified: Secondary | ICD-10-CM | POA: Diagnosis not present

## 2023-04-16 DIAGNOSIS — C669 Malignant neoplasm of unspecified ureter: Secondary | ICD-10-CM | POA: Diagnosis not present

## 2023-05-20 DIAGNOSIS — Z79899 Other long term (current) drug therapy: Secondary | ICD-10-CM | POA: Diagnosis not present

## 2023-06-29 DIAGNOSIS — Z936 Other artificial openings of urinary tract status: Secondary | ICD-10-CM | POA: Diagnosis not present

## 2023-06-29 DIAGNOSIS — C7911 Secondary malignant neoplasm of bladder: Secondary | ICD-10-CM | POA: Diagnosis not present

## 2023-08-27 DIAGNOSIS — C672 Malignant neoplasm of lateral wall of bladder: Secondary | ICD-10-CM | POA: Diagnosis not present

## 2023-08-27 DIAGNOSIS — E782 Mixed hyperlipidemia: Secondary | ICD-10-CM | POA: Diagnosis not present

## 2023-08-27 DIAGNOSIS — R21 Rash and other nonspecific skin eruption: Secondary | ICD-10-CM | POA: Diagnosis not present

## 2023-08-27 DIAGNOSIS — I872 Venous insufficiency (chronic) (peripheral): Secondary | ICD-10-CM | POA: Diagnosis not present

## 2023-08-27 DIAGNOSIS — I10 Essential (primary) hypertension: Secondary | ICD-10-CM | POA: Diagnosis not present

## 2023-08-27 DIAGNOSIS — I2510 Atherosclerotic heart disease of native coronary artery without angina pectoris: Secondary | ICD-10-CM | POA: Diagnosis not present

## 2023-08-27 DIAGNOSIS — L120 Bullous pemphigoid: Secondary | ICD-10-CM | POA: Diagnosis not present

## 2023-08-28 ENCOUNTER — Encounter (HOSPITAL_COMMUNITY)
Admission: EM | Disposition: A | Discharge status: Home Health | Payer: Self-pay | Source: Home / Self Care | Attending: Cardiology

## 2023-08-28 ENCOUNTER — Other Ambulatory Visit: Payer: Self-pay

## 2023-08-28 ENCOUNTER — Emergency Department (HOSPITAL_COMMUNITY): Payer: Medicare HMO

## 2023-08-28 ENCOUNTER — Inpatient Hospital Stay (HOSPITAL_COMMUNITY)
Admission: EM | Admit: 2023-08-28 | Discharge: 2023-08-29 | DRG: 322 | Disposition: A | Discharge status: Home Health | Payer: Medicare HMO | Attending: Cardiology | Admitting: Cardiology

## 2023-08-28 DIAGNOSIS — Z7902 Long term (current) use of antithrombotics/antiplatelets: Secondary | ICD-10-CM | POA: Diagnosis not present

## 2023-08-28 DIAGNOSIS — Z87891 Personal history of nicotine dependence: Secondary | ICD-10-CM | POA: Diagnosis not present

## 2023-08-28 DIAGNOSIS — Z7982 Long term (current) use of aspirin: Secondary | ICD-10-CM | POA: Diagnosis not present

## 2023-08-28 DIAGNOSIS — Z8551 Personal history of malignant neoplasm of bladder: Secondary | ICD-10-CM

## 2023-08-28 DIAGNOSIS — R079 Chest pain, unspecified: Secondary | ICD-10-CM | POA: Diagnosis not present

## 2023-08-28 DIAGNOSIS — Z79899 Other long term (current) drug therapy: Secondary | ICD-10-CM

## 2023-08-28 DIAGNOSIS — R0789 Other chest pain: Secondary | ICD-10-CM | POA: Diagnosis not present

## 2023-08-28 DIAGNOSIS — I2102 ST elevation (STEMI) myocardial infarction involving left anterior descending coronary artery: Secondary | ICD-10-CM | POA: Diagnosis not present

## 2023-08-28 DIAGNOSIS — I213 ST elevation (STEMI) myocardial infarction of unspecified site: Secondary | ICD-10-CM | POA: Diagnosis present

## 2023-08-28 DIAGNOSIS — I252 Old myocardial infarction: Secondary | ICD-10-CM | POA: Diagnosis not present

## 2023-08-28 DIAGNOSIS — Z8249 Family history of ischemic heart disease and other diseases of the circulatory system: Secondary | ICD-10-CM

## 2023-08-28 DIAGNOSIS — I25110 Atherosclerotic heart disease of native coronary artery with unstable angina pectoris: Secondary | ICD-10-CM | POA: Diagnosis not present

## 2023-08-28 DIAGNOSIS — E7800 Pure hypercholesterolemia, unspecified: Secondary | ICD-10-CM | POA: Diagnosis present

## 2023-08-28 DIAGNOSIS — I2101 ST elevation (STEMI) myocardial infarction involving left main coronary artery: Principal | ICD-10-CM

## 2023-08-28 DIAGNOSIS — I959 Hypotension, unspecified: Secondary | ICD-10-CM | POA: Diagnosis not present

## 2023-08-28 DIAGNOSIS — T82855A Stenosis of coronary artery stent, initial encounter: Secondary | ICD-10-CM | POA: Diagnosis present

## 2023-08-28 DIAGNOSIS — Y831 Surgical operation with implant of artificial internal device as the cause of abnormal reaction of the patient, or of later complication, without mention of misadventure at the time of the procedure: Secondary | ICD-10-CM | POA: Diagnosis present

## 2023-08-28 DIAGNOSIS — I10 Essential (primary) hypertension: Secondary | ICD-10-CM | POA: Diagnosis not present

## 2023-08-28 DIAGNOSIS — I2510 Atherosclerotic heart disease of native coronary artery without angina pectoris: Secondary | ICD-10-CM | POA: Diagnosis not present

## 2023-08-28 DIAGNOSIS — Z888 Allergy status to other drugs, medicaments and biological substances status: Secondary | ICD-10-CM

## 2023-08-28 DIAGNOSIS — Z820 Family history of epilepsy and other diseases of the nervous system: Secondary | ICD-10-CM

## 2023-08-28 DIAGNOSIS — Z833 Family history of diabetes mellitus: Secondary | ICD-10-CM | POA: Diagnosis not present

## 2023-08-28 DIAGNOSIS — Z8042 Family history of malignant neoplasm of prostate: Secondary | ICD-10-CM | POA: Diagnosis not present

## 2023-08-28 DIAGNOSIS — I340 Nonrheumatic mitral (valve) insufficiency: Secondary | ICD-10-CM | POA: Diagnosis not present

## 2023-08-28 HISTORY — PX: CORONARY/GRAFT ACUTE MI REVASCULARIZATION: CATH118305

## 2023-08-28 LAB — CBC WITH DIFFERENTIAL/PLATELET
Abs Immature Granulocytes: 0.03 10*3/uL (ref 0.00–0.07)
Basophils Absolute: 0 10*3/uL (ref 0.0–0.1)
Basophils Relative: 1 %
Eosinophils Absolute: 0.1 10*3/uL (ref 0.0–0.5)
Eosinophils Relative: 2 %
HCT: 40.7 % (ref 39.0–52.0)
Hemoglobin: 13 g/dL (ref 13.0–17.0)
Immature Granulocytes: 1 %
Lymphocytes Relative: 9 %
Lymphs Abs: 0.6 10*3/uL — ABNORMAL LOW (ref 0.7–4.0)
MCH: 30.4 pg (ref 26.0–34.0)
MCHC: 31.9 g/dL (ref 30.0–36.0)
MCV: 95.3 fL (ref 80.0–100.0)
Monocytes Absolute: 0.9 10*3/uL (ref 0.1–1.0)
Monocytes Relative: 14 %
Neutro Abs: 4.6 10*3/uL (ref 1.7–7.7)
Neutrophils Relative %: 73 %
Platelets: 172 10*3/uL (ref 150–400)
RBC: 4.27 MIL/uL (ref 4.22–5.81)
RDW: 15.9 % — ABNORMAL HIGH (ref 11.5–15.5)
WBC: 6.2 10*3/uL (ref 4.0–10.5)
nRBC: 0 % (ref 0.0–0.2)

## 2023-08-28 LAB — LIPID PANEL
Cholesterol: 155 mg/dL (ref 0–200)
HDL: 51 mg/dL (ref >40)
LDL Cholesterol: 84 mg/dL (ref 0–99)
Total CHOL/HDL Ratio: 3 ratio
Triglycerides: 99 mg/dL (ref <150)
VLDL: 20 mg/dL (ref 0–40)

## 2023-08-28 LAB — COMPREHENSIVE METABOLIC PANEL
ALT: 35 U/L (ref 0–44)
AST: 51 U/L — ABNORMAL HIGH (ref 15–41)
Albumin: 3.6 g/dL (ref 3.5–5.0)
Alkaline Phosphatase: 109 U/L (ref 38–126)
Anion gap: 6 (ref 5–15)
BUN: 15 mg/dL (ref 8–23)
CO2: 23 mmol/L (ref 22–32)
Calcium: 9.2 mg/dL (ref 8.9–10.3)
Chloride: 110 mmol/L (ref 98–111)
Creatinine, Ser: 1.21 mg/dL (ref 0.61–1.24)
GFR, Estimated: 59 mL/min — ABNORMAL LOW (ref >60)
Glucose, Bld: 104 mg/dL — ABNORMAL HIGH (ref 70–99)
Potassium: 4 mmol/L (ref 3.5–5.1)
Sodium: 139 mmol/L (ref 135–145)
Total Bilirubin: 0.7 mg/dL (ref 0.3–1.2)
Total Protein: 6.3 g/dL — ABNORMAL LOW (ref 6.5–8.1)

## 2023-08-28 LAB — PROTIME-INR
INR: 1.1 (ref 0.8–1.2)
Prothrombin Time: 14 s (ref 11.4–15.2)

## 2023-08-28 LAB — APTT: aPTT: 31 s (ref 24–36)

## 2023-08-28 LAB — CG4 I-STAT (LACTIC ACID): Lactic Acid, Venous: 0.7 mmol/L (ref 0.5–1.9)

## 2023-08-28 LAB — POCT ACTIVATED CLOTTING TIME: Activated Clotting Time: 366 s

## 2023-08-28 LAB — TROPONIN I (HIGH SENSITIVITY): Troponin I (High Sensitivity): 1987 ng/L (ref <18)

## 2023-08-28 SURGERY — CORONARY/GRAFT ACUTE MI REVASCULARIZATION
Anesthesia: LOCAL

## 2023-08-28 MED ORDER — TICAGRELOR 90 MG PO TABS
ORAL_TABLET | ORAL | Status: DC | PRN
  Administered 2023-08-28: 180 mg via ORAL

## 2023-08-28 MED ORDER — HEPARIN SODIUM (PORCINE) 5000 UNIT/ML IJ SOLN
5000.0000 [IU] | Freq: Three times a day (TID) | INTRAMUSCULAR | Status: DC
  Administered 2023-08-28 – 2023-08-29 (×2): 5000 [IU] via SUBCUTANEOUS
  Filled 2023-08-28 (×2): qty 1

## 2023-08-28 MED ORDER — NITROGLYCERIN 0.4 MG SL SUBL
0.4000 mg | SUBLINGUAL_TABLET | SUBLINGUAL | Status: DC | PRN
  Administered 2023-08-29: 0.4 mg via SUBLINGUAL
  Filled 2023-08-28: qty 1

## 2023-08-28 MED ORDER — ROSUVASTATIN CALCIUM 20 MG PO TABS
20.0000 mg | ORAL_TABLET | Freq: Every day | ORAL | Status: DC
  Administered 2023-08-29: 20 mg via ORAL
  Filled 2023-08-28: qty 1

## 2023-08-28 MED ORDER — SODIUM CHLORIDE 0.9% FLUSH
3.0000 mL | Freq: Two times a day (BID) | INTRAVENOUS | Status: DC
  Administered 2023-08-29: 3 mL via INTRAVENOUS

## 2023-08-28 MED ORDER — ACETAMINOPHEN 325 MG PO TABS: 650.0000 mg | ORAL_TABLET | ORAL | Status: DC | PRN

## 2023-08-28 MED ORDER — LIDOCAINE HCL (PF) 1 % IJ SOLN
INTRAMUSCULAR | Status: DC | PRN
  Administered 2023-08-28: 2 mL via INTRADERMAL

## 2023-08-28 MED ORDER — VERAPAMIL HCL 2.5 MG/ML IV SOLN
INTRAVENOUS | Status: DC | PRN
  Administered 2023-08-28: 5 mL via INTRA_ARTERIAL

## 2023-08-28 MED ORDER — ORAL CARE MOUTH RINSE: 15.0000 mL | OROMUCOSAL | Status: DC | PRN

## 2023-08-28 MED ORDER — HEPARIN SODIUM (PORCINE) 1000 UNIT/ML IJ SOLN
INTRAMUSCULAR | Status: DC | PRN
  Administered 2023-08-28: 3000 [IU] via INTRAVENOUS
  Administered 2023-08-28: 6000 [IU] via INTRAVENOUS

## 2023-08-28 MED ORDER — ONDANSETRON HCL 4 MG/2ML IJ SOLN
4.0000 mg | Freq: Four times a day (QID) | INTRAMUSCULAR | Status: DC | PRN
  Filled 2023-08-28: qty 2

## 2023-08-28 MED ORDER — SODIUM CHLORIDE 0.9 % WEIGHT BASED INFUSION
1.0000 mL/kg/h | INTRAVENOUS | Status: AC
  Administered 2023-08-28: 1 mL/kg/h via INTRAVENOUS

## 2023-08-28 MED ORDER — HEPARIN (PORCINE) IN NACL 1000-0.9 UT/500ML-% IV SOLN
INTRAVENOUS | Status: DC | PRN
  Administered 2023-08-28 (×2): 500 mL

## 2023-08-28 MED ORDER — SODIUM CHLORIDE 0.9 % IV SOLN: INTRAVENOUS | Status: DC

## 2023-08-28 MED ORDER — ASPIRIN 81 MG PO CHEW
81.0000 mg | CHEWABLE_TABLET | Freq: Every day | ORAL | Status: DC
  Administered 2023-08-29: 81 mg via ORAL
  Filled 2023-08-28: qty 1

## 2023-08-28 MED ORDER — SODIUM CHLORIDE 0.9 % IV SOLN: 250.0000 mL | INTRAVENOUS | Status: DC | PRN

## 2023-08-28 MED ORDER — TICAGRELOR 90 MG PO TABS
90.0000 mg | ORAL_TABLET | Freq: Two times a day (BID) | ORAL | Status: DC
  Administered 2023-08-29: 90 mg via ORAL
  Filled 2023-08-28: qty 1

## 2023-08-28 MED ORDER — METOPROLOL SUCCINATE ER 25 MG PO TB24
25.0000 mg | ORAL_TABLET | Freq: Every day | ORAL | Status: DC
  Administered 2023-08-29: 25 mg via ORAL
  Filled 2023-08-28: qty 1

## 2023-08-28 MED ORDER — FENTANYL CITRATE (PF) 100 MCG/2ML IJ SOLN
INTRAMUSCULAR | Status: DC | PRN
  Administered 2023-08-28: 25 ug via INTRAVENOUS

## 2023-08-28 MED ORDER — LOSARTAN POTASSIUM 25 MG PO TABS
25.0000 mg | ORAL_TABLET | Freq: Every day | ORAL | Status: DC
  Administered 2023-08-29: 25 mg via ORAL
  Filled 2023-08-28: qty 1

## 2023-08-28 MED ORDER — IOHEXOL 350 MG/ML SOLN
INTRAVENOUS | Status: DC | PRN
  Administered 2023-08-28: 60 mL

## 2023-08-28 MED ORDER — HEPARIN SODIUM (PORCINE) 5000 UNIT/ML IJ SOLN
60.0000 [IU]/kg | Freq: Once | INTRAMUSCULAR | Status: AC
  Administered 2023-08-28: 3850 [IU] via INTRAVENOUS
  Filled 2023-08-28: qty 1

## 2023-08-28 MED ORDER — ASPIRIN 81 MG PO TABS: 81.0000 mg | ORAL_TABLET | Freq: Every day | ORAL | Status: DC

## 2023-08-28 MED ORDER — ASPIRIN 81 MG PO CHEW
324.0000 mg | CHEWABLE_TABLET | Freq: Once | ORAL | Status: AC
  Administered 2023-08-28: 324 mg via ORAL
  Filled 2023-08-28: qty 4

## 2023-08-28 MED ORDER — CHLORHEXIDINE GLUCONATE CLOTH 2 % EX PADS
6.0000 | MEDICATED_PAD | Freq: Every day | CUTANEOUS | Status: DC
  Administered 2023-08-29: 6 via TOPICAL

## 2023-08-28 MED ORDER — SODIUM CHLORIDE 0.9% FLUSH: 3.0000 mL | INTRAVENOUS | Status: DC | PRN

## 2023-08-28 SURGICAL SUPPLY — 18 items
BALLN EMERGE MR 2.5X15 (BALLOONS) ×1 IMPLANT
BALLN ~~LOC~~ EMERGE MR 3.5X30 (BALLOONS) ×1 IMPLANT
BALLOON EMERGE MR 2.5X15 (BALLOONS) IMPLANT
BALLOON ~~LOC~~ EMERGE MR 3.5X30 (BALLOONS) IMPLANT
CATH INFINITI AMBI 5FR TG (CATHETERS) IMPLANT
CATH VISTA GUIDE 6FR XB3.5 (CATHETERS) IMPLANT
DEVICE RAD COMP TR BAND LRG (VASCULAR PRODUCTS) IMPLANT
ELECT DEFIB PAD ADLT CADENCE (PAD) IMPLANT
GLIDESHEATH SLEND SS 6F .021 (SHEATH) IMPLANT
GUIDEWIRE INQWIRE 1.5J.035X260 (WIRE) IMPLANT
INQWIRE 1.5J .035X260CM (WIRE) ×1 IMPLANT
KIT ENCORE 26 ADVANTAGE (KITS) IMPLANT
PACK CARDIAC CATHETERIZATION (CUSTOM PROCEDURE TRAY) ×1 IMPLANT
SET ATX-X65L (MISCELLANEOUS) IMPLANT
STENT SYNERGY XD 3.0X48 (Permanent Stent) IMPLANT
SYNERGY XD 3.0X48 (Permanent Stent) ×1 IMPLANT
TUBING CIL FLEX 10 FLL-RA (TUBING) IMPLANT
WIRE COUGAR XT STRL 190CM (WIRE) IMPLANT

## 2023-08-28 NOTE — ED Provider Notes (Signed)
[place] EMERGENCY DEPARTMENT AT [facility] Provider Note   CSN: [id number] Arrival date & time: 08/28/23  2032     History {Add pertinent medical, surgical, social history, OB history to HPI:1} Chief Complaint  Patient presents with   Chest Pain    Terry [name] is a 87 y.o. male.  Patient has a history of hypertension.  He is with history of coronary artery disease and has a stent placed.  He started having chest pain yesterday off-and-on and earlier today.  Around 8 PM he started having continuous severe pain.  Some shortness of breath  The history is provided by the patient and medical records. No language interpreter was used.  Chest Pain Pain location:  L chest Pain quality: aching   Pain radiates to:  L arm Pain severity:  Severe Timing:  Constant Progression:  Worsening Chronicity:  Recurrent Context: not breathing   Relieved by:  Nothing Worsened by:  Nothing Ineffective treatments:  None tried Associated symptoms: no abdominal pain, no back pain, no cough, no fatigue and no headache        Home Medications Prior to Admission medications   Medication Sig Start Date End Date Taking? Authorizing Provider  acetaminophen (TYLENOL) 325 MG tablet Take 650 mg by mouth every 6 (six) hours as needed.    [provider]  aspirin 81 MG tablet Take 1 tablet by mouth daily.     [provider]  cetirizine (ZYRTEC) 10 MG tablet Take 10 mg by mouth daily.    [provider]  diphenhydramine-acetaminophen (TYLENOL PM) 25-500 MG TABS tablet Take 1 tablet by mouth at bedtime as needed.    [provider]  Multiple Vitamin (MULTI-VITAMINS) TABS Take by mouth.    [provider]  neomycin-bacitracin-polymyxin (NEOSPORIN) [phone] ointment Apply 1 application topically as needed.    [provider]  Ostomy Supplies (SENSURA DRAINABLE) Pouch MISC Ostomy supplies as needed. Diagnosis: Bladder Cancer (C67.9)  12/18/15   [provider]  pravastatin (PRAVACHOL) 20 MG tablet TAKE 1 TABLET EVERY DAY 08/22/21   Chrismon, Jodell Cipro, PA-C  Skin Protectants, Misc. (CAVILON NO STING BARRIER FILM EX) Apply topically as needed.    [provider]  triamcinolone cream (KENALOG) 0.1 % Apply 1 application topically 2 (two) times daily.  07/18/17   [provider]      Allergies    Triprolidine-pseudoephedrine, Triprolidine-pseudoephedrine, Dapsone, Sudafed pe cold-cough  [phenylephrine-dm-gg-apap], and Actifed cold-allergy  [chlorpheniramine-phenylephrine]    Review of Systems   Review of Systems  Constitutional:  Negative for appetite change and fatigue.  HENT:  Negative for congestion, ear discharge and sinus pressure.   Eyes:  Negative for discharge.  Respiratory:  Negative for cough.   Cardiovascular:  Positive for chest pain.  Gastrointestinal:  Negative for abdominal pain and diarrhea.  Genitourinary:  Negative for frequency and hematuria.  Musculoskeletal:  Negative for back pain.  Skin:  Negative for rash.  Neurological:  Negative for seizures and headaches.  Psychiatric/Behavioral:  Negative for hallucinations.     Physical Exam Updated Vital Signs BP 132/79   Pulse 84   Temp 98.2 F (36.8 C) (Oral)   Resp 18   Ht 5' 5.24" (1.657 m)   Wt 53.7 kg   SpO2 100%   BMI 19.56 kg/m  Physical Exam Vitals reviewed.  Constitutional:      Appearance: He is well-developed. He is ill-appearing.  HENT:     Head: Normocephalic.  Nose: Nose normal.  Eyes:     General: No scleral icterus.    Conjunctiva/sclera: Conjunctivae normal.  Neck:     Thyroid: No thyromegaly.  Cardiovascular:     Rate and Rhythm: Normal rate and regular rhythm.     Heart sounds: No murmur heard.    No friction rub. No gallop.  Pulmonary:     Breath sounds: No stridor. No wheezing or rales.  Chest:     Chest wall: No tenderness.  Abdominal:     General: There is no distension.      Tenderness: There is no abdominal tenderness. There is no rebound.  Musculoskeletal:        General: Normal range of motion.     Cervical back: Neck supple.  Lymphadenopathy:     Cervical: No cervical adenopathy.  Skin:    Findings: No erythema or rash.  Neurological:     Mental Status: He is alert and oriented to person, place, and time.     Motor: No abnormal muscle tone.     Coordination: Coordination normal.  Psychiatric:        Behavior: Behavior normal.     ED Results / Procedures / Treatments   Labs (all labs ordered are listed, but only abnormal results are displayed) Labs Reviewed  HEMOGLOBIN A1C  CBC WITH DIFFERENTIAL/PLATELET  PROTIME-INR  APTT  COMPREHENSIVE METABOLIC PANEL  LIPID PANEL  I-STAT CG4 LACTIC ACID, ED  TROPONIN I (HIGH SENSITIVITY)    EKG None  Radiology No results found.  Procedures Procedures  {Document cardiac monitor, telemetry assessment procedure when appropriate:1}  Medications Ordered in ED Medications  0.9 %  sodium chloride infusion (has no administration in time range)  aspirin chewable tablet 324 mg (324 mg Oral Given 08/28/23 2059)  heparin injection 3,850 Units (3,850 Units Intravenous Given 08/28/23 2059)    ED Course/ Medical Decision Making/ A&P  CRITICAL CARE Performed by: Bethann Berkshire Total critical care time: 35 minutes Critical care time was exclusive of separately billable procedures and treating other patients. Critical care was necessary to treat or prevent imminent or life-threatening deterioration. Critical care was time spent personally by me on the following activities: development of treatment plan with patient and/or surrogate as well as nursing, discussions with consultants, evaluation of patient's response to treatment, examination of patient, obtaining history from patient or surrogate, ordering and performing treatments and interventions, ordering and review of laboratory studies, ordering and review of  radiographic studies, pulse oximetry and re-evaluation of patient's condition.   EKG shows acute STEMI.  I called a STEMI protocol and spoke with Dr. Herbie Baltimore and he has accepted the patient {   Click here for ABCD2, HEART and other calculatorsREFRESH Note before signing :1}                              Medical Decision Making Amount and/or Complexity of Data Reviewed Labs: ordered. Radiology: ordered.  Risk OTC drugs. Prescription drug management.   Patient with a STEMI.  He is being transferred to the cardiac Cath Lab at [facility] and Dr. Herbie Baltimore will take care of  {Document critical care time when appropriate:1} {Document review of labs and clinical decision tools ie heart score, Chads2Vasc2 etc:1}  {Document your independent review of radiology images, and any outside records:1} {Document your discussion with family members, caretakers, and with consultants:1} {Document social determinants of health affecting pt's care:1} {Document your decision making why or  why not admission, treatments were needed:1} Final Clinical Impression(s) / ED Diagnoses Final diagnoses:  ST elevation myocardial infarction involving left main coronary artery (HCC)    Rx / DC Orders ED Discharge Orders     None

## 2023-08-28 NOTE — Progress Notes (Signed)
   08/28/23 2200  Spiritual Encounters  Type of Visit Initial  Care provided to: Patient  Conversation partners present during encounter Nurse  Referral source Code page  Reason for visit Code  OnCall Visit Yes   Ch responded to code page. There was no family present. Pt is en route to Cath Lab. No follow-up needed at this time.

## 2023-08-28 NOTE — ED Triage Notes (Signed)
C/o centralized chest pain radiating to right shoulder x1 day.  Stemi on EKG in triage. MD aware and placed in ED room 1.  Denies headache, dizziness, sob.  Ambulatory to triage.

## 2023-08-28 NOTE — ED Notes (Signed)
Pt off unit by [facility] for transport to [facility]

## 2023-08-28 NOTE — ED Notes (Signed)
Call placed to Cath Lab to attempt report, no answer

## 2023-08-28 NOTE — Discharge Instructions (Signed)
Transfer patient to the cardiac Cath Lab at [facility].  Dr. Herbie Baltimore is accepting doctor    Information about your medication: Brilinta (anti-platelet agent)  Generic Name (Brand): ticagrelor (Brilinta), twice daily medication  PURPOSE: You are taking this medication along with aspirin to lower your chance of having a heart attack, stroke, or blood clots in your heart stent. These can be fatal. Brilinta and aspirin help prevent platelets from sticking together and forming a clot that can block an artery or your stent.   Common SIDE EFFECTS you may experience include: bruising or bleeding more easily, shortness of breath  Do not stop taking BRILINTA without talking to the doctor who prescribes it for you. People who are treated with a stent and stop taking Brilinta too soon, have a higher risk of getting a blood clot in the stent, having a heart attack, or dying. If you stop Brilinta because of bleeding, or for other reasons, your risk of a heart attack or stroke may increase.   Avoid taking NSAID agents or anti-inflammatory medications such as ibuprofen, naproxen given increased bleed risk with Brilinta - can use acetaminophen (Tylenol) if needed for pain.  Tell all of your doctors and dentists that you are taking Brilinta. They should talk to the doctor who prescribed Brilinta for you before you have any surgery or invasive procedure.   Contact your health care provider if you experience: severe or uncontrollable bleeding, pink/red/brown urine, vomiting blood or vomit that looks like "coffee grounds", red or black stools (looks like tar), coughing up blood or blood clots ----------------------------------------------------------------------------------------------------------------------   **Make sure to always take brilinta and aspirin until told differently by cardiology - if cost ever becomes an issue, please call office to help with options for reducing cost**

## 2023-08-28 NOTE — ED Notes (Addendum)
CODE STEMI CALLED. [facility] called for  Emergent  transport

## 2023-08-28 NOTE — H&P (Signed)
Cardiology Admission History and Physical   Patient ID: Terry [name] [facility] MRN: [id number]; DOB: 1936-10-16   Admission date: 08/28/2023  PCP:  [facility]   [facility] Providers Cardiologist:  None        Chief Complaint:  chest pain  Patient Profile:   Terry [name] is a 87 y.o. male with bladder cancer, HTN, HLD who is being seen 08/28/2023 for the evaluation of chest pain and STEMI.  History of Present Illness:   Terry [name] is an 87 year old gentleman with a history of bladder cancer, hypertension, hyperlipidemia who initially presented to the emergency department with chest pain that he has been having ongoingly for the last 1 day.  On initial EKG outside ED he was found to have anterior ST elevation.  He has had ongoing chest pressure and shoulder pain but no other symptoms.  Code STEMI was activated he was transported immediately to [facility] for acute intervention.  On arrival to our Cath Lab, he was taken to lab by Dr. Jacinto Halim.  Found to have proximal 95% LAD stenosis and 80% RCA stenosis ISR. Immediate intervention to LAD was performed.    Past Medical History:  Diagnosis Date   Cancer [facility])    bladder cancer   Hyperlipidemia    Hypertension    Kidney stones    Kidney stones    Nephrolithiasis    Status post surgical removal and fulguration of bladder neoplasm     Past Surgical History:  Procedure Laterality Date   BLADDER SURGERY  06/2015   deviated nose septum surgery  1970   EP IMPLANTABLE DEVICE     HERNIA REPAIR  1980   single septum port     stomach ulcer surgery  1994   vascular stent  2006     Medications Prior to Admission: Prior to Admission medications   Medication Sig Start Date End Date Taking? Authorizing Provider  acetaminophen (TYLENOL) 325 MG tablet Take 650 mg by mouth every 6 (six) hours as needed.    [provider]  aspirin 81 MG tablet Take 1 tablet by mouth daily.     [provider]  cetirizine (ZYRTEC) 10 MG tablet Take 10 mg by mouth daily.    [provider]  diphenhydramine-acetaminophen (TYLENOL PM) 25-500 MG TABS tablet Take 1 tablet by mouth at bedtime as needed.    [provider]  Multiple Vitamin (MULTI-VITAMINS) TABS Take by mouth.    [provider]  neomycin-bacitracin-polymyxin (NEOSPORIN) [phone] ointment Apply 1 application topically as needed.    [provider]  Ostomy Supplies (SENSURA DRAINABLE) Pouch MISC Ostomy supplies as needed. Diagnosis: Bladder Cancer (C67.9) 12/18/15   [provider]  pravastatin (PRAVACHOL) 20 MG tablet TAKE 1 TABLET EVERY DAY 08/22/21   Chrismon, Jodell Cipro, PA-C  Skin Protectants, Misc. (CAVILON NO STING BARRIER FILM EX) Apply topically as needed.    [provider]  triamcinolone cream (KENALOG) 0.1 % Apply 1 application topically 2 (two) times daily.  07/18/17   [provider]     Allergies:    Allergies  Allergen Reactions   Triprolidine-Pseudoephedrine Rash and Hives   Triprolidine-Pseudoephedrine Hives and Rash   Dapsone Other (See Comments)    Methemoglobinemia   Other reaction(s): Unknown  Methemoglobinemia   Methemoglobinemia   Methemoglobinemia   Other reaction(s): Unknown  Methemoglobinemia   Sudafed Pe Cold-Cough  [Phenylephrine-Dm-Gg-Apap] Hives   Actifed Cold-Allergy  [Chlorpheniramine-Phenylephrine] Rash    Social  History:   Social History   Socioeconomic History   Marital status: Widowed    Spouse name: Not on file   Number of children: 3   Years of education: Not on file   Highest education level: High school graduate  Occupational History   Occupation: retired  Tobacco Use   Smoking status: Former    Current packs/day: 0.00    Average packs/day: 1.5 packs/day for 50.0 years (75.0 ttl pk-yrs)    Types: Cigarettes    Start date: 06/19/1955    Quit date: 06/18/2005    Years since quitting: 18.2   Smokeless tobacco:  Former    Types: Associate Professor status: Never Used  Substance and Sexual Activity   Alcohol use: No    Alcohol/week: 0.0 standard drinks of alcohol   Drug use: No   Sexual activity: Not on file  Other Topics Concern   Not on file  Social History Narrative   Not on file   Social Determinants of Health   Financial Resource Strain: Low Risk  (08/27/2023)   Received from [facility]   Overall Financial Resource Strain (CARDIA)    Difficulty of Paying Living Expenses: Not hard at all  Food Insecurity: No Food Insecurity (08/27/2023)   Received from [facility]   Hunger Vital Sign    Worried About Running Out of Food in the Last Year: Never true    Ran Out of Food in the Last Year: Never true  Transportation Needs: No Transportation Needs (08/27/2023)   Received from [facility]   PRAPARE - Transportation    In the past 12 months, has lack of transportation kept you from medical appointments or from getting medications?: No    Lack of Transportation (Non-Medical): No  Physical Activity: Inactive (02/01/2020)   Exercise Vital Sign    Days of Exercise per Week: 0 days    Minutes of Exercise per Session: 0 min  Stress: No Stress Concern Present (02/01/2020)   [employer] of Occupational Health - Occupational Stress Questionnaire    Feeling of Stress : Not at all  Social Connections: Moderately Integrated (02/01/2020)   Social Connection and Isolation Panel [NHANES]    Frequency of Communication with Friends and Family: More than three times a week    Frequency of Social Gatherings with Friends and Family: More than three times a week    Attends Religious Services: More than 4 times per year    Active Member of [employer] or Organizations: Yes    Attends Banker Meetings: More than 4 times per year    Marital Status: Widowed  Intimate Partner Violence: Not At Risk (02/01/2020)   Humiliation, Afraid, Rape, and  Kick questionnaire    Fear of Current or Ex-Partner: No    Emotionally Abused: No    Physically Abused: No    Sexually Abused: No    Family History:   The patient's family history includes Alzheimer's disease in his father; Diabetes in his mother; Heart disease in his mother; Hip fracture in his father; Hypertension in his father; Prostate cancer in his paternal uncle.    ROS:  Please see the history of present illness.  All other ROS reviewed and negative.     Physical Exam/Data:   Vitals:   08/28/23 2043 08/28/23 2053 08/28/23 2100 08/28/23 2155  BP: 132/79  130/86   Pulse: 84  92   Resp: 18  (!) 23  Temp: 98.2 F (36.8 C)     TempSrc: Oral     SpO2: 100%  99% 100%  Weight:  64 kg 53.7 kg   Height:   5' 5.24" (1.657 m)    No intake or output data in the 24 hours ending 08/28/23 2233    08/28/2023    9:00 PM 08/28/2023    8:53 PM 11/12/2020    9:56 AM  Last 3 Weights  Weight (lbs) 118 lb 6.2 oz 141 lb 1.5 oz 142 lb  Weight (kg) 53.7 kg 64 kg 64.411 kg     Body mass index is 19.56 kg/m.  General:   no acute distressjust mild pain  HEENT: normal Neck: no JVD Vascular: No carotid bruits; Distal pulses 2+ bilaterally   Cardiac:  normal S1, S2; RRR; no murmur  Lungs:  clear to auscultation bilaterally, no wheezing, rhonchi or rales  Abd: soft, nontender, no hepatomegaly  Ext: no edema Musculoskeletal:  No deformities, BUE and BLE strength normal and equal Skin: warm and dry  Neuro:  CNs 2-12 intact, no focal abnormalities noted Psych:  Normal affect    EKG:  The ECG that was done  was personally reviewed and demonstrates anterior STEMI  Relevant CV Studies: none  Laboratory Data:  High Sensitivity Troponin:   Recent Labs  Lab 08/28/23 2050  TROPONINIHS 1,987*      Chemistry Recent Labs  Lab 08/28/23 2050  NA 139  K 4.0  CL 110  CO2 23  GLUCOSE 104*  BUN 15  CREATININE 1.21  CALCIUM 9.2  GFRNONAA 59*  ANIONGAP 6    Recent Labs  Lab  08/28/23 2050  PROT 6.3*  ALBUMIN 3.6  AST 51*  ALT 35  ALKPHOS 109  BILITOT 0.7   Lipids  Recent Labs  Lab 08/28/23 2050  CHOL 155  TRIG 99  HDL 51  LDLCALC 84  CHOLHDL 3.0   Hematology Recent Labs  Lab 08/28/23 2050  WBC 6.2  RBC 4.27  HGB 13.0  HCT 40.7  MCV 95.3  MCH 30.4  MCHC 31.9  RDW 15.9*  PLT 172   Thyroid No results for input(s): "TSH", "FREET4" in the last 168 hours. BNPNo results for input(s): "BNP", "PROBNP" in the last 168 hours.  DDimer No results for input(s): "DDIMER" in the last 168 hours.   Radiology/Studies:  No results found.   Assessment and Plan:   STEMI involved proximal LAD. Now s/p 3.0x48 mm DES post dilated to 3.39mm proximally  - echo ordered for tomorrow - heparin for anticoagulation - loaded 325 mg ASA; continue 81 mg daily  - loaded ticagrelor 180mg , continue 90 mg bid  - start high intensity statin with rosuvastatin  20 mg; check lipid panel and LP(a) - beta blocker when stable - plan to start ACEi/ARB post cath - will check CBC, CMP, INR, hemoglobin A1c, tsh/FT4 - referral for cardiac rehab - admit for CICU; strict I&Os; daily weights      Risk Assessment/Risk Scores:    TIMI Risk Score for ST  Elevation MI:   The patient's TIMI risk score is 8, which indicates a 26.8% risk of all cause mortality at 30 days.       Code Status: Full Code  Severity of Illness: The appropriate patient status for this patient is INPATIENT. Inpatient status is judged to be reasonable and necessary in order to provide the required intensity of service to ensure the patient's safety. The patient's presenting symptoms, physical exam findings,  and initial radiographic and laboratory data in the context of their chronic comorbidities is felt to place them at high risk for further clinical deterioration. Furthermore, it is not anticipated that the patient will be medically stable for discharge from the hospital within 2 midnights of  admission.   * I certify that at the point of admission it is my clinical judgment that the patient will require inpatient hospital care spanning beyond 2 midnights from the point of admission due to high intensity of service, high risk for further deterioration and high frequency of surveillance required.*   For questions or updates, please contact [facility] Please consult [email or web address] for contact info under     Signed, Joellen Jersey, MD  08/28/2023 10:33 PM

## 2023-08-29 ENCOUNTER — Other Ambulatory Visit (HOSPITAL_COMMUNITY): Payer: Self-pay

## 2023-08-29 ENCOUNTER — Inpatient Hospital Stay (HOSPITAL_COMMUNITY): Payer: Medicare HMO

## 2023-08-29 ENCOUNTER — Telehealth: Payer: Self-pay | Admitting: Physician Assistant

## 2023-08-29 LAB — BASIC METABOLIC PANEL
Anion gap: 5 (ref 5–15)
BUN: 13 mg/dL (ref 8–23)
CO2: 24 mmol/L (ref 22–32)
Calcium: 8.9 mg/dL (ref 8.9–10.3)
Chloride: 112 mmol/L — ABNORMAL HIGH (ref 98–111)
Creatinine, Ser: 1.1 mg/dL (ref 0.61–1.24)
GFR, Estimated: >60 mL/min (ref >60)
Glucose, Bld: 111 mg/dL — ABNORMAL HIGH (ref 70–99)
Potassium: 4.2 mmol/L (ref 3.5–5.1)
Sodium: 141 mmol/L (ref 135–145)

## 2023-08-29 LAB — CBC
HCT: 39 % (ref 39.0–52.0)
Hemoglobin: 12.8 g/dL — ABNORMAL LOW (ref 13.0–17.0)
MCH: 31.2 pg (ref 26.0–34.0)
MCHC: 32.8 g/dL (ref 30.0–36.0)
MCV: 95.1 fL (ref 80.0–100.0)
Platelets: 157 10*3/uL (ref 150–400)
RBC: 4.1 MIL/uL — ABNORMAL LOW (ref 4.22–5.81)
RDW: 15.9 % — ABNORMAL HIGH (ref 11.5–15.5)
WBC: 8 10*3/uL (ref 4.0–10.5)
nRBC: 0 % (ref 0.0–0.2)

## 2023-08-29 LAB — ECHOCARDIOGRAM COMPLETE
Area-P 1/2: 4.31 cm2
Height: 65.236 in
S' Lateral: 2.1 cm
Weight: 1894.19 [oz_av]

## 2023-08-29 LAB — MRSA NEXT GEN BY PCR, NASAL: MRSA by PCR Next Gen: NOT DETECTED

## 2023-08-29 MED ORDER — LOSARTAN POTASSIUM 25 MG PO TABS
25.0000 mg | ORAL_TABLET | Freq: Every day | ORAL | 1 refills | Status: DC
  Filled 2023-08-29: qty 90, 90d supply, fill #0

## 2023-08-29 MED ORDER — LOSARTAN POTASSIUM 25 MG PO TABS: 25.0000 mg | ORAL_TABLET | Freq: Every day | ORAL | 1 refills | Status: DC

## 2023-08-29 MED ORDER — ONDANSETRON HCL 4 MG PO TABS: 4.0000 mg | ORAL_TABLET | Freq: Every day | ORAL | 0 refills | Status: DC | PRN

## 2023-08-29 MED ORDER — METOPROLOL SUCCINATE ER 25 MG PO TB24
25.0000 mg | ORAL_TABLET | Freq: Every day | ORAL | 1 refills | Status: DC
  Filled 2023-08-29: qty 90, 90d supply, fill #0

## 2023-08-29 MED ORDER — TICAGRELOR 90 MG PO TABS
90.0000 mg | ORAL_TABLET | Freq: Two times a day (BID) | ORAL | 3 refills | Status: DC
Start: 2023-08-29 — End: 2023-09-18
  Filled 2023-08-29: qty 60, 30d supply, fill #0

## 2023-08-29 MED ORDER — TICAGRELOR 90 MG PO TABS: 90.0000 mg | ORAL_TABLET | Freq: Two times a day (BID) | ORAL | 3 refills | Status: DC

## 2023-08-29 MED ORDER — NITROGLYCERIN 0.4 MG SL SUBL
0.4000 mg | SUBLINGUAL_TABLET | SUBLINGUAL | 1 refills | Status: DC | PRN
  Filled 2023-08-29: qty 25, 8d supply, fill #0

## 2023-08-29 MED ORDER — METOPROLOL SUCCINATE ER 25 MG PO TB24: 25.0000 mg | ORAL_TABLET | Freq: Every day | ORAL | 1 refills | Status: DC

## 2023-08-29 MED ORDER — NITROGLYCERIN 0.4 MG SL SUBL: 0.4000 mg | SUBLINGUAL_TABLET | SUBLINGUAL | 1 refills | Status: DC | PRN

## 2023-08-29 MED ORDER — ONDANSETRON HCL 4 MG PO TABS
4.0000 mg | ORAL_TABLET | Freq: Every day | ORAL | 0 refills | Status: DC | PRN
  Filled 2023-08-29: qty 12, 12d supply, fill #0

## 2023-08-29 MED ORDER — TICAGRELOR 90 MG PO TABS: 90.0000 mg | ORAL_TABLET | Freq: Two times a day (BID) | ORAL | 0 refills | Status: DC

## 2023-08-29 MED ORDER — ROSUVASTATIN CALCIUM 20 MG PO TABS: 20.0000 mg | ORAL_TABLET | Freq: Every day | ORAL | 2 refills | Status: DC

## 2023-08-29 MED ORDER — ROSUVASTATIN CALCIUM 20 MG PO TABS
20.0000 mg | ORAL_TABLET | Freq: Every day | ORAL | 2 refills | Status: DC
  Filled 2023-08-29: qty 30, 30d supply, fill #0

## 2023-08-29 NOTE — Progress Notes (Signed)
CARDIAC REHAB PHASE I   Pt sitting in bed eating lunch. Educated patient and 2 daughters on MI, MI book provided. Reviewed topics of post cath precautions and restrictions, angina and nitroglycerin use, exercise with exercise recommendations provided, nutrition/heart healthy diet, risk factors, medication and follow up compliance and introduced patient to cardiac rehab phase 2. All questions reviewed and answered and pt and 2 daughters understand without assistance. Pt declined to ambulate at this time. RN encouraged ambulation after lunch and post discharge.   [id number] Essie Hart, RN, BSN 08/29/2023

## 2023-08-29 NOTE — TOC Transition Note (Signed)
Transition of Care [facility]) - CM/SW Discharge Note   Patient Details  Name: Terry [name] [facility] MRN: [id number] Date of Birth: 01-19-1936  [facility] CM/SW Contact:  Lawerance Sabal, RN Phone Number: 08/29/2023, 10:42 AM   Clinical Narrative:     Sherron Monday w patient and daughters at bedside.  Provided with Brilinta coupon. Discussed support after DC and decided that [facility] RN for medication management would be helpful.  No preference for [facility] agency.  Referral made to [facility] and accepted.  Patient may be staying w daughter in [place] for a bit after DC   Final next level of care: Home w Home Health Services Barriers to Discharge: No Barriers Identified   Patient Goals and CMS Choice CMS Medicare.gov Compare Post Acute Care list provided to:: Other (Comment Required) Choice offered to / list presented to : Adult Children  Discharge Placement                         Discharge Plan and Services Additional resources added to the After Visit Summary for                  DME Arranged: N/A         HH Arranged: RN HH Agency: [facility] Date [facility] Agency Contacted: 08/29/23 Time HH Agency Contacted: 1042 Representative spoke with at [facility] Agency: Kandee Keen  Social Determinants of Health (SDOH) Interventions SDOH Screenings   Food Insecurity: No Food Insecurity (08/27/2023)   Received from [facility]  Housing: Low Risk  (02/01/2020)  Transportation Needs: No Transportation Needs (08/27/2023)   Received from [facility]  Utilities: Not At Risk (08/27/2023)   Received from [facility]  Alcohol Screen: Low Risk  (08/26/2019)  Depression (PHQ2-9): Low Risk  (02/01/2020)  Financial Resource Strain: Low Risk  (08/27/2023)   Received from [facility]  Physical Activity: Inactive (02/01/2020)  Social Connections: Moderately Integrated (02/01/2020)  Stress: No Stress Concern Present (02/01/2020)   Tobacco Use: Medium Risk (08/27/2023)   Received from [facility]     Readmission Risk Interventions     No data to display

## 2023-08-29 NOTE — Discharge Summary (Addendum)
Physician Discharge Summary  Patient ID: Henrry Chenault [facility] MRN: [id number] DOB/AGE: 85-Jan-1937 85 y.o. [facility]   Admit date: 08/28/2023 Discharge date: 08/29/2023  Primary Discharge Diagnosis Acute anterolateral STEMI CAD with unstable angina pectoris PTCA status, history of RCA stent remote Primary hypertension Hypercholesterolemia   Significant Diagnostic Studies:  EKG 08/28/2023: Normal sinus rhythm at rate of 86 bpm, left anterior fascicular block.  Anteroseptal STEMI.  EKG 08/29/2023: Normal sinus rhythm at rate of 80 bpm, left intrafascicular block.  T wave abnormality, lateral ischemia.  Compared to 08/28/2023, anteroseptal STEMI not present.  Left Heart Catheterization 08/28/23:   Hemodynamic data:  LV 111/8, EDP 23 mmHg.  Ao 116/59, mean 85 mmHg.  There was no pressure gradient across the aortic valve.  Angiographic data: LV: Anterolateral severe hypokinesis. LVEF 35% LM: Large-caliber vessel.  Smooth and normal. LCx: It is codominant.  Gives origin to large OM1.  OM1 has a proximal 50 to 60% stenosis. LAD: Is a large-caliber vessel.  It is moderately calcified.  Proximal and mid segment has a ulcerated 95% stenosis.  There is TIMI II flow. RCA: Codominant vessel with LCx.  There is a RCA stent in the proximal segment with in-stent restenosis of 60% to 70%.  Otherwise distal RCA and PDA appear to be smooth and normal.    Intervention data: Successful PTCA and stenting of the proximal and mid LAD with implantation of a long 3.0 x 48 mm Synergy XD DES postdilated with a 3.5 mm Cleone balloon, stenosis reduced from 95% to 0% with TIMI II to TIMI-3 flow at the end of the procedure.    Recommendation: DAPT for 1 year.  He will be watched for 4 any hemodynamic compromise, echocardiogram to be obtained, follow-up with renal function.  60 mL contrast utilized.  Echocardiogram 08/29/2023:   1. Left ventricular ejection fraction, by estimation, is 30 to 35%. The left  ventricle has moderately decreased function. The left ventricle demonstrates regional wall motion abnormalities (see scoring diagram/findings for description). There is  moderate concentric left ventricular hypertrophy. Left ventricular diastolic parameters are consistent with Grade I diastolic dysfunction (impaired relaxation). Elevated left ventricular end-diastolic pressure. There is akinesis of the left ventricular,  entire anterior wall, apical segment and septal wall.  2. Right ventricular systolic function is normal. The right ventricular size is normal.  3. Left atrial size was mildly dilated.  4. The mitral valve is normal in structure. Mild mitral valve regurgitation. No evidence of mitral stenosis.  5. The aortic valve is normal in structure. Aortic valve regurgitation is not visualized. No aortic stenosis is present.  6. The inferior vena cava is normal in size with greater than 50% respiratory variability, suggesting right atrial pressure of 3 mmHg.  Radiology:  Hospital Course: Hubert Greger is a 87 y.o. male  patient with hypertension, hypercholesterolemia, remote stenting to proximal RCA presenting with chest pain to [facility], found to have ST elevation MI, emergently transferred to [facility].  Patient was emergently taken to cardiac catheterization lab and found to have subtotally occluded and ulcerated proximal LAD, which was primarily stented and was observed in the intensive care unit overnight.  This morning patient is completely asymptomatic except for mild nausea.  His daughter is present at the bedside, patient lives with one of his daughters closer to [place].  As he remains hemodynamically stable, normal blood pressure, no significant arrhythmias on telemetry, no clinical evidence of heart failure, it is felt that he can  be discharged home with outpatient follow-up which has been arranged.  Patient was educated regarding continuation of  DAPT.  Recommendations on discharge: Patient will be referred to cardiac rehab at [facility] and also office visit set up in [place] office as he lives close by.  All questions have been answered.   Discharge Exam:    08/29/2023    7:00 AM 08/29/2023    6:15 AM 08/29/2023    6:00 AM  Vitals with BMI  Systolic 122 122 540  Diastolic 74 67 64  Pulse 79 77 69     Physical Exam Neck:     Vascular: No carotid bruit or JVD.  Cardiovascular:     Rate and Rhythm: Normal rate and regular rhythm.     Pulses: Intact distal pulses.     Heart sounds: Normal heart sounds. No murmur heard.    No gallop.  Pulmonary:     Effort: Pulmonary effort is normal.     Breath sounds: Normal breath sounds.  Abdominal:     General: Bowel sounds are normal.     Palpations: Abdomen is soft.  Musculoskeletal:     Right lower leg: No edema.     Left lower leg: No edema.     Labs:   Lab Results  Component Value Date   WBC 8.0 08/29/2023   HGB 12.8 (L) 08/29/2023   HCT 39.0 08/29/2023   MCV 95.1 08/29/2023   PLT 157 08/29/2023    Recent Labs  Lab 08/28/23 2050 08/29/23 0835  NA 139 141  K 4.0 4.2  CL 110 112*  CO2 23 24  BUN 15 13  CREATININE 1.21 1.10  CALCIUM 9.2 8.9  PROT 6.3*  --   BILITOT 0.7  --   ALKPHOS 109  --   ALT 35  --   AST 51*  --   GLUCOSE 104* 111*    Lipid Panel     Component Value Date/Time   CHOL 155 08/28/2023 2050   CHOL 188 11/12/2020 1049   TRIG 99 08/28/2023 2050   HDL 51 08/28/2023 2050   HDL 65 11/12/2020 1049   CHOLHDL 3.0 08/28/2023 2050   VLDL 20 08/28/2023 2050   LDLCALC 84 08/28/2023 2050   LDLCALC 113 (H) 11/12/2020 1049    BNP (last 3 results) No results for input(s): "BNP" in the last 8760 hours.  HEMOGLOBIN A1C No results found for: "HGBA1C", "MPG"  Cardiac Panel (last 3 results) Recent Labs    08/28/23 2050  TROPONINIHS 1,987*     TSH No results for input(s): "TSH" in the last 8760 hours.  FOLLOW UP PLANS AND  APPOINTMENTS Discharge Instructions     AMB Referral to Cardiac Rehabilitation - Phase II   Complete by: As directed    Diagnosis:  Coronary Stents STEMI     After initial evaluation and assessments completed: Virtual Based Care may be provided alone or in conjunction with Phase 2 Cardiac Rehab based on patient barriers.: Yes   Intensive Cardiac Rehabilitation (ICR) [facility] location only OR Traditional Cardiac Rehabilitation (TCR) *If criteria for ICR are not met will enroll in TCR [facility] only): Yes      Allergies as of 08/29/2023       Reactions   Triprolidine-pseudoephedrine Rash, Hives   Triprolidine-pseudoephedrine Hives, Rash   Dapsone Other (See Comments)   Methemoglobinemia   Other reaction(s): Unknown  Methemoglobinemia  Methemoglobinemia   Methemoglobinemia   Other reaction(s): Unknown  Methemoglobinemia   Sudafed Pe Cold-cough  [  phenylephrine-dm-gg-apap] Hives   Actifed Cold-allergy  [chlorpheniramine-phenylephrine] Rash        Medication List     STOP taking these medications    pravastatin 20 MG tablet Commonly known as: PRAVACHOL       TAKE these medications    acetaminophen 325 MG tablet Commonly known as: TYLENOL Take 650 mg by mouth every 6 (six) hours as needed.   aspirin 81 MG tablet Take 1 tablet by mouth daily.   CAVILON NO STING BARRIER FILM EX Apply topically as needed.   cetirizine 10 MG tablet Commonly known as: ZYRTEC Take 10 mg by mouth daily.   diphenhydramine-acetaminophen 25-500 MG Tabs tablet Commonly known as: TYLENOL PM Take 1 tablet by mouth at bedtime as needed.   losartan 25 MG tablet Commonly known as: COZAAR Take 1 tablet (25 mg total) by mouth daily.   metoprolol succinate 25 MG 24 hr tablet Commonly known as: TOPROL-XL Take 1 tablet (25 mg total) by mouth daily.   Multi-Vitamins Tabs Take by mouth.   neomycin-bacitracin-polymyxin [phone] ointment Apply 1 application topically as needed.   nitroGLYCERIN 0.4  MG SL tablet Commonly known as: NITROSTAT Place 1 tablet (0.4 mg total) under the tongue every 5 (five) minutes x 3 doses as needed for chest pain.   ondansetron 4 MG tablet Commonly known as: Zofran Take 1 tablet (4 mg total) by mouth daily as needed for nausea or vomiting.   rosuvastatin 20 MG tablet Commonly known as: CRESTOR Take 1 tablet (20 mg total) by mouth daily.   SenSura Drainable Pouch Misc Ostomy supplies as needed. Diagnosis: Bladder Cancer (C67.9)   ticagrelor 90 MG Tabs tablet Commonly known as: BRILINTA Take 1 tablet (90 mg total) by mouth 2 (two) times daily.   ticagrelor 90 MG Tabs tablet Commonly known as: Brilinta Take 1 tablet (90 mg total) by mouth 2 (two) times daily.   triamcinolone cream 0.1 % Commonly known as: KENALOG Apply 1 application topically 2 (two) times daily.        Follow-up Information     Sondra Barges, PA-C Follow up on 09/04/2023.   Specialties: Cardiology, Radiology Why: 10:55 am Contact information: [address] [place] [address] [phone]                  Yates Decamp, MD, [facility] 08/29/2023, 10:02 AM Office: [phone]

## 2023-08-29 NOTE — Telephone Encounter (Signed)
Patient discharged 08/29/2023  Has app 09/04/23 with Eula Listen, PA-C. Please call for TOC follow up appt. Tereso Newcomer, PA-C    08/29/2023 9:58 AM

## 2023-08-30 LAB — I-STAT CHEM 8, ED
BUN: 17 mg/dL (ref 8–23)
Calcium, Ion: 1.24 mmol/L (ref 1.15–1.40)
Chloride: 109 mmol/L (ref 98–111)
Creatinine, Ser: 1.2 mg/dL (ref 0.61–1.24)
Glucose, Bld: 99 mg/dL (ref 70–99)
HCT: 38 % — ABNORMAL LOW (ref 39.0–52.0)
Hemoglobin: 12.9 g/dL — ABNORMAL LOW (ref 13.0–17.0)
Potassium: 4.6 mmol/L (ref 3.5–5.1)
Sodium: 141 mmol/L (ref 135–145)
TCO2: 24 mmol/L (ref 22–32)

## 2023-08-30 NOTE — Progress Notes (Signed)
Cardiology Office Note    Date:  09/04/2023   ID:  Terry [name], [place] 09-21-1936, MRN [id number]  PCP:  Wilford Corner, PA-C  Cardiologist:  None  Electrophysiologist:  None   Chief Complaint: Hospital follow-up  History of Present Illness:   Terry [name] is a 87 y.o. male with history of CAD status post prior RCA stenting with anteroseptal STEMI status post PCI/DES to the proximal and mid LAD with a long 3.0 x 48 mm Synergy XD drug-eluting stent, HFrEF secondary to ICM, bladder cancer status post total cystectomy with ileal conduit with urostomy, HTN, HLD, GI bleed in 2018, venous insufficiency, and nephrolithiasis who presents for hospital follow-up as outlined below.  He was previously evaluated by Dr. Gwen Pounds with [facility] cardiology in 2016 with treadmill MPI on 04/05/2015 showing no evidence of ischemia or arrhythmia with normal myocardial perfusion.  Echo at that time showed an EF greater than 55%, normal wall motion, mild LVH, normal RV systolic function with mildly enlarged ventricular cavity size, mild aortic insufficiency, moderate mitral regurgitation, and moderate tricuspid regurgitation.  He was lost to cardiology thereafter.  He presented to [facility] on 08/28/2023 with chest pain and was found to have anteroseptal ST elevation MI.  He was transferred to [facility] and admitted from 9/13 through 9/14 with anteroseptal STEMI.  Emergent LHC by Dr. Jacinto Halim demonstrated proximal to mid LAD stenosis of 95% treated successfully with PCI/DES.  There was also 50 to 60% OM1 stenosis, and 60 to 70% in-stent restenosis of previously placed proximal RCA stent.  Echo postprocedure showed an EF of 30 to 35%, akinesis of the entire LV anterior wall, apical segment, and septal wall, moderate concentric LVH, grade 1 diastolic dysfunction, elevated LVEDP, normal RV systolic function and ventricular cavity size, mildly dilated left atrium, mild mitral regurgitation, and  an estimated right atrial pressure of 3 mmHg.  Discharge cardiac medications: aspirin 81 mg, losartan 25 mg, metoprolol succinate 25 mg, SL NTG 0.4 mg as needed, rosuvastatin 20 mg, ticagrelor 90 mg twice daily  He comes in today accompanied by his daughter.  He is without symptoms of angina or cardiac decompensation.  He reports chronic dyspnea that is unchanged.  No dizziness, presyncope, or syncope.  No lower extremity swelling, abdominal distention, PND, orthopnea, or early satiety.  No falls.  He denies any hematochezia or or melena.  This morning he noted frank hematuria in his urostomy bag.  His daughter reports this has intermittently happened to the patient in the setting of renal stones.  He continues to note hematuria in his urostomy bag at this time.  Remains adherent to aspirin and ticagrelor.  They will be reaching out to his urologist.   Labs independently reviewed: 08/2023 - Hgb 12.8, PLT 157, potassium 4.2, BUN 13, serum creatinine 1.1, LP(a) 46, TC 155, TG 99, HDL 51, LDL 84, albumin 3.6, AST 51, ALT normal, A1c 5.2 02/2023 - TSH normal  Past Medical History:  Diagnosis Date   Bullous pemphigoid    CAD (coronary artery disease)    Cancer (HCC)    bladder cancer   Hyperlipidemia    Hypertension    Ischemic cardiomyopathy    Kidney stones    Nephrolithiasis    Status post surgical removal and fulguration of bladder neoplasm     Past Surgical History:  Procedure Laterality Date   BLADDER SURGERY  06/2015   CORONARY/GRAFT ACUTE MI REVASCULARIZATION N/A 08/28/2023   Procedure: Coronary/Graft Acute MI Revascularization;  Surgeon: Yates Decamp, MD;  Location: [facility] INVASIVE CV LAB;  Service: Cardiovascular;  Laterality: N/A;   deviated nose septum surgery  1970   EP IMPLANTABLE DEVICE     HERNIA REPAIR  1980   single septum port     stomach ulcer surgery  1994   vascular stent  2006    Current Medications: Current Meds  Medication Sig   acetaminophen (TYLENOL) 325 MG tablet  Take 650 mg by mouth every 6 (six) hours as needed for mild pain.   cetirizine (ZYRTEC) 10 MG tablet Take 10 mg by mouth daily.   diphenhydramine-acetaminophen (TYLENOL PM) 25-500 MG TABS tablet Take 1 tablet by mouth at bedtime as needed (for sleep).   losartan (COZAAR) 25 MG tablet Take 1 tablet (25 mg total) by mouth daily.   metoprolol succinate (TOPROL-XL) 25 MG 24 hr tablet Take 1 tablet (25 mg total) by mouth daily.   Multiple Vitamin (MULTI-VITAMINS) TABS Take 1 tablet by mouth daily.   neomycin-bacitracin-polymyxin (NEOSPORIN) [phone] ointment Apply 1 application topically as needed.   nitroGLYCERIN (NITROSTAT) 0.4 MG SL tablet Place 1 tablet (0.4 mg total) under the tongue every 5 (five) minutes x 3 doses as needed for chest pain.   ondansetron (ZOFRAN) 4 MG tablet Take 1 tablet (4 mg total) by mouth daily as needed for nausea or vomiting.   Ostomy Supplies (SENSURA DRAINABLE) Pouch MISC Ostomy supplies as needed. Diagnosis: Bladder Cancer (C67.9)   rosuvastatin (CRESTOR) 20 MG tablet Take 1 tablet (20 mg total) by mouth daily.   Skin Protectants, Misc. (CAVILON NO STING BARRIER FILM EX) Apply topically as needed.   ticagrelor (BRILINTA) 90 MG TABS tablet Take 1 tablet (90 mg total) by mouth 2 (two) times daily.   triamcinolone cream (KENALOG) 0.1 % Apply 1 application topically 2 (two) times daily.    [DISCONTINUED] aspirin 81 MG tablet Take 1 tablet by mouth daily.     Allergies:   Triprolidine-pseudoephedrine, Triprolidine-pseudoephedrine, Dapsone, Sudafed pe cold-cough  [phenylephrine-dm-gg-apap], and Actifed cold-allergy  [chlorpheniramine-phenylephrine]   Social History   Socioeconomic History   Marital status: Widowed    Spouse name: Not on file   Number of children: 3   Years of education: Not on file   Highest education level: High school graduate  Occupational History   Occupation: retired  Tobacco Use   Smoking status: Former    Current packs/day: 0.00     Average packs/day: 1.5 packs/day for 50.0 years (75.0 ttl pk-yrs)    Types: Cigarettes    Start date: 06/19/1955    Quit date: 06/18/2005    Years since quitting: 18.2   Smokeless tobacco: Former    Types: Associate Professor status: Never Used  Substance and Sexual Activity   Alcohol use: No    Alcohol/week: 0.0 standard drinks of alcohol   Drug use: No   Sexual activity: Not on file  Other Topics Concern   Not on file  Social History Narrative   Not on file   Social Determinants of Health   Financial Resource Strain: Low Risk  (08/27/2023)   Received from [facility]   Overall Financial Resource Strain (CARDIA)    Difficulty of Paying Living Expenses: Not hard at all  Food Insecurity: No Food Insecurity (08/27/2023)   Received from [facility]   Hunger Vital Sign    Worried About Running Out of Food in the Last Year: Never true    Ran Out  of Food in the Last Year: Never true  Transportation Needs: No Transportation Needs (08/27/2023)   Received from [facility] - Transportation    In the past 12 months, has lack of transportation kept you from medical appointments or from getting medications?: No    Lack of Transportation (Non-Medical): No  Physical Activity: Inactive (02/01/2020)   Exercise Vital Sign    Days of Exercise per Week: 0 days    Minutes of Exercise per Session: 0 min  Stress: No Stress Concern Present (02/01/2020)   [employer] of Occupational Health - Occupational Stress Questionnaire    Feeling of Stress : Not at all  Social Connections: Moderately Integrated (02/01/2020)   Social Connection and Isolation Panel [NHANES]    Frequency of Communication with Friends and Family: More than three times a week    Frequency of Social Gatherings with Friends and Family: More than three times a week    Attends Religious Services: More than 4 times per year    Active Member of [employer] or Organizations:  Yes    Attends Banker Meetings: More than 4 times per year    Marital Status: Widowed     Family History:  The patient's family history includes Alzheimer's disease in his father; Diabetes in his mother; Heart disease in his mother; Hip fracture in his father; Hypertension in his father; Prostate cancer in his paternal uncle.  ROS:   12-point review of systems is negative unless otherwise noted in the HPI.   EKGs/Labs/Other Studies Reviewed:    Studies reviewed were summarized above. The additional studies were reviewed today:  2D echo 08/29/2023: 1. Left ventricular ejection fraction, by estimation, is 30 to 35%. The  left ventricle has moderately decreased function. The left ventricle  demonstrates regional wall motion abnormalities (see scoring  diagram/findings for description). There is  moderate concentric left ventricular hypertrophy. Left ventricular  diastolic parameters are consistent with Grade I diastolic dysfunction  (impaired relaxation). Elevated left ventricular end-diastolic pressure.  There is akinesis of the left ventricular,  entire anterior wall, apical segment and septal wall.   2. Right ventricular systolic function is normal. The right ventricular  size is normal.   3. Left atrial size was mildly dilated.   4. The mitral valve is normal in structure. Mild mitral valve  regurgitation. No evidence of mitral stenosis.   5. The aortic valve is normal in structure. Aortic valve regurgitation is  not visualized. No aortic stenosis is present.   6. The inferior vena cava is normal in size with greater than 50%  respiratory variability, suggesting right atrial pressure of 3 mmHg.  __________  LHC 08/28/2023:   Prox LAD to Mid LAD lesion is 95% stenosed.   Post intervention, there is a 0% residual stenosis.   Left Heart Catheterization 08/28/23:    Hemodynamic data:  LV 111/8, EDP 23 mmHg.  Ao 116/59, mean 85 mmHg.  There was no pressure gradient  across the aortic valve.   Angiographic data: LV: Anterolateral severe hypokinesis. LVEF 35% LM: Large-caliber vessel.  Smooth and normal. LCx: It is codominant.  Gives origin to large OM1.  OM1 has a proximal 50 to 60% stenosis. LAD: Is a large-caliber vessel.  It is moderately calcified.  Proximal and mid segment has a ulcerated 95% stenosis.  There is TIMI II flow. RCA: Codominant vessel with LCx.  There is a RCA stent in the proximal segment with in-stent restenosis of 60% to  70%.  Otherwise distal RCA and PDA appear to be smooth and normal.     Intervention data: Successful PTCA and stenting of the proximal and mid LAD with implantation of a long 3.0 x 48 mm Synergy XD DES postdilated with a 3.5 mm [place] balloon, stenosis reduced from 95% to 0% with TIMI II to TIMI-3 flow at the end of the procedure.   Recommendation: DAPT for 1 year. He will be watched for 4 any hemodynamic compromise, echocardiogram to be obtained, follow-up with renal function. 60 mL contrast utilized.  __________  Treadmill MPI 04/05/2015 Gavin Potters): Normal treadmill EKG without evidence of ischemia or arrhythmia  Normal myocardial perfusion without evidence of myocardial ischemia  __________  2D echo 04/05/2015 Gavin Potters): AORTIC ROOT          Size:Normal    Dissection:No dissection       AO Note:Sinus of valsalve aneurysm   AORTIC VALVE      Leaflets:Tricuspid         Morphology:Normal      Mobility:Fully mobile   LEFT VENTRICLE          Size:Normal              Anterior:Normal   Contraction:Normal               Lateral:Normal    Closest EF:>55% (Estimated)      Septal:Normal     LV Masses:No Masses             Apical:Normal           XLK:GMWN LVH            Inferior:Normal                                  Posterior:Normal  Dias.FxClass:N/A   MITRAL VALVE      Leaflets:Normal              Mobility:Fully mobile    Morphology:Normal BOTH MILD   LEFT ATRIUM          Size:Normal             LA Masses:No  masses     IA Septum:Normal IAS   MAIN PA          Size:Normal   PULMONIC VALVE    Morphology:Normal              Mobility:Fully mobile   RIGHT VENTRICLE     RV Masses:No Masses               Size:MILDLY ENLARGED     Free Wall:Normal           Contraction:Normal   TRICUSPID VALVE      Leaflets:Normal              Mobility:Fully mobile    Morphology:Normal   RIGHT ATRIUM          Size:MILDLY ENLARGED     RA Other:None       RA Mass:No masses   PERICARDIUM        Fluid:No effusion   INFERIOR VENACAVA          Size:Normal Normal respiratory collapse    DOPPLER ECHO and OTHER SPECIAL PROCEDURES     Aortic:MILD AR                       No AS  96.4 cm/sec peak vel          3.7 mmHg peak grad            2.0 mmHg mean grad            2.0 cm^2 by DOPPLER      Mitral:MODERATE MR                   No MS            91.8 cm/sec peak vel          3.4 mmHg peak grad            1.0 mmHg mean grad            1.8 cm^2 by DOPPLER            MV Inflow E Vel=67.1 cm/sec   MV Annulus E'Vel=nm*            E/E'Ratio=nm*   Tricuspid:MODERATE TR                   No TS            302.0 cm/sec peak TR vel      46.5 mmHg peak RV pressure   Pulmonary:MILD PR                       No PS   INTERPRETATION  NORMAL LEFT VENTRICULAR SYSTOLIC FUNCTION  NORMAL RIGHT VENTRICULAR SYSTOLIC FUNCTION  MODERATE VALVULAR REGURGITATION (See above)  NO VALVULAR STENOSIS   EKG:  EKG is ordered today.  The EKG ordered today demonstrates NSR, 65 bpm, left axis deviation, atrial bigeminy, prior septal infarct, and inferolateral T wave inversion consistent with known MI and prior tracing  Recent Labs: 08/28/2023: ALT 35 08/29/2023: BUN 13; Creatinine, Ser 1.10; Hemoglobin 12.8; Platelets 157; Potassium 4.2; Sodium 141  Recent Lipid Panel    Component Value Date/Time   CHOL 155 08/28/2023 2050   CHOL 188 11/12/2020 1049   TRIG 99 08/28/2023 2050   HDL 51 08/28/2023 2050   HDL 65 11/12/2020 1049    CHOLHDL 3.0 08/28/2023 2050   VLDL 20 08/28/2023 2050   LDLCALC 84 08/28/2023 2050   LDLCALC 113 (H) 11/12/2020 1049    PHYSICAL EXAM:    VS:  BP 132/70 (BP Location: Left Arm, Patient Position: Sitting, Cuff Size: Normal)   Pulse 65   Ht 5\' 7"  (1.702 m)   Wt 112 lb 9.6 oz (51.1 kg)   SpO2 99%   BMI 17.64 kg/m   BMI: Body mass index is 17.64 kg/m.  Physical Exam Vitals reviewed.  Constitutional:      Appearance: He is well-developed.  HENT:     Head: Normocephalic and atraumatic.  Eyes:     General:        Right eye: No discharge.        Left eye: No discharge.  Neck:     Vascular: No JVD.  Cardiovascular:     Rate and Rhythm: Normal rate and regular rhythm.     Pulses:          Posterior tibial pulses are 2+ on the right side and 2+ on the left side.     Heart sounds: Normal heart sounds, S1 normal and S2 normal. Heart sounds not distant. No midsystolic click and no opening snap. No murmur heard.    No friction rub.     Comments: Right radial arteriotomy  site without active bleeding, swelling, warmth, hematoma, or tenderness to palpation.  Resolving, soft bruising along the medial aspect of the right forearm.  Radial pulse 2+ proximal and distal to the arteriotomy site. Pulmonary:     Effort: Pulmonary effort is normal. No respiratory distress.     Breath sounds: Normal breath sounds. No decreased breath sounds, wheezing or rales.  Chest:     Chest wall: No tenderness.  Abdominal:     General: There is no distension.     Palpations: Abdomen is soft.     Tenderness: There is no abdominal tenderness.     Comments: Gross hematuria noted in the urostomy bag.  Musculoskeletal:     Cervical back: Normal range of motion.     Right lower leg: No edema.     Left lower leg: No edema.  Skin:    General: Skin is warm and dry.     Nails: There is no clubbing.  Neurological:     Mental Status: He is alert and oriented to person, place, and time.  Psychiatric:        Speech:  Speech normal.        Behavior: Behavior normal.        Thought Content: Thought content normal.        Judgment: Judgment normal.     Wt Readings from Last 3 Encounters:  09/04/23 112 lb 9.6 oz (51.1 kg)  08/28/23 118 lb 6.2 oz (53.7 kg)  11/12/20 142 lb (64.4 kg)     ASSESSMENT & PLAN:   CAD involving the native coronary arteries with recent anteroseptal STEMI without angina: He is without symptoms of angina or cardiac decompensation.  After discussion with interventional cardiology, we will hold aspirin with continuation of Brilinta 90 mg bid, for now.  Antiplatelet therapy cannot be interrupted and patient will need supportive care with gross hematuria.  Check stat CBC as outlined below with ED precautions.  Pending Hgb, ideally would like to maintain him on at least 30 days of ticagrelor with potential transition to clopidogrel thereafter.  Not ideal to transition him from ticagrelor to clopidogrel at this time given necessity for loading dose.  Continue aggressive risk factor modification and secondary prevention including rosuvastatin, metoprolol, and losartan.  For now, plan for medical management of in-stent restenosis of RCA given lack of anginal symptoms and with bleeding complications.  He will benefit from cardiac rehab moving forward.   HFrEF secondary to ICM: Euvolemic and well compensated.  Difficult to assess NYHA class with decreased functional status.  Remains on losartan and metoprolol succinate 25 mg.  Not requiring standing loop diuretic.  Plans to escalate GDMT today or postponed pending further evaluation of gross hematuria.  Moving forward, recommend escalation of GDMT as able with plans for follow-up in 3 months following PCI and on maximally tolerated GDMT to evaluate for improvement in cardiomyopathy.  Defer addition of LifeVest given advanced age and significant comorbidities, unlikely to be a candidate for ICD moving forward.  History of bladder cancer status post  total cystectomy with ileal conduit status post urostomy with gross hematuria: Woke up this morning with gross hematuria in his urostomy bag.  Exacerbated by antiplatelet therapy.  Needs urgent follow-up with urology, patient's daughter advised.  Check stat CBC today with ED precautions.  Antiplatelet therapy cannot be interrupted with recent stent measuring 48 mm in the LAD on 9/13.  Will require close monitoring.  HTN: Blood pressure is well-controlled in the office today.  Continue medical therapy as outlined above.  HLD: LDL 84 with target LDL less than 55.  Now on rosuvastatin 20 mg in place of pravastatin.  Recommend follow-up fasting lipid panel and LFT in 2 months with titration of lipid-lowering therapy as indicated to achieve target LDL.  Frequent PACs in a pattern of atrial bigeminy: Asymptomatic.  Deferred escalation of metoprolol given the above acute issue presented during office visit.  This may need to be revisited in follow up.     Disposition: F/u with Dr. Kirke Corin (to establish care) or an APP in 4 weeks.   Medication Adjustments/Labs and Tests Ordered: Current medicines are reviewed at length with the patient today.  Concerns regarding medicines are outlined above. Medication changes, Labs and Tests ordered today are summarized above and listed in the Patient Instructions accessible in Encounters.   Signed, Eula Listen, PA-C 09/04/2023 12:36 PM     [facility] [place] [address] [place], [place] [address] [phone]

## 2023-08-31 ENCOUNTER — Encounter (HOSPITAL_COMMUNITY): Payer: Self-pay | Admitting: Cardiology

## 2023-08-31 LAB — HEMOGLOBIN A1C
Hgb A1c MFr Bld: 5.2 % (ref 4.8–5.6)
Mean Plasma Glucose: 103 mg/dL

## 2023-08-31 NOTE — Telephone Encounter (Signed)
Patient contacted regarding discharge from [facility] on 08/29/23.  Patient understands to follow up with provider Eula Listen on 09/04/23 at 10:55am at [facility]. Patient understands discharge instructions? yes Patient understands medications and regiment? yes Patient understands to bring all medications to this visit? yes

## 2023-09-03 ENCOUNTER — Encounter: Payer: Self-pay | Admitting: Physician Assistant

## 2023-09-04 ENCOUNTER — Other Ambulatory Visit
Admission: RE | Admit: 2023-09-04 | Discharge: 2023-09-04 | Disposition: A | Discharge status: Home / Self Care | Payer: Medicare HMO | Source: Ambulatory Visit | Attending: Physician Assistant | Admitting: Physician Assistant

## 2023-09-04 ENCOUNTER — Other Ambulatory Visit: Payer: Self-pay | Admitting: Emergency Medicine

## 2023-09-04 ENCOUNTER — Ambulatory Visit: Payer: Medicare HMO | Attending: Physician Assistant | Admitting: Physician Assistant

## 2023-09-04 ENCOUNTER — Encounter: Payer: Self-pay | Admitting: Physician Assistant

## 2023-09-04 VITALS — BP 132/70 | HR 65 | Ht 67.0 in | Wt 112.6 lb

## 2023-09-04 DIAGNOSIS — I10 Essential (primary) hypertension: Secondary | ICD-10-CM | POA: Diagnosis not present

## 2023-09-04 DIAGNOSIS — Z79899 Other long term (current) drug therapy: Secondary | ICD-10-CM | POA: Diagnosis not present

## 2023-09-04 DIAGNOSIS — I255 Ischemic cardiomyopathy: Secondary | ICD-10-CM | POA: Diagnosis not present

## 2023-09-04 DIAGNOSIS — I5020 Unspecified systolic (congestive) heart failure: Secondary | ICD-10-CM

## 2023-09-04 DIAGNOSIS — Z9889 Other specified postprocedural states: Secondary | ICD-10-CM | POA: Diagnosis not present

## 2023-09-04 DIAGNOSIS — Z8551 Personal history of malignant neoplasm of bladder: Secondary | ICD-10-CM

## 2023-09-04 DIAGNOSIS — Z436 Encounter for attention to other artificial openings of urinary tract: Secondary | ICD-10-CM

## 2023-09-04 DIAGNOSIS — D689 Coagulation defect, unspecified: Secondary | ICD-10-CM

## 2023-09-04 DIAGNOSIS — I2102 ST elevation (STEMI) myocardial infarction involving left anterior descending coronary artery: Secondary | ICD-10-CM | POA: Diagnosis not present

## 2023-09-04 DIAGNOSIS — R310 Gross hematuria: Secondary | ICD-10-CM | POA: Diagnosis not present

## 2023-09-04 DIAGNOSIS — I2510 Atherosclerotic heart disease of native coronary artery without angina pectoris: Secondary | ICD-10-CM

## 2023-09-04 DIAGNOSIS — I491 Atrial premature depolarization: Secondary | ICD-10-CM

## 2023-09-04 DIAGNOSIS — E785 Hyperlipidemia, unspecified: Secondary | ICD-10-CM

## 2023-09-04 LAB — CBC
HCT: 43.7 % (ref 39.0–52.0)
Hemoglobin: 13.7 g/dL (ref 13.0–17.0)
MCH: 29.8 pg (ref 26.0–34.0)
MCHC: 31.4 g/dL (ref 30.0–36.0)
MCV: 95.2 fL (ref 80.0–100.0)
Platelets: 202 10*3/uL (ref 150–400)
RBC: 4.59 MIL/uL (ref 4.22–5.81)
RDW: 15.7 % — ABNORMAL HIGH (ref 11.5–15.5)
WBC: 6.5 10*3/uL (ref 4.0–10.5)
nRBC: 0 % (ref 0.0–0.2)

## 2023-09-04 LAB — BASIC METABOLIC PANEL
Anion gap: 10 (ref 5–15)
BUN: 14 mg/dL (ref 8–23)
CO2: 20 mmol/L — ABNORMAL LOW (ref 22–32)
Calcium: 9.8 mg/dL (ref 8.9–10.3)
Chloride: 107 mmol/L (ref 98–111)
Creatinine, Ser: 1.15 mg/dL (ref 0.61–1.24)
GFR, Estimated: >60 mL/min (ref >60)
Glucose, Bld: 90 mg/dL (ref 70–99)
Potassium: 4.2 mmol/L (ref 3.5–5.1)
Sodium: 137 mmol/L (ref 135–145)

## 2023-09-04 LAB — MAGNESIUM: Magnesium: 2.7 mg/dL — ABNORMAL HIGH (ref 1.7–2.4)

## 2023-09-04 NOTE — Progress Notes (Signed)
As per DPR, Daughter, Pam, called with results and updated on below info.  Orders placed for CBC Monday  Pam verbalized understanding. All questions were answered.   Sondra Barges, PA-C 09/04/2023  1:01 PM   "Please call patient's daughter [name] at [phone].  Blood count is improved when compared to discharge hemoglobin, though sometimes it can take a little while for labs to catch up to a bleed.  Fortunately, usually hematuria actually looks worse than it actually is.  Kidney function normal and potassium at goal.  Magnesium mildly high.  Recommendations: -ED precautions including should hematuria persist recommend he present to the ED for further evaluation, or if he becomes symptomatic with dizziness, lightheadedness, chest pain, or progressive dyspnea -Recommend follow-up with CBC on 09/07/2023 -Do not stop Brilinta -Needs urgent urology evaluation"

## 2023-09-04 NOTE — Patient Instructions (Signed)
Medication Instructions:  Your physician recommends the following medication changes.  STOP TAKING: Aspirin  *If you need a refill on your cardiac medications before your next appointment, please call your pharmacy*   Lab Work: Your provider would like for you to have following labs drawn today BMT, CBC, and Mag level.   If you have labs (blood work) drawn today and your tests are completely normal, you will receive your results only by: MyChart Message (if you have MyChart) OR A paper copy in the mail If you have any lab test that is abnormal or we need to change your treatment, we will call you to review the results.  Follow-Up: At [facility], you and your health needs are our priority.  As part of our continuing mission to provide you with exceptional heart care, we have created designated Provider Care Teams.  These Care Teams include your primary Cardiologist (physician) and Advanced Practice Providers (APPs -  Physician Assistants and Nurse Practitioners) who all work together to provide you with the care you need, when you need it.  We recommend signing up for the patient portal called "MyChart".  Sign up information is provided on this After Visit Summary.  MyChart is used to connect with patients for Virtual Visits (Telemedicine).  Patients are able to view lab/test results, encounter notes, upcoming appointments, etc.  Non-urgent messages can be sent to your provider as well.   To learn more about what you can do with MyChart, go to [email or web address].    Your next appointment:   4 week(s) ideally  Provider:   You may see Lorine Bears, MD or one of the following Advanced Practice Providers on your designated Care Team:   Eula Listen, [place]

## 2023-09-06 ENCOUNTER — Other Ambulatory Visit: Payer: Self-pay

## 2023-09-06 ENCOUNTER — Emergency Department
Admission: EM | Admit: 2023-09-06 | Discharge: 2023-09-06 | Disposition: A | Discharge status: Home / Self Care | Payer: Medicare HMO | Attending: Emergency Medicine | Admitting: Emergency Medicine

## 2023-09-06 DIAGNOSIS — Z87442 Personal history of urinary calculi: Secondary | ICD-10-CM | POA: Diagnosis not present

## 2023-09-06 DIAGNOSIS — Z8551 Personal history of malignant neoplasm of bladder: Secondary | ICD-10-CM | POA: Insufficient documentation

## 2023-09-06 DIAGNOSIS — N3001 Acute cystitis with hematuria: Secondary | ICD-10-CM | POA: Diagnosis not present

## 2023-09-06 DIAGNOSIS — R310 Gross hematuria: Secondary | ICD-10-CM

## 2023-09-06 DIAGNOSIS — R319 Hematuria, unspecified: Secondary | ICD-10-CM | POA: Diagnosis present

## 2023-09-06 LAB — URINALYSIS, ROUTINE W REFLEX MICROSCOPIC
RBC / HPF: >50 RBC/hpf (ref 0–5)
Squamous Epithelial / HPF: 0 /HPF (ref 0–5)

## 2023-09-06 LAB — BASIC METABOLIC PANEL
Anion gap: 10 (ref 5–15)
BUN: 16 mg/dL (ref 8–23)
CO2: 22 mmol/L (ref 22–32)
Calcium: 9.8 mg/dL (ref 8.9–10.3)
Chloride: 108 mmol/L (ref 98–111)
Creatinine, Ser: 1.2 mg/dL (ref 0.61–1.24)
GFR, Estimated: 59 mL/min — ABNORMAL LOW (ref >60)
Glucose, Bld: 95 mg/dL (ref 70–99)
Potassium: 4.6 mmol/L (ref 3.5–5.1)
Sodium: 140 mmol/L (ref 135–145)

## 2023-09-06 LAB — CBC
HCT: 41.9 % (ref 39.0–52.0)
Hemoglobin: 13.1 g/dL (ref 13.0–17.0)
MCH: 30 pg (ref 26.0–34.0)
MCHC: 31.3 g/dL (ref 30.0–36.0)
MCV: 95.9 fL (ref 80.0–100.0)
Platelets: 254 10*3/uL (ref 150–400)
RBC: 4.37 MIL/uL (ref 4.22–5.81)
RDW: 15.8 % — ABNORMAL HIGH (ref 11.5–15.5)
WBC: 7.8 10*3/uL (ref 4.0–10.5)
nRBC: 0 % (ref 0.0–0.2)

## 2023-09-06 MED ORDER — CEPHALEXIN 500 MG PO CAPS: 500.0000 mg | ORAL_CAPSULE | Freq: Two times a day (BID) | ORAL | 0 refills | Status: AC

## 2023-09-06 NOTE — Discharge Instructions (Addendum)
You were seen in the emergency department for blood in your urostomy bag.  You had findings of a questionable urinary tract infection.  Your urine was sent for culture.  You are prescribed an antibiotic, take the first dose today.  Call and follow-up closely with urology.  Return to the emergency department for any worsening symptoms.  Thank you for choosing [place] for your health care, it was my pleasure to care for you today!  Corena Herter, MD

## 2023-09-06 NOTE — ED Provider Notes (Signed)
[facility] Provider Note    Event Date/Time   First MD Initiated Contact with Patient 09/06/23 1306     (approximate)   History   Hematuria   HPI  Terry [name] is a 87 y.o. male past medical history significant for bladder cancer, recent heart attack with cardiac catheterization, PCI and started on Brilinta, history of kidney stones, who presents to the emergency department with hematuria.  Hematuria since Friday that has been ongoing.  History of bladder cancer and had his bladder removed.  Last evaluated with urology at [facility] in 2022.  Does have a history of kidney stones.  Denies any pain.  Denies nausea vomiting.  Denies any lightheadedness.  No fever or chills.     Physical Exam   Triage Vital Signs: ED Triage Vitals  Encounter Vitals Group     BP 09/06/23 1142 133/64     Systolic BP Percentile --      Diastolic BP Percentile --      Pulse Rate 09/06/23 1142 81     Resp 09/06/23 1142 17     Temp 09/06/23 1142 97.8 F (36.6 C)     Temp src --      SpO2 09/06/23 1142 99 %     Weight 09/06/23 1143 116 lb (52.6 kg)     Height 09/06/23 1143 5\' 7"  (1.702 m)     Head Circumference --      Peak Flow --      Pain Score 09/06/23 1142 0     Pain Loc --      Pain Education --      Exclude from Growth Chart --     Most recent vital signs: Vitals:   09/06/23 1142  BP: 133/64  Pulse: 81  Resp: 17  Temp: 97.8 F (36.6 C)  SpO2: 99%    Physical Exam Constitutional:      Appearance: He is well-developed.  HENT:     Head: Atraumatic.  Eyes:     Conjunctiva/sclera: Conjunctivae normal.  Cardiovascular:     Rate and Rhythm: Regular rhythm.  Pulmonary:     Effort: No respiratory distress.  Abdominal:     Tenderness: There is no abdominal tenderness. There is no right CVA tenderness or left CVA tenderness.     Comments: Urostomy bag with gross hematuria  Musculoskeletal:        General: Normal range of motion.     Cervical back:  Normal range of motion.     Right lower leg: No edema.     Left lower leg: No edema.  Skin:    General: Skin is warm.     Capillary Refill: Capillary refill takes less than 2 seconds.  Neurological:     Mental Status: He is alert. Mental status is at baseline.      IMPRESSION / MDM / ASSESSMENT AND PLAN / ED COURSE  I reviewed the triage vital signs and the nursing notes.  Differential diagnosis including hematuria, malignancy, kidney stone, complicated urinary tract infection  On chart review last evaluated with urology in 2022.  History of bladder cancer and had bladder cancer removed had a urostomy    Labs (all labs ordered are listed, but only abnormal results are displayed) Labs interpreted as -    Labs Reviewed  URINALYSIS, ROUTINE W REFLEX MICROSCOPIC - Abnormal; Notable for the following components:      Result Value   Color, Urine RED (*)    APPearance  TURBID (*)    Glucose, UA   (*)    Value: TEST NOT REPORTED DUE TO COLOR INTERFERENCE OF URINE PIGMENT   Hgb urine dipstick   (*)    Value: TEST NOT REPORTED DUE TO COLOR INTERFERENCE OF URINE PIGMENT   Bilirubin Urine   (*)    Value: TEST NOT REPORTED DUE TO COLOR INTERFERENCE OF URINE PIGMENT   Ketones, ur   (*)    Value: TEST NOT REPORTED DUE TO COLOR INTERFERENCE OF URINE PIGMENT   Protein, ur   (*)    Value: TEST NOT REPORTED DUE TO COLOR INTERFERENCE OF URINE PIGMENT   Nitrite   (*)    Value: TEST NOT REPORTED DUE TO COLOR INTERFERENCE OF URINE PIGMENT   Leukocytes,Ua   (*)    Value: TEST NOT REPORTED DUE TO COLOR INTERFERENCE OF URINE PIGMENT   Bacteria, UA MANY (*)    All other components within normal limits  BASIC METABOLIC PANEL - Abnormal; Notable for the following components:   GFR, Estimated 59 (*)    All other components within normal limits  CBC - Abnormal; Notable for the following components:   RDW 15.8 (*)    All other components within normal limits  URINE CULTURE    Significant color  interference but does have many bacteria and WBCs.  Urine was sent for culture.  Will start the patient on antibiotics.  Possible complicated urinary tract infection with hematuria versus hematuria from kidney stone, malignancy or Brilinta use.  Discussed close follow-up with urology.  Given return precautions for any worsening symptoms.  Lab work with no significant anemia.  Creatinine at baseline.     PROCEDURES:  Critical Care performed: No  Procedures  Patient's presentation is most consistent with acute presentation with potential threat to life or bodily function.   MEDICATIONS ORDERED IN ED: Medications - No data to display  FINAL CLINICAL IMPRESSION(S) / ED DIAGNOSES   Final diagnoses:  Gross hematuria  Acute cystitis with hematuria     Rx / DC Orders   ED Discharge Orders          Ordered    cephALEXin (KEFLEX) 500 MG capsule  2 times daily        09/06/23 1326             Note:  This document was prepared using Dragon voice recognition software and may include unintentional dictation errors.   Corena Herter, MD 09/06/23 [phone]

## 2023-09-06 NOTE — ED Notes (Signed)
See triage notes. Patient began having blood in his urostomy bag Friday morning. Patient recently started Brilinta

## 2023-09-06 NOTE — ED Triage Notes (Signed)
Pt with daughter. As per daughter, pt has had blood in urine since Friday and was told to come to the ED if it does not resolve. Per daughter, pt had a cardiac stent on 08/28/23 and cardiology was going to refer pt to a urologist.  Pt has urostomy.

## 2023-09-08 ENCOUNTER — Ambulatory Visit: Payer: Medicare HMO | Admitting: Physician Assistant

## 2023-09-08 ENCOUNTER — Telehealth (HOSPITAL_COMMUNITY): Payer: Self-pay

## 2023-09-08 NOTE — Telephone Encounter (Signed)
Pt insurance is active and benefits verified through [facility]. Co-pay $25.00, DED $0.00/$0.00 met, out of pocket $3,600.00/$710.80 met, co-insurance 0%. No pre-authorization required. Passport, 09/08/23 @ 10:29AM, REF#[id number]   How many CR sessions are covered? (36 visits for TCR, 72 visits for ICR)72 Is this a lifetime maximum or an annual maximum? Lifetime Has the member used any of these services to date? No Is there a time limit (weeks/months) on start of program and/or program completion? No     Will contact patient to see if he is interested in the Cardiac Rehab Program. If interested, patient will need to complete follow up appt. Once completed, patient will be contacted for scheduling upon review by the RN Navigator.

## 2023-09-08 NOTE — Telephone Encounter (Signed)
Called and spoke with pt daughter [name] who stated pt is not interested in CR at this time.  Closed referral

## 2023-09-09 LAB — URINE CULTURE: Culture: >=100000 — AB

## 2023-09-15 ENCOUNTER — Other Ambulatory Visit: Payer: Self-pay | Admitting: Cardiology

## 2023-09-18 ENCOUNTER — Other Ambulatory Visit: Payer: Self-pay | Admitting: *Deleted

## 2023-09-18 ENCOUNTER — Other Ambulatory Visit: Payer: Self-pay | Admitting: Cardiology

## 2023-09-18 NOTE — Telephone Encounter (Signed)
Refill Request.  

## 2023-09-18 NOTE — Telephone Encounter (Signed)
Error please disregard refill for Brilinta.

## 2023-09-18 NOTE — Telephone Encounter (Signed)
*  STAT* If patient is at the pharmacy, call can be transferred to refill team.   1. Which medications need to be refilled? (please list name of each medication and dose if known) ticagrelor (BRILINTA) 90 MG TABS tablet losartan (COZAAR) 25 MG tablet metoprolol succinate (TOPROL-XL) 25 MG 24 hr tablet rosuvastatin (CRESTOR) 20 MG tablet   2. Would you like to learn more about the convenience, safety, & potential cost savings by using the [facility]? No   3. Are you open to using the [facility] No   4. Which pharmacy/location (including street and city if local pharmacy) is medication to be sent to? [facility] - [place], [place] - [address]     5. Do they need a 30 day or 90 day supply? 90 Day Supply

## 2023-09-21 ENCOUNTER — Other Ambulatory Visit: Payer: Self-pay | Admitting: Emergency Medicine

## 2023-09-21 MED ORDER — ROSUVASTATIN CALCIUM 20 MG PO TABS: 20.0000 mg | ORAL_TABLET | Freq: Every day | ORAL | 3 refills | Status: DC

## 2023-09-21 MED ORDER — METOPROLOL SUCCINATE ER 25 MG PO TB24: 25.0000 mg | ORAL_TABLET | Freq: Every day | ORAL | 3 refills | Status: DC

## 2023-09-21 MED ORDER — TICAGRELOR 90 MG PO TABS: 90.0000 mg | ORAL_TABLET | Freq: Two times a day (BID) | ORAL | 3 refills | Status: DC

## 2023-09-21 MED ORDER — LOSARTAN POTASSIUM 25 MG PO TABS: 25.0000 mg | ORAL_TABLET | Freq: Every day | ORAL | 3 refills | Status: DC

## 2023-09-30 ENCOUNTER — Ambulatory Visit
Admission: RE | Admit: 2023-09-30 | Discharge: 2023-09-30 | Disposition: A | Discharge status: Home / Self Care | Payer: Medicare HMO | Source: Ambulatory Visit | Attending: Urology | Admitting: Urology

## 2023-09-30 ENCOUNTER — Ambulatory Visit
Admission: RE | Admit: 2023-09-30 | Discharge: 2023-09-30 | Disposition: A | Discharge status: Home / Self Care | Payer: Medicare HMO | Attending: Urology | Admitting: Urology

## 2023-09-30 ENCOUNTER — Ambulatory Visit: Payer: Medicare HMO | Admitting: Urology

## 2023-09-30 VITALS — BP 114/67 | HR 58 | Ht 67.0 in | Wt 113.4 lb

## 2023-09-30 DIAGNOSIS — Z8546 Personal history of malignant neoplasm of prostate: Secondary | ICD-10-CM

## 2023-09-30 DIAGNOSIS — Z87442 Personal history of urinary calculi: Secondary | ICD-10-CM | POA: Diagnosis not present

## 2023-09-30 DIAGNOSIS — R310 Gross hematuria: Secondary | ICD-10-CM

## 2023-09-30 DIAGNOSIS — R319 Hematuria, unspecified: Secondary | ICD-10-CM | POA: Diagnosis not present

## 2023-09-30 NOTE — Progress Notes (Signed)
Marcelle Overlie Plume,acting as a scribe for Vanna Scotland, MD.,have documented all relevant documentation on the behalf of Vanna Scotland, MD,as directed by  Vanna Scotland, MD while in the presence of Vanna Scotland, MD.  09/30/2023 4:15 PM   Terry [name] [facility] 1936/01/28 [id number]  Referring provider: Wilford Corner, PA-C [address] [place],  [place] [address]  Chief Complaint  Patient presents with   Establish Care   Hematuria    HPI: 87 year-old male with a personal history of bladder cancer and kidney stones who presents today for further evaluation of gross hemtauria.   He is managed at [facility]. He was last seen there in 2022.   He also has a personal history of nephrolithiasis. He is status post left nephrolithotomy in 2018, and right PCNL in 2021. Stone composition 30% calcium phosphate, 30% phosphate, 30% magnesium phosphate, and 10% calcium carbonate.  He also has a personal history of pT3a, status post cystectomy with lymph node dissection in 2016 by Dr. Joseph Art complicated by ureteral enteric stricture requiring revision in 2016.   She is also followed by Ardean Larsen, FNP, from [facility] oncology.  He last had a virtual visit at [facility] on 04/16/2023 for his history of bladder cancer.  Notes indicate that he was treated with 2 years of pembrolizumab postop.  His last cross-sectional imaging was that same day with CT of the chest abdomen pelvis.  This revealed mild bilateral hydronephrosis with ureteral enhancement to the level of the ileal conduit anastomosis which was stable.  He also had bilateral renal calculi, largest 1 cm in the left lower pole.  No other masses.  No evidence of metastatic recurrent disease.  He presents today for further evaluation of blood in his urostomy bag. He does not have any recent cross-sectional imaging. He was seen in the emergency room on 09/06/2023 with blood in his urine. They diagnosed him with a "UTI" from urine obtain from urostomy bag  and he was treated with Keflex. He notes that his symptoms resolved after the medication course. He had no systemic signs of infection.     PMH: Past Medical History:  Diagnosis Date   Bullous pemphigoid    CAD (coronary artery disease)    Cancer (HCC)    bladder cancer   Hyperlipidemia    Hypertension    Ischemic cardiomyopathy    Kidney stones    Nephrolithiasis    Status post surgical removal and fulguration of bladder neoplasm     Surgical History: Past Surgical History:  Procedure Laterality Date   BLADDER SURGERY  06/2015   CORONARY/GRAFT ACUTE MI REVASCULARIZATION N/A 08/28/2023   Procedure: Coronary/Graft Acute MI Revascularization;  Surgeon: Yates Decamp, MD;  Location: [facility] INVASIVE CV LAB;  Service: Cardiovascular;  Laterality: N/A;   deviated nose septum surgery  1970   EP IMPLANTABLE DEVICE     HERNIA REPAIR  1980   single septum port     stomach ulcer surgery  1994   vascular stent  2006    Home Medications:  Allergies as of 09/30/2023       Reactions   Triprolidine-pseudoephedrine Rash, Hives   Triprolidine-pseudoephedrine Hives, Rash   Dapsone Other (See Comments)   Methemoglobinemia   Other reaction(s): Unknown  Methemoglobinemia  Methemoglobinemia   Methemoglobinemia   Other reaction(s): Unknown  Methemoglobinemia   Sudafed Pe Cold-cough  [phenylephrine-dm-gg-apap] Hives   Actifed Cold-allergy  [chlorpheniramine-phenylephrine] Rash        Medication List  Accurate as of September 30, 2023  4:15 PM. If you have any questions, ask your nurse or doctor.          acetaminophen 325 MG tablet Commonly known as: TYLENOL Take 650 mg by mouth every 6 (six) hours as needed for mild pain.   CAVILON NO STING BARRIER FILM EX Apply topically as needed.   cetirizine 10 MG tablet Commonly known as: ZYRTEC Take 10 mg by mouth daily.   diphenhydramine-acetaminophen 25-500 MG Tabs tablet Commonly known as: TYLENOL PM Take 1 tablet by mouth at  bedtime as needed (for sleep).   losartan 25 MG tablet Commonly known as: COZAAR Take 1 tablet (25 mg total) by mouth daily.   metoprolol succinate 25 MG 24 hr tablet Commonly known as: TOPROL-XL Take 1 tablet (25 mg total) by mouth daily.   Multi-Vitamins Tabs Take 1 tablet by mouth daily.   neomycin-bacitracin-polymyxin [phone] ointment Apply 1 application topically as needed.   nitroGLYCERIN 0.4 MG SL tablet Commonly known as: NITROSTAT Place 1 tablet (0.4 mg total) under the tongue every 5 (five) minutes x 3 doses as needed for chest pain.   ondansetron 4 MG tablet Commonly known as: Zofran Take 1 tablet (4 mg total) by mouth daily as needed for nausea or vomiting.   rosuvastatin 20 MG tablet Commonly known as: CRESTOR Take 1 tablet (20 mg total) by mouth daily.   SenSura Drainable Pouch Misc Ostomy supplies as needed. Diagnosis: Bladder Cancer (C67.9)   ticagrelor 90 MG Tabs tablet Commonly known as: Brilinta Take 1 tablet (90 mg total) by mouth 2 (two) times daily.   triamcinolone cream 0.1 % Commonly known as: KENALOG Apply 1 application topically 2 (two) times daily.        Allergies:  Allergies  Allergen Reactions   Triprolidine-Pseudoephedrine Rash and Hives   Triprolidine-Pseudoephedrine Hives and Rash   Dapsone Other (See Comments)    Methemoglobinemia   Other reaction(s): Unknown  Methemoglobinemia   Methemoglobinemia   Methemoglobinemia   Other reaction(s): Unknown  Methemoglobinemia   Sudafed Pe Cold-Cough  [Phenylephrine-Dm-Gg-Apap] Hives   Actifed Cold-Allergy  [Chlorpheniramine-Phenylephrine] Rash    Family History: Family History  Problem Relation Age of Onset   Diabetes Mother    Heart disease Mother    Hypertension Father    Alzheimer's disease Father    Hip fracture Father    Prostate cancer Paternal Uncle     Social History:  reports that he quit smoking about 18 years ago. His smoking use included cigarettes. He  started smoking about 68 years ago. He has a 75 pack-year smoking history. He has quit using smokeless tobacco.  His smokeless tobacco use included chew. He reports that he does not drink alcohol and does not use drugs.   Physical Exam: BP 114/67   Pulse (!) 58   Ht 5\' 7"  (1.702 m)   Wt 113 lb 6 oz (51.4 kg)   BMI 17.76 kg/m   Constitutional:  Alert and oriented, No acute distress.  Accompanied by daughter today.   HEENT: [place] AT, moist mucus membranes.  Trachea midline, no masses. Abdomen: Urostomy stoma right lower quadrant that is pink and healthy, draining clear yellow urine with some scant mucous Neurologic: Grossly intact, no focal deficits, moving all 4 extremities. Psychiatric: Normal mood and affect.   Assessment & Plan:    1. History of kidney stones - Monitor for any changes or movement of stones via KUB  2. Gross hematuria - Plan to order  an x-ray to check for any movement of kidney stones since the last scan in May. -Being followed closely by medical oncology, he said no evidence of radiographic recurrence since 5/24 at the time of his last scan.  It is unlikely that this is related to malignancy.  No further repeat imaging indicated at this time.  3. History of bladder cancer - Recommend follow-up with urology at [facility], possibly next year, to ensure comprehensive management of his complex history.  4. Chronic bacteruria -Reviewed the concept of chronic bacteriuria in the setting of ileal conduit.  He will always have a "UTI "when interpreted by other providers.  If he does not have signs of systemic infection, would not recommend antibiotics.  Return for KUB.  I have reviewed the above documentation for accuracy and completeness, and I agree with the above.   Vanna Scotland, MD  [facility] [address] [place], [place] [address] [phone]

## 2023-10-01 NOTE — Progress Notes (Signed)
Cardiology Office Note    Date:  10/02/2023   ID:  [name], [name] October 09, 1936, MRN [id number]  PCP:  Wilford Corner, PA-C  Cardiologist:  Evaluated by Dr. Jacinto Halim during admission   Electrophysiologist:  None   Chief Complaint: Follow-up  History of Present Illness:   Terry [name] is a 87 y.o. male with history of CAD status post prior RCA stenting with anteroseptal STEMI status post PCI/DES to the proximal and mid LAD with a long 3.0 x 48 mm Synergy XD drug-eluting stent, HFrEF secondary to ICM, bladder cancer status post total cystectomy with ileal conduit with urostomy complicated by hematuria, HTN, HLD, GI bleed in 2018, venous insufficiency, and nephrolithiasis who presents for follow-up of his CAD and cardiomyopathy.   He was previously evaluated by Dr. Gwen Pounds with [facility] cardiology in 2016 with treadmill MPI on 04/05/2015 showing no evidence of ischemia or arrhythmia with normal myocardial perfusion.  Echo at that time showed an EF greater than 55%, normal wall motion, mild LVH, normal RV systolic function with mildly enlarged ventricular cavity size, mild aortic insufficiency, moderate mitral regurgitation, and moderate tricuspid regurgitation.  He was lost to cardiology thereafter.   He presented to [facility] on 08/28/2023 with chest pain and was found to have anteroseptal ST elevation MI.  He was transferred to [facility] and admitted from 9/13 through 9/14 with anteroseptal STEMI.  Emergent LHC by Dr. Jacinto Halim demonstrated proximal to mid LAD stenosis of 95% treated successfully with PCI/DES.  There was also 50 to 60% OM1 stenosis, and 60 to 70% in-stent restenosis of previously placed proximal RCA stent.  Echo postprocedure showed an EF of 30 to 35%, akinesis of the entire LV anterior wall, apical segment, and septal wall, moderate concentric LVH, grade 1 diastolic dysfunction, elevated LVEDP, normal RV systolic function and ventricular cavity size,  mildly dilated left atrium, mild mitral regurgitation, and an estimated right atrial pressure of 3 mmHg.   He was seen in hospital follow-up on 09/04/2023 and was without symptoms of angina or cardiac decompensation.  Chronic dyspnea was unchanged.  On the morning of his appointment he noted hematuria in his urostomy bag.  His daughter reported this had intermittently happened in the past with renal stones.  Hemoglobin stable at that time.  He was advised to follow-up with his urologist.  He was subsequently evaluated in the ED on 09/06/2023 with ongoing hematuria.  Hemoglobin was again stable.  He was placed on Keflex for potential UTI and advised to follow-up with urology.  He was evaluated by neurology on 09/30/2023 and reported that his hematuria resolved after antibiotic with likelihood of malignancy recurrence felt to be low based on prior imaging.  Plain film pending to evaluate for renal stone.  He comes in accompanied by his daughter today and is without symptoms of angina or cardiac decompensation.  Chronic dyspnea that predates his MI is unchanged.  No dizziness, presyncope, or syncope.  No lower extremity swelling, abdominal distention, orthopnea.  No falls or symptoms concerning for bleeding.  No further hematuria in his urostomy bag.  Remains adherent to ticagrelor.   Labs independently reviewed: 08/2023 - Hgb 13.1, PLT 254, potassium 4.6, BUN 16, serum creatinine 1.2, LP(a) 46, TC 155, TG 99, HDL 51, LDL 84, albumin 3.6, AST 51, ALT normal, A1c 5.2, magnesium 2.7 02/2023 - TSH normal  Past Medical History:  Diagnosis Date   Bullous pemphigoid    CAD (coronary artery disease)  Cancer [facility])    bladder cancer   Hyperlipidemia    Hypertension    Ischemic cardiomyopathy    Kidney stones    Nephrolithiasis    Status post surgical removal and fulguration of bladder neoplasm     Past Surgical History:  Procedure Laterality Date   BLADDER SURGERY  06/2015   CORONARY/GRAFT ACUTE MI  REVASCULARIZATION N/A 08/28/2023   Procedure: Coronary/Graft Acute MI Revascularization;  Surgeon: Yates Decamp, MD;  Location: [facility] INVASIVE CV LAB;  Service: Cardiovascular;  Laterality: N/A;   deviated nose septum surgery  1970   EP IMPLANTABLE DEVICE     HERNIA REPAIR  1980   single septum port     stomach ulcer surgery  1994   vascular stent  2006    Current Medications: Current Meds  Medication Sig   acetaminophen (TYLENOL) 325 MG tablet Take 650 mg by mouth every 6 (six) hours as needed for mild pain.   cetirizine (ZYRTEC) 10 MG tablet Take 10 mg by mouth daily.   diphenhydramine-acetaminophen (TYLENOL PM) 25-500 MG TABS tablet Take 1 tablet by mouth at bedtime as needed (for sleep).   losartan (COZAAR) 25 MG tablet Take 1 tablet (25 mg total) by mouth daily.   metoprolol succinate (TOPROL-XL) 25 MG 24 hr tablet Take 1 tablet (25 mg total) by mouth daily.   Multiple Vitamin (MULTI-VITAMINS) TABS Take 1 tablet by mouth daily.   neomycin-bacitracin-polymyxin (NEOSPORIN) [phone] ointment Apply 1 application topically as needed.   nitroGLYCERIN (NITROSTAT) 0.4 MG SL tablet Place 1 tablet (0.4 mg total) under the tongue every 5 (five) minutes x 3 doses as needed for chest pain.   ondansetron (ZOFRAN) 4 MG tablet Take 1 tablet (4 mg total) by mouth daily as needed for nausea or vomiting.   Ostomy Supplies (SENSURA DRAINABLE) Pouch MISC Ostomy supplies as needed. Diagnosis: Bladder Cancer (C67.9)   rosuvastatin (CRESTOR) 20 MG tablet Take 1 tablet (20 mg total) by mouth daily.   Skin Protectants, Misc. (CAVILON NO STING BARRIER FILM EX) Apply topically as needed.   ticagrelor (BRILINTA) 90 MG TABS tablet Take 1 tablet (90 mg total) by mouth 2 (two) times daily.   triamcinolone cream (KENALOG) 0.1 % Apply 1 application topically 2 (two) times daily.     Allergies:   Triprolidine-pseudoephedrine, Triprolidine-pseudoephedrine, Dapsone, Sudafed pe cold-cough  [phenylephrine-dm-gg-apap], and  Actifed cold-allergy  [chlorpheniramine-phenylephrine]   Social History   Socioeconomic History   Marital status: Widowed    Spouse name: Not on file   Number of children: 3   Years of education: Not on file   Highest education level: High school graduate  Occupational History   Occupation: retired  Tobacco Use   Smoking status: Former    Current packs/day: 0.00    Average packs/day: 1.5 packs/day for 50.0 years (75.0 ttl pk-yrs)    Types: Cigarettes    Start date: 06/19/1955    Quit date: 06/18/2005    Years since quitting: 18.3   Smokeless tobacco: Former    Types: Engineer, drilling   Vaping status: Never Used  Substance and Sexual Activity   Alcohol use: No    Alcohol/week: 0.0 standard drinks of alcohol   Drug use: No   Sexual activity: Not on file  Other Topics Concern   Not on file  Social History Narrative   Not on file   Social Determinants of Health   Financial Resource Strain: Low Risk  (08/27/2023)   Received from [facility]  Overall Financial Resource Strain (CARDIA)    Difficulty of Paying Living Expenses: Not hard at all  Food Insecurity: No Food Insecurity (08/27/2023)   Received from [facility]   Hunger Vital Sign    Worried About Running Out of Food in the Last Year: Never true    Ran Out of Food in the Last Year: Never true  Transportation Needs: No Transportation Needs (08/27/2023)   Received from [facility] - Transportation    In the past 12 months, has lack of transportation kept you from medical appointments or from getting medications?: No    Lack of Transportation (Non-Medical): No  Physical Activity: Inactive (02/01/2020)   Exercise Vital Sign    Days of Exercise per Week: 0 days    Minutes of Exercise per Session: 0 min  Stress: No Stress Concern Present (02/01/2020)   [employer] of Occupational Health - Occupational Stress Questionnaire    Feeling of Stress : Not at all   Social Connections: Moderately Integrated (02/01/2020)   Social Connection and Isolation Panel [NHANES]    Frequency of Communication with Friends and Family: More than three times a week    Frequency of Social Gatherings with Friends and Family: More than three times a week    Attends Religious Services: More than 4 times per year    Active Member of [employer] or Organizations: Yes    Attends Banker Meetings: More than 4 times per year    Marital Status: Widowed     Family History:  The patient's family history includes Alzheimer's disease in his father; Diabetes in his mother; Heart disease in his mother; Hip fracture in his father; Hypertension in his father; Prostate cancer in his paternal uncle.  ROS:   12-point review of systems is negative unless otherwise noted in the HPI.   EKGs/Labs/Other Studies Reviewed:    Studies reviewed were summarized above. The additional studies were reviewed today:  2D echo 08/29/2023: 1. Left ventricular ejection fraction, by estimation, is 30 to 35%. The  left ventricle has moderately decreased function. The left ventricle  demonstrates regional wall motion abnormalities (see scoring  diagram/findings for description). There is  moderate concentric left ventricular hypertrophy. Left ventricular  diastolic parameters are consistent with Grade I diastolic dysfunction  (impaired relaxation). Elevated left ventricular end-diastolic pressure.  There is akinesis of the left ventricular,  entire anterior wall, apical segment and septal wall.   2. Right ventricular systolic function is normal. The right ventricular  size is normal.   3. Left atrial size was mildly dilated.   4. The mitral valve is normal in structure. Mild mitral valve  regurgitation. No evidence of mitral stenosis.   5. The aortic valve is normal in structure. Aortic valve regurgitation is  not visualized. No aortic stenosis is present.   6. The inferior vena cava is  normal in size with greater than 50%  respiratory variability, suggesting right atrial pressure of 3 mmHg.  __________   LHC 08/28/2023:   Prox LAD to Mid LAD lesion is 95% stenosed.   Post intervention, there is a 0% residual stenosis.   Left Heart Catheterization 08/28/23:    Hemodynamic data:  LV 111/8, EDP 23 mmHg.  Ao 116/59, mean 85 mmHg.  There was no pressure gradient across the aortic valve.   Angiographic data: LV: Anterolateral severe hypokinesis. LVEF 35% LM: Large-caliber vessel.  Smooth and normal. LCx: It is codominant.  Gives origin to  large OM1.  OM1 has a proximal 50 to 60% stenosis. LAD: Is a large-caliber vessel.  It is moderately calcified.  Proximal and mid segment has a ulcerated 95% stenosis.  There is TIMI II flow. RCA: Codominant vessel with LCx.  There is a RCA stent in the proximal segment with in-stent restenosis of 60% to 70%.  Otherwise distal RCA and PDA appear to be smooth and normal.     Intervention data: Successful PTCA and stenting of the proximal and mid LAD with implantation of a long 3.0 x 48 mm Synergy XD DES postdilated with a 3.5 mm Ashford balloon, stenosis reduced from 95% to 0% with TIMI II to TIMI-3 flow at the end of the procedure.     Recommendation: DAPT for 1 year. He will be watched for 4 any hemodynamic compromise, echocardiogram to be obtained, follow-up with renal function. 60 mL contrast utilized.  __________   Treadmill MPI 04/05/2015 Gavin Potters): Normal treadmill EKG without evidence of ischemia or arrhythmia  Normal myocardial perfusion without evidence of myocardial ischemia  __________   2D echo 04/05/2015 Gavin Potters): AORTIC ROOT          Size:Normal    Dissection:No dissection       AO Note:Sinus of valsalve aneurysm   AORTIC VALVE      Leaflets:Tricuspid         Morphology:Normal      Mobility:Fully mobile   LEFT VENTRICLE          Size:Normal              Anterior:Normal   Contraction:Normal               Lateral:Normal     Closest EF:>55% (Estimated)      Septal:Normal     LV Masses:No Masses             Apical:Normal           ZOX:WRUE LVH            Inferior:Normal                                  Posterior:Normal  Dias.FxClass:N/A   MITRAL VALVE      Leaflets:Normal              Mobility:Fully mobile    Morphology:Normal BOTH MILD   LEFT ATRIUM          Size:Normal             LA Masses:No masses     IA Septum:Normal IAS   MAIN PA          Size:Normal   PULMONIC VALVE    Morphology:Normal              Mobility:Fully mobile   RIGHT VENTRICLE     RV Masses:No Masses               Size:MILDLY ENLARGED     Free Wall:Normal           Contraction:Normal   TRICUSPID VALVE      Leaflets:Normal              Mobility:Fully mobile    Morphology:Normal   RIGHT ATRIUM          Size:MILDLY ENLARGED     RA Other:None       RA Mass:No masses   PERICARDIUM        Fluid:No effusion  INFERIOR VENACAVA          Size:Normal Normal respiratory collapse    DOPPLER ECHO and OTHER SPECIAL PROCEDURES     Aortic:MILD AR                       No AS            96.4 cm/sec peak vel          3.7 mmHg peak grad            2.0 mmHg mean grad            2.0 cm^2 by DOPPLER      Mitral:MODERATE MR                   No MS            91.8 cm/sec peak vel          3.4 mmHg peak grad            1.0 mmHg mean grad            1.8 cm^2 by DOPPLER            MV Inflow E Vel=67.1 cm/sec   MV Annulus E'Vel=nm*            E/E'Ratio=nm*   Tricuspid:MODERATE TR                   No TS            302.0 cm/sec peak TR vel      46.5 mmHg peak RV pressure   Pulmonary:MILD PR                       No PS   INTERPRETATION  NORMAL LEFT VENTRICULAR SYSTOLIC FUNCTION  NORMAL RIGHT VENTRICULAR SYSTOLIC FUNCTION  MODERATE VALVULAR REGURGITATION (See above)  NO VALVULAR STENOSIS     EKG:  EKG is ordered today.  The EKG ordered today demonstrates NSR, 64 bpm, left axis deviation, prior septal infarct, improved inferolateral T  wave changes and supraventricular ectopy, no acute ST/T changes  Recent Labs: 08/28/2023: ALT 35 09/04/2023: Magnesium 2.7 09/06/2023: BUN 16; Creatinine, Ser 1.20; Hemoglobin 13.1; Platelets 254; Potassium 4.6; Sodium 140  Recent Lipid Panel    Component Value Date/Time   CHOL 155 08/28/2023 2050   CHOL 188 11/12/2020 1049   TRIG 99 08/28/2023 2050   HDL 51 08/28/2023 2050   HDL 65 11/12/2020 1049   CHOLHDL 3.0 08/28/2023 2050   VLDL 20 08/28/2023 2050   LDLCALC 84 08/28/2023 2050   LDLCALC 113 (H) 11/12/2020 1049    PHYSICAL EXAM:    VS:  BP (!) 104/56 (BP Location: Left Arm, Patient Position: Sitting, Cuff Size: Normal)   Pulse 64   Ht 5\' 7"  (1.702 m)   Wt 110 lb 12.8 oz (50.3 kg)   SpO2 98%   BMI 17.35 kg/m   BMI: Body mass index is 17.35 kg/m.  Physical Exam Vitals reviewed. Nursing note reviewed: Elderly appearing. Constitutional:      Appearance: He is well-developed.  HENT:     Head: Normocephalic and atraumatic.  Eyes:     General:        Right eye: No discharge.        Left eye: No discharge.  Neck:     Vascular: No JVD.  Cardiovascular:     Rate and Rhythm: Normal rate and  regular rhythm.     Heart sounds: Normal heart sounds, S1 normal and S2 normal. Heart sounds not distant. No midsystolic click and no opening snap. No murmur heard.    No friction rub.  Pulmonary:     Effort: Pulmonary effort is normal. No respiratory distress.     Breath sounds: Normal breath sounds. No decreased breath sounds, wheezing, rhonchi or rales.  Chest:     Chest wall: No tenderness.  Abdominal:     General: There is no distension.  Musculoskeletal:     Cervical back: Normal range of motion.     Right lower leg: No edema.     Left lower leg: No edema.  Skin:    General: Skin is warm and dry.     Nails: There is no clubbing.  Neurological:     Mental Status: He is alert and oriented to person, place, and time.  Psychiatric:        Speech: Speech normal.         Behavior: Behavior normal.        Thought Content: Thought content normal.        Judgment: Judgment normal.     Wt Readings from Last 3 Encounters:  10/02/23 110 lb 12.8 oz (50.3 kg)  09/30/23 113 lb 6 oz (51.4 kg)  09/06/23 116 lb (52.6 kg)     ASSESSMENT & PLAN:   CAD involving the native coronary arteries with recent anteroseptal STEMI without angina: He is doing well and without symptoms of angina or cardiac decompensation.  Continue monotherapy with ticagrelor 90 mg twice daily given prior hematuria, after prior discussion with interventional cardiology.  He will otherwise continue secondary prevention including rosuvastatin, metoprolol, losartan, and as needed SL NTG.  For now, plan for medical management of in-stent restenosis of the RCA given lack of anginal symptoms, and with prior bleeding complications.  However, should he develop symptoms concerning for progressive angina, this would need to be considered at that time if we are unable to control symptoms with escalation of antianginal therapy.  He would benefit from cardiac rehab moving forward.  HFrEF secondary to ICM: Euvolemic and well compensated.  Difficult to assess NYHA class with decreased functional status.  Continue current GDMT including losartan 25 mg and Toprol-XL 25 mg.  Relative hypotension precludes escalation of GDMT at this time.  Revisit in follow-up.  Look to repeat limited echo in 2 months following PCI and on maximally tolerated GDMT to evaluate for improvement in cardiomyopathy.  Defer addition of LifeVest given advanced age and significant comorbidities, unlikely to be a candidate for ICD moving forward.  Not requiring a standing loop diuretic.  History of bladder cancer status post total cystectomy with ileal conduit status post urostomy complicated by hematuria with history of nephrolithiasis: No further hematuria.  CBC with stable hemoglobin.  Ongoing management per urology.  HTN: Blood pressure is  well-controlled in the office today.  Remains on losartan and Toprol-XL as outlined above.  HLD: LDL 84 with target LDL less than 55.  Remains on rosuvastatin 20 mg.  Plan for follow-up fasting lipid panel and LFT at next visit with recommendation to escalate lipid-lowering therapy as indicated to achieve target LDL.  Frequent PACs: Longer evident on EKG. remains on Toprol-XL 25 mg.    Disposition: F/u with Dr. Kirke Corin or an APP in 1 month to assess for escalation of GDMT.   Medication Adjustments/Labs and Tests Ordered: Current medicines are reviewed at length with the patient  today.  Concerns regarding medicines are outlined above. Medication changes, Labs and Tests ordered today are summarized above and listed in the Patient Instructions accessible in Encounters.   Signed, Eula Listen, PA-C 10/02/2023 12:59 PM     [facility] - [place] [address] [place], [place] [address] [phone]

## 2023-10-02 ENCOUNTER — Encounter: Payer: Self-pay | Admitting: Physician Assistant

## 2023-10-02 ENCOUNTER — Ambulatory Visit: Payer: Medicare HMO | Attending: Physician Assistant | Admitting: Physician Assistant

## 2023-10-02 VITALS — BP 104/56 | HR 64 | Ht 67.0 in | Wt 110.8 lb

## 2023-10-02 DIAGNOSIS — E785 Hyperlipidemia, unspecified: Secondary | ICD-10-CM

## 2023-10-02 DIAGNOSIS — Z8551 Personal history of malignant neoplasm of bladder: Secondary | ICD-10-CM

## 2023-10-02 DIAGNOSIS — I2102 ST elevation (STEMI) myocardial infarction involving left anterior descending coronary artery: Secondary | ICD-10-CM

## 2023-10-02 DIAGNOSIS — I2510 Atherosclerotic heart disease of native coronary artery without angina pectoris: Secondary | ICD-10-CM

## 2023-10-02 DIAGNOSIS — I255 Ischemic cardiomyopathy: Secondary | ICD-10-CM | POA: Diagnosis not present

## 2023-10-02 DIAGNOSIS — I5020 Unspecified systolic (congestive) heart failure: Secondary | ICD-10-CM | POA: Diagnosis not present

## 2023-10-02 DIAGNOSIS — Z9889 Other specified postprocedural states: Secondary | ICD-10-CM

## 2023-10-02 DIAGNOSIS — I491 Atrial premature depolarization: Secondary | ICD-10-CM | POA: Diagnosis not present

## 2023-10-02 DIAGNOSIS — I10 Essential (primary) hypertension: Secondary | ICD-10-CM | POA: Diagnosis not present

## 2023-10-02 NOTE — Patient Instructions (Signed)
Medication Instructions:  - none ordered  *If you need a refill on your cardiac medications before your next appointment, please call your pharmacy*   Lab Work: - none ordered  If you have labs (blood work) drawn today and your tests are completely normal, you will receive your results only by: MyChart Message (if you have MyChart) OR A paper copy in the mail If you have any lab test that is abnormal or we need to change your treatment, we will call you to review the results.   Testing/Procedures: - none ordered   Follow-Up: At [facility], you and your health needs are our priority.  As part of our continuing mission to provide you with exceptional heart care, we have created designated Provider Care Teams.  These Care Teams include your primary Cardiologist (physician) and Advanced Practice Providers (APPs -  Physician Assistants and Nurse Practitioners) who all work together to provide you with the care you need, when you need it.  We recommend signing up for the patient portal called "MyChart".  Sign up information is provided on this After Visit Summary.  MyChart is used to connect with patients for Virtual Visits (Telemedicine).  Patients are able to view lab/test results, encounter notes, upcoming appointments, etc.  Non-urgent messages can be sent to your provider as well.   To learn more about what you can do with MyChart, go to [email or web address].    Your next appointment:   1 month(s)  Provider:   - Eula Listen, PA-C   Other Instructions N/a

## 2023-10-30 ENCOUNTER — Encounter: Payer: Self-pay | Admitting: Physician Assistant

## 2023-10-30 ENCOUNTER — Ambulatory Visit: Payer: Medicare HMO | Attending: Physician Assistant | Admitting: Physician Assistant

## 2023-10-30 VITALS — BP 104/50 | HR 71 | Ht 67.0 in | Wt 113.5 lb

## 2023-10-30 DIAGNOSIS — I491 Atrial premature depolarization: Secondary | ICD-10-CM

## 2023-10-30 DIAGNOSIS — Z8551 Personal history of malignant neoplasm of bladder: Secondary | ICD-10-CM | POA: Diagnosis not present

## 2023-10-30 DIAGNOSIS — I2510 Atherosclerotic heart disease of native coronary artery without angina pectoris: Secondary | ICD-10-CM | POA: Diagnosis not present

## 2023-10-30 DIAGNOSIS — I10 Essential (primary) hypertension: Secondary | ICD-10-CM | POA: Diagnosis not present

## 2023-10-30 DIAGNOSIS — Z9889 Other specified postprocedural states: Secondary | ICD-10-CM

## 2023-10-30 DIAGNOSIS — I2102 ST elevation (STEMI) myocardial infarction involving left anterior descending coronary artery: Secondary | ICD-10-CM

## 2023-10-30 DIAGNOSIS — E785 Hyperlipidemia, unspecified: Secondary | ICD-10-CM | POA: Diagnosis not present

## 2023-10-30 DIAGNOSIS — I5020 Unspecified systolic (congestive) heart failure: Secondary | ICD-10-CM | POA: Diagnosis not present

## 2023-10-30 DIAGNOSIS — I255 Ischemic cardiomyopathy: Secondary | ICD-10-CM

## 2023-10-30 DIAGNOSIS — Z79899 Other long term (current) drug therapy: Secondary | ICD-10-CM

## 2023-10-30 NOTE — Progress Notes (Signed)
Cardiology Office Note    Date:  10/30/2023   ID:  Terry [name], [place] 21-Mar-1936, MRN [id number]  PCP:  Wilford Corner, PA-C  Cardiologist:  Evaluated by Dr. Jacinto Halim during admission  Electrophysiologist:  None   Chief Complaint: Follow up  History of Present Illness:   Terry [name] is a 87 y.o. male with history of CAD status post prior RCA stenting with anteroseptal STEMI status post PCI/DES to the proximal and mid LAD with a long 3.0 x 48 mm Synergy XD drug-eluting stent, HFrEF secondary to ICM, bladder cancer status post total cystectomy with ileal conduit with urostomy complicated by hematuria, HTN, HLD, GI bleed in 2018, venous insufficiency, and nephrolithiasis who presents for follow-up of his CAD and cardiomyopathy.   He was previously evaluated by Dr. Gwen Pounds with [facility] cardiology with treadmill MPI on 04/05/2015 showing no evidence of ischemia or arrhythmia with normal myocardial perfusion.  Echo at that time showed an EF greater than 55%, normal wall motion, mild LVH, normal RV systolic function with mildly enlarged ventricular cavity size, mild aortic insufficiency, moderate mitral regurgitation, and moderate tricuspid regurgitation.  He was lost to cardiology thereafter.   He presented to [facility] on 08/28/2023 with chest pain and was found to have anteroseptal ST elevation MI.  He was transferred to [facility] and admitted from 9/13 through 9/14 with anteroseptal STEMI.  Emergent LHC by Dr. Jacinto Halim demonstrated proximal to mid LAD stenosis of 95% treated successfully with PCI/DES.  There was also 50 to 60% OM1 stenosis, and 60 to 70% in-stent restenosis of previously placed proximal RCA stent.  Echo post procedure showed an EF of 30 to 35%, akinesis of the entire LV anterior wall, apical segment, and septal wall, moderate concentric LVH, grade 1 diastolic dysfunction, elevated LVEDP, normal RV systolic function and ventricular cavity size, mildly  dilated left atrium, mild mitral regurgitation, and an estimated right atrial pressure of 3 mmHg.   He was seen in hospital follow-up on 09/04/2023 and was without symptoms of angina or cardiac decompensation.  Chronic dyspnea was unchanged.  On the morning of his appointment he noted hematuria in his urostomy bag.  His daughter reported this had intermittently happened in the past with renal stones.  Hemoglobin stable at that time.  He was advised to follow-up with his urologist.  He was subsequently evaluated in the ED on 09/06/2023 with ongoing hematuria.  Hemoglobin was again stable.  He was placed on Keflex for potential UTI and advised to follow-up with urology.  He was evaluated by Urology on 09/30/2023 and reported that his hematuria resolved after antibiotic with likelihood of malignancy recurrence felt to be low based on prior imaging.  He was most recently seen in the office on 10/02/2023 and remained without symptoms of angina or cardiac decompensation.  Chronic dyspnea, that predated his MI, was unchanged.  No further hematuria in urostomy bag.  Adherent to ticagrelor.  Relative hypotension precluded escalation of GDMT.  Comes in accompanied by his daughter today and is without symptoms of angina or cardiac decompensation.  Chronic dyspnea is unchanged and predates his MI.  No falls, hematochezia, or melena.  No dizziness, presyncope, or syncope.  No further hematuria in his urostomy bag.  No lower extremity swelling, abdominal distention, orthopnea.  He has been dealing with some medial left knee discomfort following a fall that occurred last year.  Remains adherent to ticagrelor.  Patient and family do not have any acute cardiac concerns  at this time.   Labs independently reviewed: 08/2023 - Hgb 13.1, PLT 254, potassium 4.6, BUN 16, serum creatinine 1.2, LP(a) 46, TC 155, TG 99, HDL 51, LDL 84, albumin 3.6, AST 51, ALT normal, A1c 5.2, magnesium 2.7 02/2023 - TSH normal  Past Medical History:   Diagnosis Date   Bullous pemphigoid    CAD (coronary artery disease)    Cancer (HCC)    bladder cancer   Hyperlipidemia    Hypertension    Ischemic cardiomyopathy    Kidney stones    Nephrolithiasis    Status post surgical removal and fulguration of bladder neoplasm     Past Surgical History:  Procedure Laterality Date   BLADDER SURGERY  06/2015   CORONARY/GRAFT ACUTE MI REVASCULARIZATION N/A 08/28/2023   Procedure: Coronary/Graft Acute MI Revascularization;  Surgeon: Yates Decamp, MD;  Location: [facility] INVASIVE CV LAB;  Service: Cardiovascular;  Laterality: N/A;   deviated nose septum surgery  1970   EP IMPLANTABLE DEVICE     HERNIA REPAIR  1980   single septum port     stomach ulcer surgery  1994   vascular stent  2006    Current Medications: Current Meds  Medication Sig   acetaminophen (TYLENOL) 325 MG tablet Take 650 mg by mouth every 6 (six) hours as needed for mild pain.   cetirizine (ZYRTEC) 10 MG tablet Take 10 mg by mouth daily.   diphenhydramine-acetaminophen (TYLENOL PM) 25-500 MG TABS tablet Take 1 tablet by mouth at bedtime as needed (for sleep).   losartan (COZAAR) 25 MG tablet Take 1 tablet (25 mg total) by mouth daily.   metoprolol succinate (TOPROL-XL) 25 MG 24 hr tablet Take 1 tablet (25 mg total) by mouth daily.   neomycin-bacitracin-polymyxin (NEOSPORIN) [phone] ointment Apply 1 application topically as needed.   nitroGLYCERIN (NITROSTAT) 0.4 MG SL tablet Place 1 tablet (0.4 mg total) under the tongue every 5 (five) minutes x 3 doses as needed for chest pain.   ondansetron (ZOFRAN) 4 MG tablet Take 1 tablet (4 mg total) by mouth daily as needed for nausea or vomiting.   Ostomy Supplies (SENSURA DRAINABLE) Pouch MISC Ostomy supplies as needed. Diagnosis: Bladder Cancer (C67.9)   rosuvastatin (CRESTOR) 20 MG tablet Take 1 tablet (20 mg total) by mouth daily.   Skin Protectants, Misc. (CAVILON NO STING BARRIER FILM EX) Apply topically as needed.   ticagrelor  (BRILINTA) 90 MG TABS tablet Take 1 tablet (90 mg total) by mouth 2 (two) times daily.   triamcinolone cream (KENALOG) 0.1 % Apply 1 application topically 2 (two) times daily.    VITAMIN D PO Take by mouth daily in the afternoon.    Allergies:   Triprolidine-pseudoephedrine, Triprolidine-pseudoephedrine, Dapsone, Sudafed pe cold-cough  [phenylephrine-dm-gg-apap], and Actifed cold-allergy  [chlorpheniramine-phenylephrine]   Social History   Socioeconomic History   Marital status: Widowed    Spouse name: Not on file   Number of children: 3   Years of education: Not on file   Highest education level: High school graduate  Occupational History   Occupation: retired  Tobacco Use   Smoking status: Former    Current packs/day: 0.00    Average packs/day: 1.5 packs/day for 50.0 years (75.0 ttl pk-yrs)    Types: Cigarettes    Start date: 06/19/1955    Quit date: 06/18/2005    Years since quitting: 18.3   Smokeless tobacco: Former    Types: Engineer, drilling   Vaping status: Never Used  Substance and Sexual Activity  Alcohol use: No    Alcohol/week: 0.0 standard drinks of alcohol   Drug use: No   Sexual activity: Not on file  Other Topics Concern   Not on file  Social History Narrative   Not on file   Social Determinants of Health   Financial Resource Strain: Low Risk  (08/27/2023)   Received from [facility]   Overall Financial Resource Strain (CARDIA)    Difficulty of Paying Living Expenses: Not hard at all  Food Insecurity: No Food Insecurity (08/27/2023)   Received from [facility]   Hunger Vital Sign    Worried About Running Out of Food in the Last Year: Never true    Ran Out of Food in the Last Year: Never true  Transportation Needs: No Transportation Needs (08/27/2023)   Received from [facility] - Transportation    In the past 12 months, has lack of transportation kept you from medical appointments or from  getting medications?: No    Lack of Transportation (Non-Medical): No  Physical Activity: Inactive (02/01/2020)   Exercise Vital Sign    Days of Exercise per Week: 0 days    Minutes of Exercise per Session: 0 min  Stress: No Stress Concern Present (02/01/2020)   [employer] of Occupational Health - Occupational Stress Questionnaire    Feeling of Stress : Not at all  Social Connections: Moderately Integrated (02/01/2020)   Social Connection and Isolation Panel [NHANES]    Frequency of Communication with Friends and Family: More than three times a week    Frequency of Social Gatherings with Friends and Family: More than three times a week    Attends Religious Services: More than 4 times per year    Active Member of [employer] or Organizations: Yes    Attends Banker Meetings: More than 4 times per year    Marital Status: Widowed     Family History:  The patient's family history includes Alzheimer's disease in his father; Diabetes in his mother; Heart disease in his mother; Hip fracture in his father; Hypertension in his father; Prostate cancer in his paternal uncle.  ROS:   12-point review of systems is negative unless otherwise noted in the HPI.   EKGs/Labs/Other Studies Reviewed:    Studies reviewed were summarized above. The additional studies were reviewed today:  2D echo 08/29/2023: 1. Left ventricular ejection fraction, by estimation, is 30 to 35%. The  left ventricle has moderately decreased function. The left ventricle  demonstrates regional wall motion abnormalities (see scoring  diagram/findings for description). There is  moderate concentric left ventricular hypertrophy. Left ventricular  diastolic parameters are consistent with Grade I diastolic dysfunction  (impaired relaxation). Elevated left ventricular end-diastolic pressure.  There is akinesis of the left ventricular,  entire anterior wall, apical segment and septal wall.   2. Right ventricular systolic  function is normal. The right ventricular  size is normal.   3. Left atrial size was mildly dilated.   4. The mitral valve is normal in structure. Mild mitral valve  regurgitation. No evidence of mitral stenosis.   5. The aortic valve is normal in structure. Aortic valve regurgitation is  not visualized. No aortic stenosis is present.   6. The inferior vena cava is normal in size with greater than 50%  respiratory variability, suggesting right atrial pressure of 3 mmHg.  __________   LHC 08/28/2023:   Prox LAD to Mid LAD lesion is 95% stenosed.  Post intervention, there is a 0% residual stenosis.   Left Heart Catheterization 08/28/23:    Hemodynamic data:  LV 111/8, EDP 23 mmHg.  Ao 116/59, mean 85 mmHg.  There was no pressure gradient across the aortic valve.   Angiographic data: LV: Anterolateral severe hypokinesis. LVEF 35% LM: Large-caliber vessel.  Smooth and normal. LCx: It is codominant.  Gives origin to large OM1.  OM1 has a proximal 50 to 60% stenosis. LAD: Is a large-caliber vessel.  It is moderately calcified.  Proximal and mid segment has a ulcerated 95% stenosis.  There is TIMI II flow. RCA: Codominant vessel with LCx.  There is a RCA stent in the proximal segment with in-stent restenosis of 60% to 70%.  Otherwise distal RCA and PDA appear to be smooth and normal.     Intervention data: Successful PTCA and stenting of the proximal and mid LAD with implantation of a long 3.0 x 48 mm Synergy XD DES postdilated with a 3.5 mm D'Hanis balloon, stenosis reduced from 95% to 0% with TIMI II to TIMI-3 flow at the end of the procedure.     Recommendation: DAPT for 1 year. He will be watched for 4 any hemodynamic compromise, echocardiogram to be obtained, follow-up with renal function. 60 mL contrast utilized.  __________   Treadmill MPI 04/05/2015 Gavin Potters): Normal treadmill EKG without evidence of ischemia or arrhythmia  Normal myocardial perfusion without evidence of myocardial  ischemia  __________   2D echo 04/05/2015 Gavin Potters): AORTIC ROOT          Size:Normal    Dissection:No dissection       AO Note:Sinus of valsalve aneurysm   AORTIC VALVE      Leaflets:Tricuspid         Morphology:Normal      Mobility:Fully mobile   LEFT VENTRICLE          Size:Normal              Anterior:Normal   Contraction:Normal               Lateral:Normal    Closest EF:>55% (Estimated)      Septal:Normal     LV Masses:No Masses             Apical:Normal           ZOX:WRUE LVH            Inferior:Normal                                  Posterior:Normal  Dias.FxClass:N/A   MITRAL VALVE      Leaflets:Normal              Mobility:Fully mobile    Morphology:Normal BOTH MILD   LEFT ATRIUM          Size:Normal             LA Masses:No masses     IA Septum:Normal IAS   MAIN PA          Size:Normal   PULMONIC VALVE    Morphology:Normal              Mobility:Fully mobile   RIGHT VENTRICLE     RV Masses:No Masses               Size:MILDLY ENLARGED     Free Wall:Normal           Contraction:Normal   TRICUSPID VALVE  Leaflets:Normal              Mobility:Fully mobile    Morphology:Normal   RIGHT ATRIUM          Size:MILDLY ENLARGED     RA Other:None       RA Mass:No masses   PERICARDIUM        Fluid:No effusion   INFERIOR VENACAVA          Size:Normal Normal respiratory collapse    DOPPLER ECHO and OTHER SPECIAL PROCEDURES     Aortic:MILD AR                       No AS            96.4 cm/sec peak vel          3.7 mmHg peak grad            2.0 mmHg mean grad            2.0 cm^2 by DOPPLER      Mitral:MODERATE MR                   No MS            91.8 cm/sec peak vel          3.4 mmHg peak grad            1.0 mmHg mean grad            1.8 cm^2 by DOPPLER            MV Inflow E Vel=67.1 cm/sec   MV Annulus E'Vel=nm*            E/E'Ratio=nm*   Tricuspid:MODERATE TR                   No TS            302.0 cm/sec peak TR vel      46.5 mmHg peak RV pressure    Pulmonary:MILD PR                       No PS   INTERPRETATION  NORMAL LEFT VENTRICULAR SYSTOLIC FUNCTION  NORMAL RIGHT VENTRICULAR SYSTOLIC FUNCTION  MODERATE VALVULAR REGURGITATION (See above)  NO VALVULAR STENOSIS    EKG:  EKG is not ordered today.    Recent Labs: 08/28/2023: ALT 35 09/04/2023: Magnesium 2.7 09/06/2023: BUN 16; Creatinine, Ser 1.20; Hemoglobin 13.1; Platelets 254; Potassium 4.6; Sodium 140  Recent Lipid Panel    Component Value Date/Time   CHOL 155 08/28/2023 2050   CHOL 188 11/12/2020 1049   TRIG 99 08/28/2023 2050   HDL 51 08/28/2023 2050   HDL 65 11/12/2020 1049   CHOLHDL 3.0 08/28/2023 2050   VLDL 20 08/28/2023 2050   LDLCALC 84 08/28/2023 2050   LDLCALC 113 (H) 11/12/2020 1049    PHYSICAL EXAM:    VS:  BP (!) 104/50 (BP Location: Left Arm, Patient Position: Sitting, Cuff Size: Normal)   Pulse 71   Ht 5\' 7"  (1.702 m)   Wt 113 lb 8 oz (51.5 kg)   SpO2 98%   BMI 17.78 kg/m   BMI: Body mass index is 17.78 kg/m.  Physical Exam Vitals reviewed.  Constitutional:      Appearance: He is well-developed.  HENT:     Head: Normocephalic and atraumatic.  Eyes:     General:        Right  eye: No discharge.        Left eye: No discharge.  Neck:     Vascular: No JVD.  Cardiovascular:     Rate and Rhythm: Normal rate and regular rhythm.     Heart sounds: Normal heart sounds, S1 normal and S2 normal. Heart sounds not distant. No midsystolic click and no opening snap. No murmur heard.    No friction rub.  Pulmonary:     Effort: Pulmonary effort is normal. No respiratory distress.     Breath sounds: Normal breath sounds. No decreased breath sounds, wheezing, rhonchi or rales.  Chest:     Chest wall: No tenderness.  Abdominal:     General: There is no distension.  Musculoskeletal:     Cervical back: Normal range of motion.     Right lower leg: No edema.     Left lower leg: No edema.  Skin:    General: Skin is warm and dry.     Nails: There is no  clubbing.  Neurological:     Mental Status: He is alert and oriented to person, place, and time.  Psychiatric:        Speech: Speech normal.        Behavior: Behavior normal.        Thought Content: Thought content normal.        Judgment: Judgment normal.     Wt Readings from Last 3 Encounters:  10/30/23 113 lb 8 oz (51.5 kg)  10/02/23 110 lb 12.8 oz (50.3 kg)  09/30/23 113 lb 6 oz (51.4 kg)     ASSESSMENT & PLAN:   CAD involving the native coronary arteries with recent anteroseptal STEMI without angina: He continues to do well and is without symptoms of angina or cardiac decompensation.  Continue monotherapy with ticagrelor 90 mg twice daily given prior hematuria, after prior discussion with interventional cardiology.  He will otherwise continue secondary prevention including rosuvastatin, metoprolol succinate, losartan, and as needed SL NTG.  For now, plan for medical management of in-stent restenosis of RCA given lack of anginal symptoms, advanced age, and prior bleeding complications.  However, should he develop symptoms concerning for progressive angina, despite escalation of antianginal therapy, PCI would need to be considered.  HFrEF secondary to ICM: Euvolemic and well compensated.  Difficult to assess NYHA class secondary to decreased functional status.  Continue current GDMT including losartan 25 mg and Toprol-XL 25 mg.  Relative hypotension precludes escalation of GDMT at this time.  Not currently requiring a standing loop diuretic.  Follow-up limited echo in 1 month following PCI, and on maximally tolerated GDMT for improvement in cardiomyopathy.  Defer addition of LifeVest given advanced age and significant comorbidities.  Unlikely to be a candidate for ICD moving forward.  History of bladder cancer status post total cystectomy with ileal conduit status post urostomy: No further hematuria.  Ongoing management per urology.  HTN: Blood pressure is well-controlled in the office  today.  Continue medical therapy as outlined above.  HLD: LDL of 84 with target LDL less than 55.  Remains on rosuvastatin 20 mg.  Future orders placed for fasting lipid panel and CMP to be obtained at time of limited echo next month.  Frequent PACs: Asymptomatic.  Remains on Toprol-XL as outlined above.     Disposition: F/u with Dr. Kirke Corin or an APP after limited echo.   Medication Adjustments/Labs and Tests Ordered: Current medicines are reviewed at length with the patient today.  Concerns regarding medicines are outlined  above. Medication changes, Labs and Tests ordered today are summarized above and listed in the Patient Instructions accessible in Encounters.   Signed, Eula Listen, PA-C 10/30/2023 3:43 PM     [facility] [place] [address] [place], [place] [address] [phone]

## 2023-10-30 NOTE — Patient Instructions (Addendum)
Medication Instructions:  Your Physician recommend you continue on your current medication as directed.    *If you need a refill on your cardiac medications before your next appointment, please call your pharmacy*   Lab Work: Your provider would like for you to return at the same time as your Limited Echo to have the following labs drawn: CMT and Fasting Lipid.   Please go to [facility] [address] ([facility]) [address], [place] [address] You do not need an appointment.  They are open from 7:30 am-4 pm.  Lunch from 1:00 pm- 2:00 pm You DO need to be fasting.    If you have labs (blood work) drawn today and your tests are completely normal, you will receive your results only by: MyChart Message (if you have MyChart) OR A paper copy in the mail If you have any lab test that is abnormal or we need to change your treatment, we will call you to review the results.   Testing/Procedures: Your physician has requested that you have an LIMITED echocardiogram. Echocardiography is a painless test that uses sound waves to create images of your heart. It provides your doctor with information about the size and shape of your heart and how well your heart's chambers and valves are working.   You may receive an ultrasound enhancing agent through an IV if needed to better visualize your heart during the echo. This procedure takes approximately one hour.  There are no restrictions for this procedure.  This will take place at [address] [facility]) [address], [place] [address]  Please note: We ask at that you not bring children with you during ultrasound (echo/ vascular) testing. Due to room size and safety concerns, children are not allowed in the ultrasound rooms during exams. Our front office staff cannot provide observation of children in our lobby area while testing is being conducted. An adult accompanying a patient to their appointment will only be allowed in  the ultrasound room at the discretion of the ultrasound technician under special circumstances. We apologize for any inconvenience.   Follow-Up: At [facility], you and your health needs are our priority.  As part of our continuing mission to provide you with exceptional heart care, we have created designated Provider Care Teams.  These Care Teams include your primary Cardiologist (physician) and Advanced Practice Providers (APPs -  Physician Assistants and Nurse Practitioners) who all work together to provide you with the care you need, when you need it.  We recommend signing up for the patient portal called "MyChart".  Sign up information is provided on this After Visit Summary.  MyChart is used to connect with patients for Virtual Visits (Telemedicine).  Patients are able to view lab/test results, encounter notes, upcoming appointments, etc.  Non-urgent messages can be sent to your provider as well.   To learn more about what you can do with MyChart, go to [email or web address].    Your next appointment:   About 1 week(s) after ECHOCARDIOGRAM  Provider:   You may see Eula Listen, PA-C

## 2023-11-04 DIAGNOSIS — C7911 Secondary malignant neoplasm of bladder: Secondary | ICD-10-CM | POA: Diagnosis not present

## 2023-11-04 DIAGNOSIS — Z936 Other artificial openings of urinary tract status: Secondary | ICD-10-CM | POA: Diagnosis not present

## 2023-12-07 ENCOUNTER — Ambulatory Visit: Payer: Medicare HMO | Attending: Physician Assistant

## 2023-12-07 DIAGNOSIS — I255 Ischemic cardiomyopathy: Secondary | ICD-10-CM

## 2023-12-07 LAB — ECHOCARDIOGRAM LIMITED
Area-P 1/2: 3.42 cm2
S' Lateral: 2.9 cm

## 2023-12-24 ENCOUNTER — Other Ambulatory Visit: Payer: Self-pay | Admitting: Physician Assistant

## 2023-12-31 ENCOUNTER — Telehealth: Payer: Self-pay | Admitting: Physician Assistant

## 2023-12-31 MED ORDER — TICAGRELOR 90 MG PO TABS: 90.0000 mg | ORAL_TABLET | Freq: Two times a day (BID) | ORAL | 0 refills | Status: DC

## 2023-12-31 NOTE — Telephone Encounter (Signed)
The patient's daughter called to report that she received a message from [facility] stating they have not received an updated prescription for Brilinta. The nurse informed the daughter that the prescription was sent on 12/24/23, but a new prescription would be sent. The daughter was advised to contact the office if there are any further issues.

## 2023-12-31 NOTE — Telephone Encounter (Signed)
Pt c/o medication issue:  1. Name of Medication:   ticagrelor (BRILINTA) 90 MG TABS tablet    2. How are you currently taking this medication (dosage and times per day)? As written  3. Are you having a reaction (difficulty breathing--STAT)? no  4. What is your medication issue? Pharmacy told her were have not sent script.  It looks like it was sent so maybe it needs prior auth. Daughter woulld like a call back

## 2024-01-06 NOTE — Progress Notes (Signed)
Cardiology Office Note    Date:  01/08/2024   ID:  Terry [name] [place], [name] [birth year], MRN [id number]  PCP:  Wilford Corner, PA-C  Cardiologist:  Evaluated by Dr. Jacinto Halim during admission  Electrophysiologist:  None   Chief Complaint: Follow-up  History of Present Illness:   Terry [name] is a 88 y.o. male with history of CAD status post prior RCA stenting with anteroseptal STEMI status post PCI/DES to the proximal and mid LAD with a long 3.0 x 48 mm Synergy XD drug-eluting stent, HFimpEF secondary to ICM, bladder cancer status post total cystectomy with ileal conduit with urostomy complicated by hematuria, HTN, HLD, GI bleed in 2018, venous insufficiency, and nephrolithiasis who presents for follow-up of his CAD and cardiomyopathy.   He was previously evaluated by Dr. Gwen Pounds with [facility] cardiology with treadmill MPI on 04/05/2015 showing no evidence of ischemia or arrhythmia with normal myocardial perfusion.  Echo at that time showed an EF greater than 55%, normal wall motion, mild LVH, normal RV systolic function with mildly enlarged ventricular cavity size, mild aortic insufficiency, moderate mitral regurgitation, and moderate tricuspid regurgitation.  He was lost to cardiology thereafter.   He presented to [facility] on 08/28/2023 with chest pain and was found to have anteroseptal ST elevation MI.  He was transferred to [facility] and admitted from 9/13 through 9/14 with anteroseptal STEMI.  Emergent LHC by Dr. Jacinto Halim demonstrated proximal to mid LAD stenosis of 95% treated successfully with PCI/DES.  There was also 50 to 60% OM1 stenosis, and 60 to 70% in-stent restenosis of previously placed proximal RCA stent.  Echo post procedure showed an EF of 30 to 35%, akinesis of the entire LV anterior wall, apical segment, and septal wall, moderate concentric LVH, grade 1 diastolic dysfunction, elevated LVEDP, normal RV systolic function and ventricular cavity size, mildly  dilated left atrium, mild mitral regurgitation, and an estimated right atrial pressure of 3 mmHg.   He was seen in hospital follow-up on 09/04/2023 and was without symptoms of angina or cardiac decompensation.  Chronic dyspnea was unchanged.  On the morning of his appointment he noted hematuria in his urostomy bag.  His daughter reported this had intermittently happened in the past with renal stones.  Hemoglobin stable at that time.  He was advised to follow-up with his urologist.  He was subsequently evaluated in the ED on 09/06/2023 with ongoing hematuria.  Hemoglobin was again stable.  He was placed on Keflex for potential UTI and advised to follow-up with urology.  He was evaluated by Urology on 09/30/2023 and reported that his hematuria resolved after antibiotic with likelihood of malignancy recurrence felt to be low based on prior imaging.  He was seen in the office on 10/02/2023 and reported no further hematuria in urostomy bag.  He was last seen in the office in 10/2023 and remained without symptoms of angina or cardiac decompensation.  Chronic dyspnea was unchanged and predated his MI.  No further hematuria.  Limited echo in 11/2023 showed a pump function of 50 to 55%, no regional wall motion normalities, grade 1 diastolic dysfunction, normal RV systolic function and ventricular cavity size, mild mitral regurgitation, mild dilatation of aortic root measuring 41 mm, and an estimated right atrial pressure of 3 mmHg.  He comes in accompanied by his daughter today he reports exertional shortness of breath and significant fatigue.  He denies symptoms of chest pain.  There is some associated dizziness without presyncope or syncope.  No  lower extremity swelling, abdominal distention, or progressive orthopnea.  The patient's daughter reports his appetite has not significantly diminished.  He is not eating or drinking well.  His weight is down 12 pounds today by our scale when compared to his visit in 10/2023.  He  denies any further hematuria, though his daughter reports his urostomy bag shows dark urine.  Blood pressure was also noted to be low at PCP visit earlier today in the 90s systolic.  The patient reports he believes he has taken his losartan and Toprol-XL earlier this morning, though is not certain.  Repeat blood pressure of 72/36 at the end of our examination today.   Labs independently reviewed: 08/2023 - Hgb 13.1, PLT 254, potassium 4.6, BUN 16, serum creatinine 1.2, LP(a) 46, TC 155, TG 99, HDL 51, LDL 84, albumin 3.6, AST 51, ALT normal, A1c 5.2, magnesium 2.7 02/2023 - TSH normal  Past Medical History:  Diagnosis Date   Bullous pemphigoid    CAD (coronary artery disease)    Cancer (HCC)    bladder cancer   Hyperlipidemia    Hypertension    Ischemic cardiomyopathy    Kidney stones    Nephrolithiasis    Status post surgical removal and fulguration of bladder neoplasm     Past Surgical History:  Procedure Laterality Date   BLADDER SURGERY  06/2015   CORONARY/GRAFT ACUTE MI REVASCULARIZATION N/A 08/28/2023   Procedure: Coronary/Graft Acute MI Revascularization;  Surgeon: Yates Decamp, MD;  Location: [facility] INVASIVE CV LAB;  Service: Cardiovascular;  Laterality: N/A;   deviated nose septum surgery  1970   EP IMPLANTABLE DEVICE     HERNIA REPAIR  1980   single septum port     stomach ulcer surgery  1994   vascular stent  2006    Current Medications: Current Meds  Medication Sig   acetaminophen (TYLENOL) 325 MG tablet Take 650 mg by mouth every 6 (six) hours as needed for mild pain.   cetirizine (ZYRTEC) 10 MG tablet Take 10 mg by mouth daily.   diphenhydramine-acetaminophen (TYLENOL PM) 25-500 MG TABS tablet Take 1 tablet by mouth at bedtime as needed (for sleep).   methotrexate (RHEUMATREX) 2.5 MG tablet Take 15 mg by mouth once a week.   neomycin-bacitracin-polymyxin (NEOSPORIN) [phone] ointment Apply 1 application topically as needed.   nitroGLYCERIN (NITROSTAT) 0.4 MG SL tablet  Place 1 tablet (0.4 mg total) under the tongue every 5 (five) minutes x 3 doses as needed for chest pain.   ondansetron (ZOFRAN) 4 MG tablet Take 1 tablet (4 mg total) by mouth daily as needed for nausea or vomiting.   Ostomy Supplies (SENSURA DRAINABLE) Pouch MISC Ostomy supplies as needed. Diagnosis: Bladder Cancer (C67.9)   rosuvastatin (CRESTOR) 20 MG tablet Take 1 tablet (20 mg total) by mouth daily.   Skin Protectants, Misc. (CAVILON NO STING BARRIER FILM EX) Apply topically as needed.   ticagrelor (BRILINTA) 90 MG TABS tablet Take 1 tablet (90 mg total) by mouth 2 (two) times daily.   triamcinolone cream (KENALOG) 0.1 % Apply 1 application topically 2 (two) times daily.    VITAMIN D PO Take by mouth daily in the afternoon.   [DISCONTINUED] losartan (COZAAR) 25 MG tablet Take 1 tablet (25 mg total) by mouth daily.   [DISCONTINUED] metoprolol succinate (TOPROL-XL) 25 MG 24 hr tablet Take 1 tablet (25 mg total) by mouth daily.    Allergies:   Triprolidine-pseudoephedrine, Triprolidine-pseudoephedrine, Dapsone, Sudafed pe cold-cough  [phenylephrine-dm-gg-apap], and Actifed cold-allergy  [chlorpheniramine-phenylephrine]  Social History   Socioeconomic History   Marital status: Widowed    Spouse name: Not on file   Number of children: 3   Years of education: Not on file   Highest education level: High school graduate  Occupational History   Occupation: retired  Tobacco Use   Smoking status: Former    Current packs/day: 0.00    Average packs/day: 1.5 packs/day for 50.0 years (75.0 ttl pk-yrs)    Types: Cigarettes    Start date: 06/19/1955    Quit date: 06/18/2005    Years since quitting: 18.5   Smokeless tobacco: Former    Types: Associate Professor status: Never Used  Substance and Sexual Activity   Alcohol use: No    Alcohol/week: 0.0 standard drinks of alcohol   Drug use: No   Sexual activity: Not on file  Other Topics Concern   Not on file  Social History Narrative    Not on file   Social Drivers of Health   Financial Resource Strain: Low Risk  (08/27/2023)   Received from [facility]   Overall Financial Resource Strain (CARDIA)    Difficulty of Paying Living Expenses: Not hard at all  Food Insecurity: No Food Insecurity (08/27/2023)   Received from [facility]   Hunger Vital Sign    Worried About Running Out of Food in the Last Year: Never true    Ran Out of Food in the Last Year: Never true  Transportation Needs: No Transportation Needs (08/27/2023)   Received from [facility] - Transportation    In the past 12 months, has lack of transportation kept you from medical appointments or from getting medications?: No    Lack of Transportation (Non-Medical): No  Physical Activity: Inactive (02/01/2020)   Exercise Vital Sign    Days of Exercise per Week: 0 days    Minutes of Exercise per Session: 0 min  Stress: No Stress Concern Present (02/01/2020)   [employer] of Occupational Health - Occupational Stress Questionnaire    Feeling of Stress : Not at all  Social Connections: Moderately Integrated (02/01/2020)   Social Connection and Isolation Panel [NHANES]    Frequency of Communication with Friends and Family: More than three times a week    Frequency of Social Gatherings with Friends and Family: More than three times a week    Attends Religious Services: More than 4 times per year    Active Member of [employer] or Organizations: Yes    Attends Banker Meetings: More than 4 times per year    Marital Status: Widowed     Family History:  The patient's family history includes Alzheimer's disease in his father; Diabetes in his mother; Heart disease in his mother; Hip fracture in his father; Hypertension in his father; Prostate cancer in his paternal uncle.  ROS:   12-point review of systems is negative unless otherwise noted in the HPI.   EKGs/Labs/Other Studies Reviewed:     Studies reviewed were summarized above. The additional studies were reviewed today:  Limited echo 12/07/2023: 1. Left ventricular ejection fraction, by estimation, is 50 to 55%. The  left ventricle has low normal function. The left ventricle has no regional  wall motion abnormalities. Left ventricular diastolic parameters are  consistent with Grade I diastolic  dysfunction (impaired relaxation). The average left ventricular global  longitudinal strain is -20.1 %.   2. Right ventricular systolic function is normal.  The right ventricular  size is normal. Tricuspid regurgitation signal is inadequate for assessing  PA pressure.   3. The mitral valve is normal in structure. Mild mitral valve  regurgitation. No evidence of mitral stenosis.   4. The aortic valve is normal in structure. Aortic valve regurgitation is  not visualized. No aortic stenosis is present.   5. There is mild dilatation of the aortic root, measuring 41 mm.   6. The inferior vena cava is normal in size with greater than 50%  respiratory variability, suggesting right atrial pressure of 3 mmHg.  __________  2D echo 08/29/2023: 1. Left ventricular ejection fraction, by estimation, is 30 to 35%. The  left ventricle has moderately decreased function. The left ventricle  demonstrates regional wall motion abnormalities (see scoring  diagram/findings for description). There is  moderate concentric left ventricular hypertrophy. Left ventricular  diastolic parameters are consistent with Grade I diastolic dysfunction  (impaired relaxation). Elevated left ventricular end-diastolic pressure.  There is akinesis of the left ventricular,  entire anterior wall, apical segment and septal wall.   2. Right ventricular systolic function is normal. The right ventricular  size is normal.   3. Left atrial size was mildly dilated.   4. The mitral valve is normal in structure. Mild mitral valve  regurgitation. No evidence of mitral  stenosis.   5. The aortic valve is normal in structure. Aortic valve regurgitation is  not visualized. No aortic stenosis is present.   6. The inferior vena cava is normal in size with greater than 50%  respiratory variability, suggesting right atrial pressure of 3 mmHg.  __________   LHC 08/28/2023:   Prox LAD to Mid LAD lesion is 95% stenosed.   Post intervention, there is a 0% residual stenosis.   Left Heart Catheterization 08/28/23:    Hemodynamic data:  LV 111/8, EDP 23 mmHg.  Ao 116/59, mean 85 mmHg.  There was no pressure gradient across the aortic valve.   Angiographic data: LV: Anterolateral severe hypokinesis. LVEF 35% LM: Large-caliber vessel.  Smooth and normal. LCx: It is codominant.  Gives origin to large OM1.  OM1 has a proximal 50 to 60% stenosis. LAD: Is a large-caliber vessel.  It is moderately calcified.  Proximal and mid segment has a ulcerated 95% stenosis.  There is TIMI II flow. RCA: Codominant vessel with LCx.  There is a RCA stent in the proximal segment with in-stent restenosis of 60% to 70%.  Otherwise distal RCA and PDA appear to be smooth and normal.     Intervention data: Successful PTCA and stenting of the proximal and mid LAD with implantation of a long 3.0 x 48 mm Synergy XD DES postdilated with a 3.5 mm [place] balloon, stenosis reduced from 95% to 0% with TIMI II to TIMI-3 flow at the end of the procedure.     Recommendation: DAPT for 1 year. He will be watched for 4 any hemodynamic compromise, echocardiogram to be obtained, follow-up with renal function. 60 mL contrast utilized.  __________   Treadmill MPI 04/05/2015 Gavin Potters): Normal treadmill EKG without evidence of ischemia or arrhythmia  Normal myocardial perfusion without evidence of myocardial ischemia  __________   2D echo 04/05/2015 Gavin Potters): AORTIC ROOT          Size:Normal    Dissection:No dissection       AO Note:Sinus of valsalve aneurysm   AORTIC VALVE      Leaflets:Tricuspid          Morphology:Normal  Mobility:Fully mobile   LEFT VENTRICLE          Size:Normal              Anterior:Normal   Contraction:Normal               Lateral:Normal    Closest EF:>55% (Estimated)      Septal:Normal     LV Masses:No Masses             Apical:Normal           JYN:WGNF LVH            Inferior:Normal                                  Posterior:Normal  Dias.FxClass:N/A   MITRAL VALVE      Leaflets:Normal              Mobility:Fully mobile    Morphology:Normal BOTH MILD   LEFT ATRIUM          Size:Normal             LA Masses:No masses     IA Septum:Normal IAS   MAIN PA          Size:Normal   PULMONIC VALVE    Morphology:Normal              Mobility:Fully mobile   RIGHT VENTRICLE     RV Masses:No Masses               Size:MILDLY ENLARGED     Free Wall:Normal           Contraction:Normal   TRICUSPID VALVE      Leaflets:Normal              Mobility:Fully mobile    Morphology:Normal   RIGHT ATRIUM          Size:MILDLY ENLARGED     RA Other:None       RA Mass:No masses   PERICARDIUM        Fluid:No effusion   INFERIOR VENACAVA          Size:Normal Normal respiratory collapse    DOPPLER ECHO and OTHER SPECIAL PROCEDURES     Aortic:MILD AR                       No AS            96.4 cm/sec peak vel          3.7 mmHg peak grad            2.0 mmHg mean grad            2.0 cm^2 by DOPPLER      Mitral:MODERATE MR                   No MS            91.8 cm/sec peak vel          3.4 mmHg peak grad            1.0 mmHg mean grad            1.8 cm^2 by DOPPLER            MV Inflow E Vel=67.1 cm/sec   MV Annulus E'Vel=nm*            E/E'Ratio=nm*   Tricuspid:MODERATE TR  No TS            302.0 cm/sec peak TR vel      46.5 mmHg peak RV pressure   Pulmonary:MILD PR                       No PS   INTERPRETATION  NORMAL LEFT VENTRICULAR SYSTOLIC FUNCTION  NORMAL RIGHT VENTRICULAR SYSTOLIC FUNCTION  MODERATE VALVULAR REGURGITATION (See above)  NO  VALVULAR STENOSIS    EKG:  EKG is not ordered today.    Recent Labs: 08/28/2023: ALT 35 09/04/2023: Magnesium 2.7 01/08/2024: BUN 32; Creatinine, Ser 1.48; Hemoglobin 12.1; Platelets 195; Potassium 4.4; Sodium 139  Recent Lipid Panel    Component Value Date/Time   CHOL 155 08/28/2023 2050   CHOL 188 11/12/2020 1049   TRIG 99 08/28/2023 2050   HDL 51 08/28/2023 2050   HDL 65 11/12/2020 1049   CHOLHDL 3.0 08/28/2023 2050   VLDL 20 08/28/2023 2050   LDLCALC 84 08/28/2023 2050   LDLCALC 113 (H) 11/12/2020 1049    PHYSICAL EXAM:    VS:  BP (!) 72/36   Pulse 74   Ht 5\' 7"  (1.702 m)   Wt 101 lb 9.6 oz (46.1 kg)   BMI 15.91 kg/m   BMI: Body mass index is 15.91 kg/m.  Physical Exam Constitutional:      Appearance: He is underweight.     Comments: Elderly and frail-appearing.  HENT:     Head: Normocephalic and atraumatic.  Eyes:     General:        Right eye: No discharge.        Left eye: No discharge.  Neck:     Vascular: No JVD.  Cardiovascular:     Rate and Rhythm: Normal rate and regular rhythm.     Heart sounds: Normal heart sounds, S1 normal and S2 normal. Heart sounds not distant. No midsystolic click and no opening snap. No murmur heard.    No friction rub.  Pulmonary:     Effort: Pulmonary effort is normal. No respiratory distress.     Breath sounds: Normal breath sounds. No decreased breath sounds, wheezing or rales.  Chest:     Chest wall: No tenderness.  Abdominal:     General: There is no distension.     Palpations: Abdomen is soft.     Tenderness: There is no abdominal tenderness.  Musculoskeletal:     Cervical back: Normal range of motion.  Skin:    General: Skin is warm and dry.     Nails: There is no clubbing.  Neurological:     Mental Status: He is alert and oriented to person, place, and time.  Psychiatric:        Speech: Speech normal.        Behavior: Behavior normal.        Thought Content: Thought content normal.        Judgment:  Judgment normal.     Wt Readings from Last 3 Encounters:  01/08/24 101 lb 9.6 oz (46.1 kg)  10/30/23 113 lb 8 oz (51.5 kg)  10/02/23 110 lb 12.8 oz (50.3 kg)     ASSESSMENT & PLAN:   Hypotension and weight loss: Initial blood pressure in the office of 79/42 by triage with repeat blood pressure of 72/36.  This is down from a blood pressure of 92/62 at the patient's PCPs office earlier today.  Etiology of his hypotension is currently unclear, though suspect  this is related to diminished oral intake and dehydration.  However, cannot exclude underlying infection, progressive anemia in the context of his bladder cancer with prior history of hematuria, or alternative etiology.  Given the patient's advanced age and comorbid conditions, I do not feel it is safe for him to be discharged home at this time.  He will be transported to the ED for further evaluation.  Metoprolol and losartan should be held.  CAD involving the native coronary arteries with recent anteroseptal STEMI without angina: He is currently without symptoms of angina or cardiac decompensation.  Continue monotherapy with ticagrelor 90 mg twice daily given prior hematuria, after prior discussion with interventional cardiology. He will otherwise continue secondary prevention including rosuvastatin.  Losartan and metoprolol recommended to be held at this time.  For now, plan for medical management of in-stent restenosis of RCA given lack of anginal symptoms, advanced age, and prior bleeding complications. However, should he develop symptoms concerning for progressive angina, despite escalation of antianginal therapy, PCI would need to be considered.   HFimpEF secondary to ICM: He appears volume depleted.  NYHA class is difficult to assess secondary to advanced age with decreased functional status.  Updated echo, following PCI, showed improvement in EF with low normal LV systolic function.  With hypotension, hold losartan and Toprol-XL.  Not on a  standing loop diuretic.  No indication for LifeVest.  History of bladder cancer status post total cystectomy with ileal conduit status post urostomy: Ongoing management per urology.  HLD: LDL 84 in 08/2023 with target LDL less than 55.  Normal ALT at that time.  Remains on rosuvastatin 20 mg.  Anticipate follow-up fasting lipid panel at next visit, this was deferred today given the above hypotension.    Disposition: F/u with Dr. Kirke Corin after ED evaluation.   Medication Adjustments/Labs and Tests Ordered: Current medicines are reviewed at length with the patient today.  Concerns regarding medicines are outlined above. Medication changes, Labs and Tests ordered today are summarized above and listed in the Patient Instructions accessible in Encounters.   Signed, Eula Listen, PA-C 01/08/2024 4:21 PM     [facility] [place] [address] [place], [place] [address] [phone]

## 2024-01-08 ENCOUNTER — Inpatient Hospital Stay
Admission: EM | Admit: 2024-01-08 | Discharge: 2024-01-11 | DRG: 690 | Disposition: A | Discharge status: Home Health | Payer: Medicare HMO | Attending: Internal Medicine | Admitting: Internal Medicine

## 2024-01-08 ENCOUNTER — Encounter: Payer: Self-pay | Admitting: Physician Assistant

## 2024-01-08 ENCOUNTER — Ambulatory Visit: Payer: Medicare HMO | Attending: Physician Assistant | Admitting: Physician Assistant

## 2024-01-08 ENCOUNTER — Emergency Department: Payer: Medicare HMO

## 2024-01-08 ENCOUNTER — Other Ambulatory Visit: Payer: Self-pay

## 2024-01-08 VITALS — BP 72/36 | HR 74 | Ht 67.0 in | Wt 101.6 lb

## 2024-01-08 DIAGNOSIS — A419 Sepsis, unspecified organism: Principal | ICD-10-CM

## 2024-01-08 DIAGNOSIS — R627 Adult failure to thrive: Secondary | ICD-10-CM | POA: Diagnosis present

## 2024-01-08 DIAGNOSIS — R7401 Elevation of levels of liver transaminase levels: Secondary | ICD-10-CM | POA: Diagnosis not present

## 2024-01-08 DIAGNOSIS — I255 Ischemic cardiomyopathy: Secondary | ICD-10-CM | POA: Diagnosis not present

## 2024-01-08 DIAGNOSIS — N390 Urinary tract infection, site not specified: Secondary | ICD-10-CM | POA: Diagnosis not present

## 2024-01-08 DIAGNOSIS — Z1152 Encounter for screening for COVID-19: Secondary | ICD-10-CM

## 2024-01-08 DIAGNOSIS — B952 Enterococcus as the cause of diseases classified elsewhere: Secondary | ICD-10-CM | POA: Diagnosis present

## 2024-01-08 DIAGNOSIS — Z87891 Personal history of nicotine dependence: Secondary | ICD-10-CM

## 2024-01-08 DIAGNOSIS — L750 Bromhidrosis: Secondary | ICD-10-CM | POA: Diagnosis not present

## 2024-01-08 DIAGNOSIS — Z87442 Personal history of urinary calculi: Secondary | ICD-10-CM

## 2024-01-08 DIAGNOSIS — E878 Other disorders of electrolyte and fluid balance, not elsewhere classified: Secondary | ICD-10-CM | POA: Diagnosis present

## 2024-01-08 DIAGNOSIS — E785 Hyperlipidemia, unspecified: Secondary | ICD-10-CM | POA: Insufficient documentation

## 2024-01-08 DIAGNOSIS — Z8551 Personal history of malignant neoplasm of bladder: Secondary | ICD-10-CM

## 2024-01-08 DIAGNOSIS — Z8249 Family history of ischemic heart disease and other diseases of the circulatory system: Secondary | ICD-10-CM

## 2024-01-08 DIAGNOSIS — Z936 Other artificial openings of urinary tract status: Secondary | ICD-10-CM | POA: Diagnosis not present

## 2024-01-08 DIAGNOSIS — I10 Essential (primary) hypertension: Secondary | ICD-10-CM

## 2024-01-08 DIAGNOSIS — Z7902 Long term (current) use of antithrombotics/antiplatelets: Secondary | ICD-10-CM | POA: Diagnosis not present

## 2024-01-08 DIAGNOSIS — R4189 Other symptoms and signs involving cognitive functions and awareness: Secondary | ICD-10-CM | POA: Diagnosis not present

## 2024-01-08 DIAGNOSIS — I2510 Atherosclerotic heart disease of native coronary artery without angina pectoris: Secondary | ICD-10-CM | POA: Diagnosis not present

## 2024-01-08 DIAGNOSIS — I5032 Chronic diastolic (congestive) heart failure: Secondary | ICD-10-CM

## 2024-01-08 DIAGNOSIS — M25569 Pain in unspecified knee: Secondary | ICD-10-CM | POA: Diagnosis not present

## 2024-01-08 DIAGNOSIS — Z955 Presence of coronary angioplasty implant and graft: Secondary | ICD-10-CM

## 2024-01-08 DIAGNOSIS — I959 Hypotension, unspecified: Secondary | ICD-10-CM

## 2024-01-08 DIAGNOSIS — E8729 Other acidosis: Secondary | ICD-10-CM | POA: Diagnosis not present

## 2024-01-08 DIAGNOSIS — I252 Old myocardial infarction: Secondary | ICD-10-CM | POA: Diagnosis not present

## 2024-01-08 DIAGNOSIS — Z888 Allergy status to other drugs, medicaments and biological substances status: Secondary | ICD-10-CM

## 2024-01-08 DIAGNOSIS — N179 Acute kidney failure, unspecified: Secondary | ICD-10-CM

## 2024-01-08 DIAGNOSIS — Z833 Family history of diabetes mellitus: Secondary | ICD-10-CM | POA: Diagnosis not present

## 2024-01-08 DIAGNOSIS — R634 Abnormal weight loss: Secondary | ICD-10-CM

## 2024-01-08 DIAGNOSIS — R413 Other amnesia: Secondary | ICD-10-CM | POA: Diagnosis not present

## 2024-01-08 LAB — CBC
HCT: 35.9 % — ABNORMAL LOW (ref 39.0–52.0)
Hemoglobin: 12.1 g/dL — ABNORMAL LOW (ref 13.0–17.0)
MCH: 31.9 pg (ref 26.0–34.0)
MCHC: 33.7 g/dL (ref 30.0–36.0)
MCV: 94.7 fL (ref 80.0–100.0)
Platelets: 195 10*3/uL (ref 150–400)
RBC: 3.79 MIL/uL — ABNORMAL LOW (ref 4.22–5.81)
RDW: 18.8 % — ABNORMAL HIGH (ref 11.5–15.5)
WBC: 8.4 10*3/uL (ref 4.0–10.5)
nRBC: 0 % (ref 0.0–0.2)

## 2024-01-08 LAB — HEPATIC FUNCTION PANEL
ALT: 50 U/L — ABNORMAL HIGH (ref 0–44)
AST: 72 U/L — ABNORMAL HIGH (ref 15–41)
Albumin: 3 g/dL — ABNORMAL LOW (ref 3.5–5.0)
Alkaline Phosphatase: 116 U/L (ref 38–126)
Bilirubin, Direct: 0.7 mg/dL — ABNORMAL HIGH (ref 0.0–0.2)
Indirect Bilirubin: 1 mg/dL — ABNORMAL HIGH (ref 0.3–0.9)
Total Bilirubin: 1.7 mg/dL — ABNORMAL HIGH (ref 0.0–1.2)
Total Protein: 5.8 g/dL — ABNORMAL LOW (ref 6.5–8.1)

## 2024-01-08 LAB — PROTIME-INR
INR: 1.2 (ref 0.8–1.2)
Prothrombin Time: 15.9 s — ABNORMAL HIGH (ref 11.4–15.2)

## 2024-01-08 LAB — BASIC METABOLIC PANEL
Anion gap: 9 (ref 5–15)
BUN: 32 mg/dL — ABNORMAL HIGH (ref 8–23)
CO2: 18 mmol/L — ABNORMAL LOW (ref 22–32)
Calcium: 9.9 mg/dL (ref 8.9–10.3)
Chloride: 112 mmol/L — ABNORMAL HIGH (ref 98–111)
Creatinine, Ser: 1.48 mg/dL — ABNORMAL HIGH (ref 0.61–1.24)
GFR, Estimated: 46 mL/min — ABNORMAL LOW (ref >60)
Glucose, Bld: 91 mg/dL (ref 70–99)
Potassium: 4.4 mmol/L (ref 3.5–5.1)
Sodium: 139 mmol/L (ref 135–145)

## 2024-01-08 LAB — TROPONIN I (HIGH SENSITIVITY)
Troponin I (High Sensitivity): 25 ng/L — ABNORMAL HIGH (ref <18)
Troponin I (High Sensitivity): 25 ng/L — ABNORMAL HIGH (ref <18)

## 2024-01-08 LAB — LACTIC ACID, PLASMA: Lactic Acid, Venous: 1.6 mmol/L (ref 0.5–1.9)

## 2024-01-08 LAB — RESP PANEL BY RT-PCR (RSV, FLU A&B, COVID)  RVPGX2
Influenza A by PCR: NEGATIVE
Influenza B by PCR: NEGATIVE
Resp Syncytial Virus by PCR: NEGATIVE
SARS Coronavirus 2 by RT PCR: NEGATIVE

## 2024-01-08 LAB — BRAIN NATRIURETIC PEPTIDE: B Natriuretic Peptide: 123.8 pg/mL — ABNORMAL HIGH (ref 0.0–100.0)

## 2024-01-08 LAB — APTT: aPTT: 36 s (ref 24–36)

## 2024-01-08 MED ORDER — LORATADINE 10 MG PO TABS
10.0000 mg | ORAL_TABLET | Freq: Every day | ORAL | Status: DC
  Administered 2024-01-09 – 2024-01-11 (×3): 10 mg via ORAL
  Filled 2024-01-08 (×3): qty 1

## 2024-01-08 MED ORDER — ENOXAPARIN SODIUM 30 MG/0.3ML IJ SOSY
30.0000 mg | PREFILLED_SYRINGE | INTRAMUSCULAR | Status: DC
  Administered 2024-01-09 – 2024-01-10 (×2): 30 mg via SUBCUTANEOUS
  Filled 2024-01-08 (×2): qty 0.3

## 2024-01-08 MED ORDER — MAGNESIUM HYDROXIDE 400 MG/5ML PO SUSP: 30.0000 mL | Freq: Every day | ORAL | Status: DC | PRN

## 2024-01-08 MED ORDER — NITROGLYCERIN 0.4 MG SL SUBL: 0.4000 mg | SUBLINGUAL_TABLET | SUBLINGUAL | Status: DC | PRN

## 2024-01-08 MED ORDER — ACETAMINOPHEN 650 MG RE SUPP: 650.0000 mg | Freq: Four times a day (QID) | RECTAL | Status: DC | PRN

## 2024-01-08 MED ORDER — METHOTREXATE SODIUM 2.5 MG PO TABS: 15.0000 mg | ORAL_TABLET | ORAL | Status: DC

## 2024-01-08 MED ORDER — ACETAMINOPHEN 325 MG PO TABS
650.0000 mg | ORAL_TABLET | Freq: Four times a day (QID) | ORAL | Status: DC | PRN
Start: 2024-01-08 — End: 2024-01-11

## 2024-01-08 MED ORDER — TRAZODONE HCL 50 MG PO TABS: 25.0000 mg | ORAL_TABLET | Freq: Every evening | ORAL | Status: DC | PRN

## 2024-01-08 MED ORDER — SODIUM CHLORIDE 0.9 % IV SOLN: INTRAVENOUS | Status: DC

## 2024-01-08 MED ORDER — SODIUM CHLORIDE 0.9 % IV SOLN
1.0000 g | INTRAVENOUS | Status: DC
  Administered 2024-01-09 – 2024-01-10 (×2): 1 g via INTRAVENOUS
  Filled 2024-01-08 (×2): qty 10

## 2024-01-08 MED ORDER — LACTATED RINGERS IV BOLUS
500.0000 mL | Freq: Once | INTRAVENOUS | Status: AC
  Administered 2024-01-08: 500 mL via INTRAVENOUS

## 2024-01-08 MED ORDER — TICAGRELOR 90 MG PO TABS
90.0000 mg | ORAL_TABLET | Freq: Two times a day (BID) | ORAL | Status: DC
Start: 2024-01-09 — End: 2024-01-11
  Administered 2024-01-09 – 2024-01-11 (×6): 90 mg via ORAL
  Filled 2024-01-08 (×6): qty 1

## 2024-01-08 MED ORDER — ONDANSETRON HCL 4 MG/2ML IJ SOLN: 4.0000 mg | Freq: Four times a day (QID) | INTRAMUSCULAR | Status: DC | PRN

## 2024-01-08 MED ORDER — LACTATED RINGERS IV BOLUS (SEPSIS)
500.0000 mL | Freq: Once | INTRAVENOUS | Status: AC
  Administered 2024-01-08: 500 mL via INTRAVENOUS

## 2024-01-08 MED ORDER — ONDANSETRON HCL 4 MG PO TABS
4.0000 mg | ORAL_TABLET | Freq: Four times a day (QID) | ORAL | Status: DC | PRN
  Filled 2024-01-08: qty 1

## 2024-01-08 MED ORDER — ROSUVASTATIN CALCIUM 20 MG PO TABS
20.0000 mg | ORAL_TABLET | Freq: Every day | ORAL | Status: DC
  Filled 2024-01-08: qty 1

## 2024-01-08 MED ORDER — SODIUM CHLORIDE 0.9 % IV SOLN
2.0000 g | Freq: Once | INTRAVENOUS | Status: AC
  Administered 2024-01-08: 2 g via INTRAVENOUS
  Filled 2024-01-08: qty 20

## 2024-01-08 NOTE — Patient Instructions (Signed)
Medication Instructions:  Your physician has recommended you make the following change in your medication:   STOP Metoprolol STOP Losartan  *If you need a refill on your cardiac medications before your next appointment, please call your pharmacy*   Lab Work: None  If you have labs (blood work) drawn today and your tests are completely normal, you will receive your results only by: MyChart Message (if you have MyChart) OR A paper copy in the mail If you have any lab test that is abnormal or we need to change your treatment, we will call you to review the results.   Testing/Procedures: None   Follow-Up: At [facility], you and your health needs are our priority.  As part of our continuing mission to provide you with exceptional heart care, we have created designated Provider Care Teams.  These Care Teams include your primary Cardiologist (physician) and Advanced Practice Providers (APPs -  Physician Assistants and Nurse Practitioners) who all work together to provide you with the care you need, when you need it.   Your next appointment:   1 week(s)  Provider:   Eula Listen, PA-C

## 2024-01-08 NOTE — ED Triage Notes (Addendum)
Pt to ed from [facility] for a regular visit. The heart doctor found him to be hypotensive and he had lost a lot \\of  weight. Per daughter he has lost 12 lbs in 2 months. Pt is caox4, in no acute distress in triage. Pt denies any pain or other symptoms at this time. Per daughter, whom he lives with she is concerned with his activity level mad lack of appetite of the last few weeks. Pt also has foul smell with his urine.

## 2024-01-08 NOTE — ED Provider Triage Note (Signed)
Emergency Medicine Provider Triage Evaluation Note  Terry [name] [facility] , a 88 y.o. male  was evaluated in triage.  Pt complains of hypotension, he was at cardiology today and BP was low so he was sent here. Had a cardiac cath in September. Family reports decreased oral intake and weakness for a few weeks.   Review of Systems  Positive: Decreased appetite, weakness, fatigue Negative: CP  Physical Exam  There were no vitals taken for this visit. Gen:   Awake, no distress   Resp:  Normal effort  MSK:   Moves extremities without difficulty  Other:    Medical Decision Making  Medically screening exam initiated at 3:04 PM.  Appropriate orders placed.  Terry [name] [facility] was informed that the remainder of the evaluation will be completed by another provider, this initial triage assessment does not replace that evaluation, and the importance of remaining in the ED until their evaluation is complete.    Terry Ali, PA-C 01/08/24 1507

## 2024-01-08 NOTE — ED Notes (Signed)
Blood cultures and lactic acid were obtained prior to antibiotic start but were sent to lab after antibiotic and fluids were started

## 2024-01-08 NOTE — ED Provider Notes (Signed)
[name] Provider Note    Event Date/Time   First MD Initiated Contact with Patient 01/08/24 2121     (approximate)   History   Shortness of Breath and Failure To Thrive   HPI  Terry [name] is a 88 y.o. male CAD, history of bladder cancer status post total cystectomy with urostomy, history of heart failure, presenting with hypotension and cloudy urine in his urostomy bag.  Per daughter at bedside, patient has had decreased appetite and p.o. intake for the past 2 months.  States they went to see his primary care doctor in the morning and was diagnosed with a UTI and started on antibiotics.  Went to his cardiologist and was found to be hypotensive to systolic 70s which is new for him and sent here for further evaluation.  Patient denies any chest pain, shortness of breath, belly pain, nausea, vomiting, diarrhea.  Independent review of chart, he had an echo done in December of last year which showed an EF of 50 to 55% with grade 1 diastolic dysfunction, had a left heart cath in September that showed LAD lesion that was stented, was found to be hypotensive to the 70s, was found to be hypotensive to the 90s at the PCPs office earlier.  Per daughter patient's blood pressures are typically in the 110s.   Physical Exam   Triage Vital Signs: ED Triage Vitals  Encounter Vitals Group     BP 01/08/24 1507 95/60     Systolic BP Percentile --      Diastolic BP Percentile --      Pulse Rate 01/08/24 1507 78     Resp 01/08/24 1507 16     Temp 01/08/24 1507 97.8 F (36.6 C)     Temp Source 01/08/24 1507 Oral     SpO2 01/08/24 1507 100 %     Weight --      Height --      Head Circumference --      Peak Flow --      Pain Score 01/08/24 1503 0     Pain Loc --      Pain Education --      Exclude from Growth Chart --     Most recent vital signs: Vitals:   01/08/24 2230 01/08/24 2300  BP: 101/62 (!) 123/59  Pulse: 72 82  Resp: (!) 23 18  Temp:     SpO2: 100% 100%     General: Awake, no distress.  CV:  Good peripheral perfusion.  Resp:  Normal effort.  Clear to auscultation Abd:  No distention.  Soft, nontender, urostomy bag in place with cloudy urine Other:  No lower extremity edema, dry mucous membranes, no CVA tenderness   ED Results / Procedures / Treatments   Labs (all labs ordered are listed, but only abnormal results are displayed) Labs Reviewed  BASIC METABOLIC PANEL - Abnormal; Notable for the following components:      Result Value   Chloride 112 (*)    CO2 18 (*)    BUN 32 (*)    Creatinine, Ser 1.48 (*)    GFR, Estimated 46 (*)    All other components within normal limits  CBC - Abnormal; Notable for the following components:   RBC 3.79 (*)    Hemoglobin 12.1 (*)    HCT 35.9 (*)    RDW 18.8 (*)    All other components within normal limits  PROTIME-INR - Abnormal; Notable for the  following components:   Prothrombin Time 15.9 (*)    All other components within normal limits  HEPATIC FUNCTION PANEL - Abnormal; Notable for the following components:   Total Protein 5.8 (*)    Albumin 3.0 (*)    AST 72 (*)    ALT 50 (*)    Total Bilirubin 1.7 (*)    Bilirubin, Direct 0.7 (*)    Indirect Bilirubin 1.0 (*)    All other components within normal limits  TROPONIN I (HIGH SENSITIVITY) - Abnormal; Notable for the following components:   Troponin I (High Sensitivity) 25 (*)    All other components within normal limits  TROPONIN I (HIGH SENSITIVITY) - Abnormal; Notable for the following components:   Troponin I (High Sensitivity) 25 (*)    All other components within normal limits  RESP PANEL BY RT-PCR (RSV, FLU A&B, COVID)  RVPGX2  CULTURE, BLOOD (ROUTINE X 2)  CULTURE, BLOOD (ROUTINE X 2)  LACTIC ACID, PLASMA  APTT  LACTIC ACID, PLASMA  URINALYSIS, W/ REFLEX TO CULTURE (INFECTION SUSPECTED)  BRAIN NATRIURETIC PEPTIDE     EKG  Sinus rhythm with PACs, reassigning 4, normal QS, normal QTc, T wave  inversion to aVL, no ischemic ST elevation, not significant change compared to prior   RADIOLOGY Chest x-ray on my interpretation without focal consolidation.   PROCEDURES:  Critical Care performed: Yes, see critical care procedure note(s)  .Critical Care  Performed by: [name], MD Authorized by: [name], MD   Critical care provider statement:    Critical care time (minutes):  35   Critical care was necessary to treat or prevent imminent or life-threatening deterioration of the following conditions:  Sepsis   Critical care was time spent personally by me on the following activities:  Development of treatment plan with patient or surrogate, discussions with consultants, evaluation of patient's response to treatment, examination of patient, ordering and review of laboratory studies, ordering and review of radiographic studies, ordering and performing treatments and interventions, pulse oximetry, re-evaluation of patient's condition and review of old charts    MEDICATIONS ORDERED IN ED: Medications  lactated ringers bolus 500 mL (500 mLs Intravenous New Bag/Given 01/08/24 2252)  lactated ringers bolus 500 mL (500 mLs Intravenous New Bag/Given 01/08/24 2251)  cefTRIAXone (ROCEPHIN) 2 g in sodium chloride 0.9 % 100 mL IVPB (2 g Intravenous New Bag/Given 01/08/24 2247)     IMPRESSION / MDM / ASSESSMENT AND PLAN / ED COURSE  I reviewed the triage vital signs and the nursing notes.                              Differential diagnosis includes, but is not limited to, UTI, pyelonephritis, sepsis, dehydration, failure to thrive, viral illness, electrolyte derangements.  Will get labs, blood cultures, UA, chest x-ray, respiratory panel.  Will give him IV fluids and empirically treat with ceftriaxone.  Independent review of prior urine cultures, grew Citrobacter as well as prophydentia, sensitive to ceftriaxone.  Patient's presentation is most consistent with acute presentation with  potential threat to life or bodily function.  Independent review of labs and imaging, no leukocytosis, he has a mild AKI, electrolytes not severely deranged, [name] is mildly elevated, he does have some mildly elevated LFTs but he has no right upper quadrant pain or jaundice at this time, lactate is 1.6.  Given his hypotension initially as well as being diagnosed with a UTI, patient is at  high risk and will need to be admitted.  Consult to hospitalist was agreeable plan for admission will evaluate the patient.  He is admitted.      FINAL CLINICAL IMPRESSION(S) / ED DIAGNOSES   Final diagnoses:  Sepsis, due to unspecified organism, unspecified whether acute organ dysfunction present (HCC)  Hypotension, unspecified hypotension type  Urinary tract infection without hematuria, site unspecified     Rx / DC Orders   ED Discharge Orders     None        Note:  This document was prepared using Dragon voice recognition software and may include unintentional dictation errors.    [name], MD 01/08/24 [phone]

## 2024-01-08 NOTE — H&P (Signed)
[place]   PATIENT NAME: Terry [name]    MR#:  [id number]  DATE OF BIRTH:  1936-10-09  DATE OF ADMISSION:  01/08/2024  PRIMARY CARE PHYSICIAN: Wilford Corner, PA-C   Patient is coming from: Home  REQUESTING/REFERRING PHYSICIAN: Dr. Jodie Echevaria  CHIEF COMPLAINT:   Chief Complaint  Patient presents with   Shortness of Breath   Failure To Thrive    HISTORY OF PRESENT ILLNESS:  Terry [name] is a 88 y.o. male with medical history significant for hypertension, dyslipidemia, coronary artery disease, status post urostomy for bladder cancer, and ischemic cardiomyopathy as well as urolithiasis, who presented to the emergency room with acute onset of hypotension and recent UTI for which the patient was seen in the morning and given antibiotic that he has not filled yet.  He has been having diminished appetite and has not been acting himself since yesterday.  He was seen by his primary care physician and later by his cardiologist at which time his BP was 73/36 and was referred to the emergency room.  No fever or chills.  No nausea or vomiting or abdominal pain.  No chest pain or palpitations.  He has been having dyspnea over the last few days.  He has been losing weight with diminished appetite lately.  ED Course: When the patient came to the emergency room, BP was 79/42 with otherwise normal vital signs.  Labs revealed a CO2 of 18 and a BUN of 32 with a creatinine 1.48 that were previously normal and LFTs showed AST of 72 with ALT 52 obtained 5.8 and total bili 1.7.  BNP was 123.8 and high sensitive troponin I was 25 and later the same.  Lactic acid was 1.6 and CBC showed anemia.  INR is 1.2 and PT 15.9.  Respiratory panel came back negative.  Blood cultures were drawn.   EKG as reviewed by me :  EKG showed normal sinus rhythm with rate of 74 with premature atrial complexes and left axis deviation with Q waves anteroseptally. Imaging: 2 view chest x-ray showed hyperinflation with  no acute cardiopulmonary disease.  The patient was given lactated ringer 500 mL twice and 2 g of IV Rocephin.  He will be admitted to a medical bed for further evaluation and management. PAST MEDICAL HISTORY:   Past Medical History:  Diagnosis Date   Bullous pemphigoid    CAD (coronary artery disease)    Cancer (HCC)    bladder cancer   Hyperlipidemia    Hypertension    Ischemic cardiomyopathy    Kidney stones    Nephrolithiasis    Status post surgical removal and fulguration of bladder neoplasm     PAST SURGICAL HISTORY:   Past Surgical History:  Procedure Laterality Date   BLADDER SURGERY  06/2015   CORONARY/GRAFT ACUTE MI REVASCULARIZATION N/A 08/28/2023   Procedure: Coronary/Graft Acute MI Revascularization;  Surgeon: Yates Decamp, MD;  Location: [facility] INVASIVE CV LAB;  Service: Cardiovascular;  Laterality: N/A;   deviated nose septum surgery  1970   EP IMPLANTABLE DEVICE     HERNIA REPAIR  1980   single septum port     stomach ulcer surgery  1994   vascular stent  2006    SOCIAL HISTORY:   Social History   Tobacco Use   Smoking status: Former    Current packs/day: 0.00    Average packs/day: 1.5 packs/day for 50.0 years (75.0 ttl pk-yrs)    Types: Cigarettes    Start  date: 06/19/1955    Quit date: 06/18/2005    Years since quitting: 18.5   Smokeless tobacco: Former    Types: Chew  Substance Use Topics   Alcohol use: No    Alcohol/week: 0.0 standard drinks of alcohol    FAMILY HISTORY:   Family History  Problem Relation Age of Onset   Diabetes Mother    Heart disease Mother    Hypertension Father    Alzheimer's disease Father    Hip fracture Father    Prostate cancer Paternal Uncle     DRUG ALLERGIES:   Allergies  Allergen Reactions   Triprolidine-Pseudoephedrine Rash and Hives   Triprolidine-Pseudoephedrine Hives and Rash   Dapsone Other (See Comments)    Methemoglobinemia   Other reaction(s): Unknown  Methemoglobinemia   Methemoglobinemia    Methemoglobinemia   Other reaction(s): Unknown  Methemoglobinemia   Sudafed Pe Cold-Cough  [Phenylephrine-Dm-Gg-Apap] Hives   Actifed Cold-Allergy  [Chlorpheniramine-Phenylephrine] Rash    REVIEW OF SYSTEMS:   ROS As per history of present illness. All pertinent systems were reviewed above. Constitutional, HEENT, cardiovascular, respiratory, GI, GU, musculoskeletal, neuro, psychiatric, endocrine, integumentary and hematologic systems were reviewed and are otherwise negative/unremarkable except for positive findings mentioned above in the HPI.   MEDICATIONS AT HOME:   Prior to Admission medications   Medication Sig Start Date End Date Taking? Authorizing Provider  acetaminophen (TYLENOL) 325 MG tablet Take 650 mg by mouth every 6 (six) hours as needed for mild pain.    [provider]  cetirizine (ZYRTEC) 10 MG tablet Take 10 mg by mouth daily.    [provider]  diphenhydramine-acetaminophen (TYLENOL PM) 25-500 MG TABS tablet Take 1 tablet by mouth at bedtime as needed (for sleep).    [provider]  methotrexate (RHEUMATREX) 2.5 MG tablet Take 15 mg by mouth once a week. 11/30/23   [provider]  neomycin-bacitracin-polymyxin (NEOSPORIN) [phone] ointment Apply 1 application topically as needed.    [provider]  nitroGLYCERIN (NITROSTAT) 0.4 MG SL tablet Place 1 tablet (0.4 mg total) under the tongue every 5 (five) minutes x 3 doses as needed for chest pain. 08/29/23   Yates Decamp, MD  ondansetron (ZOFRAN) 4 MG tablet Take 1 tablet (4 mg total) by mouth daily as needed for nausea or vomiting. 08/29/23 08/28/24  Yates Decamp, MD  Ostomy Supplies [facility] DRAINABLE) Pouch MISC Ostomy supplies as needed. Diagnosis: Bladder Cancer (C67.9) 12/18/15   [provider]  rosuvastatin (CRESTOR) 20 MG tablet Take 1 tablet (20 mg total) by mouth daily. 09/21/23   Sondra Barges, PA-C  Skin Protectants, Misc. (CAVILON NO STING BARRIER FILM EX) Apply  topically as needed.    [provider]  ticagrelor (BRILINTA) 90 MG TABS tablet Take 1 tablet (90 mg total) by mouth 2 (two) times daily. 12/31/23   Dunn, Raymon Mutton, PA-C  triamcinolone cream (KENALOG) 0.1 % Apply 1 application topically 2 (two) times daily.  07/18/17   [provider]  VITAMIN D PO Take by mouth daily in the afternoon.    [provider]      VITAL SIGNS:  Blood pressure 114/60, pulse 68, temperature (!) 97.4 F (36.3 C), temperature source Axillary, resp. rate 17, SpO2 100%.  PHYSICAL EXAMINATION:  Physical Exam  GENERAL:  88 y.o.-year-old patient lying in the bed with no acute distress.  EYES: Pupils equal, round, reactive to light and accommodation. No scleral icterus. Extraocular muscles intact.  HEENT: Head atraumatic, normocephalic. Oropharynx and nasopharynx  clear.  NECK:  Supple, no jugular venous distention. No thyroid enlargement, no tenderness.  LUNGS: Normal breath sounds bilaterally, no wheezing, rales,rhonchi or crepitation. No use of accessory muscles of respiration.  CARDIOVASCULAR: Regular rate and rhythm, S1, S2 normal. No murmurs, rubs, or gallops.  ABDOMEN: Soft, nondistended, nontender.  Urostomy bag in place with cloudy urine.  Bowel sounds present. No organomegaly or mass.  EXTREMITIES: No pedal edema, cyanosis, or clubbing.  NEUROLOGIC: Cranial nerves II through XII are intact. Muscle strength 5/5 in all extremities. Sensation intact. Gait not checked.  PSYCHIATRIC: The patient is alert and oriented x 3.  Normal affect and good eye contact. SKIN: No obvious rash, lesion, or ulcer.   LABORATORY PANEL:   CBC Recent Labs  Lab 01/08/24 1508  WBC 8.4  HGB 12.1*  HCT 35.9*  PLT 195   ------------------------------------------------------------------------------------------------------------------  Chemistries  Recent Labs  Lab 01/08/24 1508 01/08/24 2250  NA 139  --   K 4.4  --   CL 112*  --   CO2 18*  --    GLUCOSE 91  --   BUN 32*  --   CREATININE 1.48*  --   CALCIUM 9.9  --   AST  --  72*  ALT  --  50*  ALKPHOS  --  116  BILITOT  --  1.7*   ------------------------------------------------------------------------------------------------------------------  Cardiac Enzymes No results for input(s): "TROPONINI" in the last 168 hours. ------------------------------------------------------------------------------------------------------------------  RADIOLOGY:  DG Chest 2 View Result Date: 01/08/2024 CLINICAL DATA:  Hypotension.  Recent history of weight loss. EXAM: CHEST - 2 VIEW COMPARISON:  None Available. FINDINGS: The heart size and mediastinal contours are within normal limits. Right-sided Port-A-Cath tip overlies the superior cavoatrial junction. Hyperinflation. Biapical pleural-parenchymal scarring. No focal consolidation, pleural effusion, or pneumothorax. No acute osseous abnormality. IMPRESSION: Hyperinflation.  No acute cardiopulmonary findings. Electronically Signed   By: Hart Robinsons M.D.   On: 01/08/2024 16:01      IMPRESSION AND PLAN:  Assessment and Plan: * Acute lower UTI - The patient will be placed in a medical observation bed. - Will continue antibiotic therapy with IV Rocephin. - We will follow urine and blood cultures.  AKI (acute kidney injury) (HCC) - This is likely prerenal due to volume depletion. - The patient will be hydrated with IV normal saline. - We will avoid nephrotoxins. - Will follow BMP.  Elevated transaminase level - Follow LFTs with hydration.  Coronary artery disease - We will continue Brilinta and as needed sublingual nitroglycerin.  Dyslipidemia - We will hold off statin therapy for now given elevated LFTs.       DVT prophylaxis: Lovenox.  Advanced Care Planning:  Code Status: full code.  Family Communication:  The plan of care was discussed in details with the patient (and family). I answered all questions. The patient  agreed to proceed with the above mentioned plan. Further management will depend upon hospital course. Disposition Plan: Back to previous home environment Consults called: none.  All the records are reviewed and case discussed with ED provider.  Status is: Observation  I certify that at the time of admission, it is my clinical judgment that the patient will require hospital care extending less than 2 midnights.                            Dispo: The patient is from: Home  Anticipated d/c is to: Home              Patient currently is not medically stable to d/c.              Difficult to place patient: No  Hannah Beat M.D on 01/09/2024 at 6:16 AM  [facility]   From 7 PM-7 AM, contact night-coverage [email or web address]  CC: Primary care physician; Whitaker, [employer], PA-C

## 2024-01-08 NOTE — Sepsis Progress Note (Signed)
Elink monitoring for the code sepsis protocol.

## 2024-01-08 NOTE — Progress Notes (Signed)
CODE SEPSIS - PHARMACY COMMUNICATION  **Broad Spectrum Antibiotics should be administered within 1 hour of Sepsis diagnosis**  Time Code Sepsis Called/Page Received: 2157  Antibiotics Ordered: Ceftriaxone  Time of 1st antibiotic administration: 2247  Otelia Sergeant, PharmD, MBA 01/08/2024 10:00 PM

## 2024-01-09 DIAGNOSIS — Z87891 Personal history of nicotine dependence: Secondary | ICD-10-CM | POA: Diagnosis not present

## 2024-01-09 DIAGNOSIS — I10 Essential (primary) hypertension: Secondary | ICD-10-CM | POA: Diagnosis present

## 2024-01-09 DIAGNOSIS — E785 Hyperlipidemia, unspecified: Secondary | ICD-10-CM | POA: Diagnosis present

## 2024-01-09 DIAGNOSIS — Z87442 Personal history of urinary calculi: Secondary | ICD-10-CM | POA: Diagnosis not present

## 2024-01-09 DIAGNOSIS — Z955 Presence of coronary angioplasty implant and graft: Secondary | ICD-10-CM | POA: Diagnosis not present

## 2024-01-09 DIAGNOSIS — R7401 Elevation of levels of liver transaminase levels: Secondary | ICD-10-CM | POA: Diagnosis present

## 2024-01-09 DIAGNOSIS — I2510 Atherosclerotic heart disease of native coronary artery without angina pectoris: Secondary | ICD-10-CM | POA: Diagnosis present

## 2024-01-09 DIAGNOSIS — N390 Urinary tract infection, site not specified: Secondary | ICD-10-CM | POA: Diagnosis present

## 2024-01-09 DIAGNOSIS — I959 Hypotension, unspecified: Secondary | ICD-10-CM | POA: Diagnosis present

## 2024-01-09 DIAGNOSIS — Z8249 Family history of ischemic heart disease and other diseases of the circulatory system: Secondary | ICD-10-CM | POA: Diagnosis not present

## 2024-01-09 DIAGNOSIS — Z8551 Personal history of malignant neoplasm of bladder: Secondary | ICD-10-CM | POA: Diagnosis not present

## 2024-01-09 DIAGNOSIS — Z833 Family history of diabetes mellitus: Secondary | ICD-10-CM | POA: Diagnosis not present

## 2024-01-09 DIAGNOSIS — N179 Acute kidney failure, unspecified: Secondary | ICD-10-CM

## 2024-01-09 DIAGNOSIS — I252 Old myocardial infarction: Secondary | ICD-10-CM | POA: Diagnosis not present

## 2024-01-09 DIAGNOSIS — Z7902 Long term (current) use of antithrombotics/antiplatelets: Secondary | ICD-10-CM | POA: Diagnosis not present

## 2024-01-09 DIAGNOSIS — Z936 Other artificial openings of urinary tract status: Secondary | ICD-10-CM | POA: Diagnosis not present

## 2024-01-09 DIAGNOSIS — Z888 Allergy status to other drugs, medicaments and biological substances status: Secondary | ICD-10-CM | POA: Diagnosis not present

## 2024-01-09 DIAGNOSIS — R627 Adult failure to thrive: Secondary | ICD-10-CM | POA: Diagnosis present

## 2024-01-09 DIAGNOSIS — E8729 Other acidosis: Secondary | ICD-10-CM | POA: Diagnosis present

## 2024-01-09 DIAGNOSIS — I255 Ischemic cardiomyopathy: Secondary | ICD-10-CM | POA: Diagnosis present

## 2024-01-09 DIAGNOSIS — B952 Enterococcus as the cause of diseases classified elsewhere: Secondary | ICD-10-CM | POA: Diagnosis present

## 2024-01-09 DIAGNOSIS — Z1152 Encounter for screening for COVID-19: Secondary | ICD-10-CM | POA: Diagnosis not present

## 2024-01-09 DIAGNOSIS — E878 Other disorders of electrolyte and fluid balance, not elsewhere classified: Secondary | ICD-10-CM | POA: Diagnosis present

## 2024-01-09 HISTORY — DX: Acute kidney failure, unspecified: N17.9

## 2024-01-09 LAB — BASIC METABOLIC PANEL
Anion gap: 9 (ref 5–15)
BUN: 35 mg/dL — ABNORMAL HIGH (ref 8–23)
CO2: 15 mmol/L — ABNORMAL LOW (ref 22–32)
Calcium: 9.2 mg/dL (ref 8.9–10.3)
Chloride: 114 mmol/L — ABNORMAL HIGH (ref 98–111)
Creatinine, Ser: 1.41 mg/dL — ABNORMAL HIGH (ref 0.61–1.24)
GFR, Estimated: 49 mL/min — ABNORMAL LOW (ref >60)
Glucose, Bld: 100 mg/dL — ABNORMAL HIGH (ref 70–99)
Potassium: 4.1 mmol/L (ref 3.5–5.1)
Sodium: 138 mmol/L (ref 135–145)

## 2024-01-09 LAB — URINALYSIS, W/ REFLEX TO CULTURE (INFECTION SUSPECTED)
Bilirubin Urine: NEGATIVE
Glucose, UA: NEGATIVE mg/dL
Ketones, ur: NEGATIVE mg/dL
Nitrite: POSITIVE — AB
Protein, ur: 100 mg/dL — AB
RBC / HPF: >50 RBC/hpf (ref 0–5)
Specific Gravity, Urine: 1.01 (ref 1.005–1.030)
WBC, UA: >50 WBC/hpf (ref 0–5)
pH: 8 (ref 5.0–8.0)

## 2024-01-09 LAB — CBC
HCT: 33.5 % — ABNORMAL LOW (ref 39.0–52.0)
Hemoglobin: 10.6 g/dL — ABNORMAL LOW (ref 13.0–17.0)
MCH: 32 pg (ref 26.0–34.0)
MCHC: 31.6 g/dL (ref 30.0–36.0)
MCV: 101.2 fL — ABNORMAL HIGH (ref 80.0–100.0)
Platelets: 154 10*3/uL (ref 150–400)
RBC: 3.31 MIL/uL — ABNORMAL LOW (ref 4.22–5.81)
RDW: 18.9 % — ABNORMAL HIGH (ref 11.5–15.5)
WBC: 7.5 10*3/uL (ref 4.0–10.5)
nRBC: 0 % (ref 0.0–0.2)

## 2024-01-09 LAB — HEPATIC FUNCTION PANEL
ALT: 46 U/L — ABNORMAL HIGH (ref 0–44)
AST: 64 U/L — ABNORMAL HIGH (ref 15–41)
Albumin: 2.6 g/dL — ABNORMAL LOW (ref 3.5–5.0)
Alkaline Phosphatase: 106 U/L (ref 38–126)
Bilirubin, Direct: 0.3 mg/dL — ABNORMAL HIGH (ref 0.0–0.2)
Indirect Bilirubin: 0.6 mg/dL (ref 0.3–0.9)
Total Bilirubin: 0.9 mg/dL (ref 0.0–1.2)
Total Protein: 5.3 g/dL — ABNORMAL LOW (ref 6.5–8.1)

## 2024-01-09 LAB — LACTIC ACID, PLASMA: Lactic Acid, Venous: 1.6 mmol/L (ref 0.5–1.9)

## 2024-01-09 MED ORDER — METHOTREXATE SODIUM 2.5 MG PO TABS
15.0000 mg | ORAL_TABLET | ORAL | Status: DC
  Administered 2024-01-09: 15 mg via ORAL
  Filled 2024-01-09: qty 6

## 2024-01-09 MED ORDER — LACTATED RINGERS IV SOLN: INTRAVENOUS | Status: AC

## 2024-01-09 MED ORDER — SODIUM BICARBONATE 650 MG PO TABS
650.0000 mg | ORAL_TABLET | Freq: Four times a day (QID) | ORAL | Status: AC
  Administered 2024-01-09 (×2): 650 mg via ORAL
  Filled 2024-01-09 (×2): qty 1

## 2024-01-09 MED ORDER — ENSURE ENLIVE PO LIQD
237.0000 mL | Freq: Two times a day (BID) | ORAL | Status: DC
  Administered 2024-01-10 – 2024-01-11 (×3): 237 mL via ORAL

## 2024-01-09 NOTE — Progress Notes (Signed)
Pt has nephrostomy tube. States he doesn't urinate through his penis.

## 2024-01-09 NOTE — Care Management Obs Status (Signed)
MEDICARE OBSERVATION STATUS NOTIFICATION   Patient Details  Name: Lawerence Dery [facility] MRN: [id number] Date of Birth: 02-Jan-1936   Medicare Observation Status Notification Given:  Yes    Susa Simmonds, LCSWA 01/09/2024, 4:04 PM

## 2024-01-09 NOTE — Assessment & Plan Note (Signed)
-   Follow LFTs with hydration.

## 2024-01-09 NOTE — Progress Notes (Signed)
Progress Note   Patient: Terry [name] [facility] ZOX:[id number] DOB: Dec 18, 1935 DOA: 01/08/2024     0 DOS: the patient was seen and examined on 01/09/2024   Brief hospital course: HPI on admission 01/08/2024 PM: "Shey Bartmess is a 88 y.o. male with medical history significant for hypertension, dyslipidemia, coronary artery disease, status post urostomy for bladder cancer, and ischemic cardiomyopathy as well as urolithiasis, who presented to the emergency room with acute onset of hypotension and recent UTI for which the patient was seen in the morning and given antibiotic that he has not filled yet.  He has been having diminished appetite and has not been acting himself since yesterday.  He was seen by his primary care physician and later by his cardiologist at which time his BP was 73/36 and was referred to the emergency room. ..." See H&P for full HPI on admission & ED course.  Pt was admitted for further evaluation and management of acute lower UTI, started on empiric IV antibiotics pending culture results.   Assessment and Plan:  Acute lower UTI Pt did not meet sepsis criteria on admission - no SIRS criteria and normal lactic acid. --Continue empiric IV Rocephin --Follow pending urine culture  AKI (acute kidney injury) - POA with Cr 1.48 on admission.  Baseline Cr appears 1.0-1.2. Pre-renal etiology in setting of hypotension, volume depletion. Placed on normal saline on admission, now with hyperchloremic metabolic acidosis. --Change IV fluids to LR --Monitor BMP  Hyperchloremic metabolic acidosis --Change IV fluids to LR --PO sodium bicarb today --Repeat BMP in AM  Elevated transaminase level - Follow LFTs with hydration.  Coronary artery disease - stable no chest pain. STEMI noted back in September, cath report reviewed - Continue home Brilinta  --PRN sublingual nitroglycerin. --Follow up with Cardiology  Dyslipidemia --Statin held on admission with mildly elevated  LFT's --Resume statin if LFT's improved tomorrow      Subjective: Pt seen in ED holding for a bed, family at bedside.  He denies complaints, reports feeling little better.  Family report pt has been quite confused, not at baseline mental status, not eating or drinking very much.   Pt is poor historian, does not offer history.   Physical Exam: Vitals:   01/09/24 1300 01/09/24 1400 01/09/24 1545 01/09/24 1600  BP: (!) 95/58 (!) 88/56  (!) 94/58  Pulse: 78 64  65  Resp: 19 15  14   Temp:   98.1 F (36.7 C)   TempSrc:   Oral   SpO2: 100% 100%  100%   General exam: awake, alert, no acute distress, frail appearing, underweight HEENT: moist mucus membranes, hearing grossly normal  Respiratory system: CTAB, no wheezes, rales or rhonchi, normal respiratory effort. Cardiovascular system: normal S1/S2, RRR, no pedal edema.   Gastrointestinal system: soft, NT, ND, no HSM felt, +bowel sounds. Central nervous system: A&O x self, hospital. no gross focal neurologic deficits, normal speech Extremities: moves all , no edema, normal tone Skin: dry, intact, normal temperature Psychiatry: normal mood, congruent affect   Data Reviewed:  Notable labs --  Cl 114 Bicarb 18 >> 15 Glucose 100 BUN 35 Cr 1.48 >> 1.41 AST 72 >> 64 ALT 50 >> 46 Tbili 1.7 >> 0.9  Lactic acid normal - 1.6 twice  Hbg 12.1 >> 10.6 likely hemo-dilution from IV fluids    Family Communication: family at bedside on rounds  Disposition: Status is: Inpatient Remains inpatient appropriate because: remains on IV antibiotics and IV fluids   Planned Discharge Destination:  Home    Time spent: 45 minutes  Author: Pennie Banter, DO 01/09/2024 4:34 PM  For on call review [email or web address].

## 2024-01-09 NOTE — Progress Notes (Signed)
PHARMACIST - PHYSICIAN COMMUNICATION  CONCERNING:  Enoxaparin (Lovenox) for DVT Prophylaxis    RECOMMENDATION: Patient was prescribed enoxaprin 40mg  q24 hours for VTE prophylaxis.   There were no vitals filed for this visit.  There is no height or weight on file to calculate BMI.  Estimated Creatinine Clearance: 23.8 mL/min (A) (by C-G formula based on SCr of 1.48 mg/dL (H)).  Patient is candidate for enoxaparin 30mg  every 24 hours based on CrCl <49ml/min or Weight <45kg  DESCRIPTION: Pharmacy has adjusted enoxaparin dose per [facility] policy.  Patient is now receiving enoxaparin 30 mg every 24 hours   Otelia Sergeant, PharmD, [facility] 01/09/2024 12:13 AM

## 2024-01-09 NOTE — Assessment & Plan Note (Signed)
-   This is likely prerenal due to volume depletion. - The patient will be hydrated with IV normal saline. - We will avoid nephrotoxins. - Will follow BMP.

## 2024-01-09 NOTE — Assessment & Plan Note (Addendum)
-   We will continue Brilinta and as needed sublingual nitroglycerin.

## 2024-01-09 NOTE — Assessment & Plan Note (Signed)
-  We will hold off statin therapy for now given elevated LFTs. ?

## 2024-01-09 NOTE — Assessment & Plan Note (Signed)
-   The patient will be placed in a medical observation bed. - Will continue antibiotic therapy with IV Rocephin. - We will follow urine and blood cultures.

## 2024-01-10 DIAGNOSIS — N390 Urinary tract infection, site not specified: Secondary | ICD-10-CM | POA: Diagnosis not present

## 2024-01-10 LAB — HEPATIC FUNCTION PANEL
ALT: 40 U/L (ref 0–44)
AST: 50 U/L — ABNORMAL HIGH (ref 15–41)
Albumin: 2.4 g/dL — ABNORMAL LOW (ref 3.5–5.0)
Alkaline Phosphatase: 96 U/L (ref 38–126)
Bilirubin, Direct: 0.4 mg/dL — ABNORMAL HIGH (ref 0.0–0.2)
Indirect Bilirubin: 0.7 mg/dL (ref 0.3–0.9)
Total Bilirubin: 1.1 mg/dL (ref 0.0–1.2)
Total Protein: 4.8 g/dL — ABNORMAL LOW (ref 6.5–8.1)

## 2024-01-10 LAB — BASIC METABOLIC PANEL
Anion gap: 4 — ABNORMAL LOW (ref 5–15)
BUN: 24 mg/dL — ABNORMAL HIGH (ref 8–23)
CO2: 17 mmol/L — ABNORMAL LOW (ref 22–32)
Calcium: 8.7 mg/dL — ABNORMAL LOW (ref 8.9–10.3)
Chloride: 114 mmol/L — ABNORMAL HIGH (ref 98–111)
Creatinine, Ser: 1.02 mg/dL (ref 0.61–1.24)
GFR, Estimated: >60 mL/min (ref >60)
Glucose, Bld: 84 mg/dL (ref 70–99)
Potassium: 3.7 mmol/L (ref 3.5–5.1)
Sodium: 135 mmol/L (ref 135–145)

## 2024-01-10 LAB — CBC
HCT: 28.4 % — ABNORMAL LOW (ref 39.0–52.0)
Hemoglobin: 9.8 g/dL — ABNORMAL LOW (ref 13.0–17.0)
MCH: 31.6 pg (ref 26.0–34.0)
MCHC: 34.5 g/dL (ref 30.0–36.0)
MCV: 91.6 fL (ref 80.0–100.0)
Platelets: 137 10*3/uL — ABNORMAL LOW (ref 150–400)
RBC: 3.1 MIL/uL — ABNORMAL LOW (ref 4.22–5.81)
RDW: 18.4 % — ABNORMAL HIGH (ref 11.5–15.5)
WBC: 5.2 10*3/uL (ref 4.0–10.5)
nRBC: 0 % (ref 0.0–0.2)

## 2024-01-10 MED ORDER — ENOXAPARIN SODIUM 40 MG/0.4ML IJ SOSY
40.0000 mg | PREFILLED_SYRINGE | INTRAMUSCULAR | Status: DC
Start: 2024-01-11 — End: 2024-01-11
  Administered 2024-01-11: 40 mg via SUBCUTANEOUS
  Filled 2024-01-10: qty 0.4

## 2024-01-10 MED ORDER — ROSUVASTATIN CALCIUM 10 MG PO TABS
20.0000 mg | ORAL_TABLET | Freq: Every day | ORAL | Status: DC
  Administered 2024-01-10: 20 mg via ORAL
  Filled 2024-01-10: qty 2

## 2024-01-10 NOTE — Progress Notes (Addendum)
Progress Note   Patient: Terry [name] FAO:[id number] DOB: Jan 18, 1936 DOA: 01/08/2024     1 DOS: the patient was seen and examined on 01/10/2024   Brief hospital course: HPI on admission 01/08/2024 PM: "Terry [name] is a 88 y.o. male with medical history significant for hypertension, dyslipidemia, coronary artery disease, status post urostomy for bladder cancer, and ischemic cardiomyopathy as well as urolithiasis, who presented to the emergency room with acute onset of hypotension and recent UTI for which the patient was seen in the morning and given antibiotic that he has not filled yet.  He has been having diminished appetite and has not been acting himself since yesterday.  He was seen by his primary care physician and later by his cardiologist at which time his BP was 73/36 and was referred to the emergency room. ..." See H&P for full HPI on admission & ED course.  Pt was admitted for further evaluation and management of acute lower UTI, started on empiric IV antibiotics pending culture results.   Assessment and Plan:  Acute lower UTI Pt did not meet sepsis criteria on admission - no SIRS criteria and normal lactic acid. --Continue empiric IV Rocephin --Follow pending urine culture  AKI (acute kidney injury) - POA with Cr 1.48 on admission. Resolved. Baseline Cr appears 1.0-1.2. Pre-renal etiology in setting of hypotension, volume depletion. Placed on normal saline on admission, now with hyperchloremic metabolic acidosis. --Monitor off IV fluids --Encourage PO hydration --Monitor BMP  Hyperchloremic metabolic acidosis --Changed IV fluids to LR -- now off fluids --PO sodium bicarb x 2 doses given 1/25 --Repeat BMP in AM  Elevated transaminase level - improved - Follow LFTs.  Coronary artery disease - stable no chest pain. STEMI noted back in September, cath report reviewed - Continue home Brilinta  --PRN sublingual nitroglycerin. --Follow up with  Cardiology  Dyslipidemia --Statin held on admission with mildly elevated LFT's --Resume statin tonight      Subjective: Pt seen awake sitting up in bed this AM.  Reports feeling about the same, maybe a little better. States he wants to go home today, does not like being stuck in the bed.  States he likes to get up and do things.  No other acute complaints or events reported.   Physical Exam: Vitals:   01/09/24 1937 01/09/24 2011 01/10/24 0338 01/10/24 0713  BP: 118/62  104/60 113/62  Pulse: 85  80 78  Resp: 16  16 18   Temp: 98.5 F (36.9 C)  98.1 F (36.7 C) 98 F (36.7 C)  TempSrc: Oral  Oral   SpO2: 99%  98% 98%  Weight:  50.7 kg    Height:  5\' 7"  (1.702 m)     General exam: awake, alert, no acute distress, frail appearing, underweight HEENT: moist mucus membranes, hearing grossly normal  Respiratory system: CTAB no wheezes or rhonchi, normal respiratory effort. Cardiovascular system: normal S1/S2, RRR, no pedal edema.   Gastrointestinal system: soft, NT, ND Central nervous system: A&O x self, hospital. no gross focal neurologic deficits, normal speech Extremities: moves all , no edema, normal tone Skin: dry, intact, normal temperature Psychiatry: normal mood, congruent affect   Data Reviewed:  Notable labs --  Cl 114 Bicarb 18 >> 15 >> 17 Glucose 114 BUN 24 Cr 1.48 >> 1.41 >> 1.02 AST 72 >> 64 >> 50   Hbg 12.1 >> 10.6 >> 9.8 likely hemo-dilution from IV fluids    Family Communication: family at bedside on rounds 1/25.  Talked  to daughter Inetta Fermo by phone this afternoon.  Disposition: Status is: Inpatient Remains inpatient appropriate because: remains on IV antibiotics pending cultures   Planned Discharge Destination: Home    Time spent: 42 minutes  Author: Pennie Banter, DO 01/10/2024 1:45 PM  For on call review [email or web address].

## 2024-01-10 NOTE — Plan of Care (Signed)

## 2024-01-10 NOTE — TOC Initial Note (Signed)
Transition of Care [facility]) - Initial/Assessment Note    Patient Details  Name: Terry [name] MRN: [id number] Date of Birth: 1936/03/03  Transition of Care [facility]) CM/SW Contact:    Rodney Langton, RN Phone Number: 01/10/2024, 2:55 PM  Clinical Narrative:                  Admitted from home with daughter, Terry [name].  Debbra Riding, PA is PCP, uses [facility] for medications, delivered to the home. He does not currently have any DME in the home.  Terry [name] is aware of recommendations for Home health for PT/OT, agrees and does not have preference.  Also agrees to have rolling walker ordered, state patient may be going home tomorrow.  DME ordered through [employer], spoke with [facility], and [facility] referral sent to and accepted by [facility] with [facility] Furbish.    Expected Discharge Plan: Home w Home Health Services Barriers to Discharge: Continued Medical Work up   Patient Goals and CMS Choice Patient states their goals for this hospitalization and ongoing recovery are:: Home with home health CMS Medicare.gov Compare Post Acute Care list provided to:: Patient Represenative (must comment) Choice offered to / list presented to : Adult Children      Expected Discharge Plan and Services     Post Acute Care Choice: Home Health Living arrangements for the past 2 months: Single Family Home                 DME Arranged: Walker rolling DME Agency: [employer] Date DME Agency Contacted: 01/10/24 Time DME Agency Contacted: 1455 Representative spoke with at DME Agency: [facility] HH Arranged: PT, OT HH Agency: [facility] Date [facility] Agency Contacted: 01/10/24 Time HH Agency Contacted: 1455 Representative spoke with at [facility] Agency: Kandee Keen  Prior Living Arrangements/Services Living arrangements for the past 2 months: Single Family Home Lives with:: Adult Children Patient language and need for interpreter reviewed:: Yes Do you feel safe going back to the place where you live?: Yes      Need for Family  Participation in Patient Care: Yes (Comment) Care giver support system in place?: Yes (comment)   Criminal Activity/Legal Involvement Pertinent to Current Situation/Hospitalization: No - Comment as needed  Activities of Daily Living   ADL Screening (condition at time of admission) Independently performs ADLs?: No Does the patient have a NEW difficulty with bathing/dressing/toileting/self-feeding that is expected to last >3 days?: No Does the patient have a NEW difficulty with getting in/out of bed, walking, or climbing stairs that is expected to last >3 days?: No Does the patient have a NEW difficulty with communication that is expected to last >3 days?: No Is the patient deaf or have difficulty hearing?: Yes Does the patient have difficulty seeing, even when wearing glasses/contacts?: No Does the patient have difficulty concentrating, remembering, or making decisions?: Yes  Permission Sought/Granted   Permission granted to share information with : Yes, Verbal Permission Granted              Emotional Assessment              Admission diagnosis:  Acute lower UTI [N39.0] Hypotension, unspecified hypotension type [I95.9] Urinary tract infection without hematuria, site unspecified [N39.0] Sepsis, due to unspecified organism, unspecified whether acute organ dysfunction present [facility]) [A41.9] Patient Active Problem List   Diagnosis Date Noted   AKI (acute kidney injury) (HCC) 01/09/2024   Elevated transaminase level 01/09/2024   Dyslipidemia 01/09/2024   Coronary artery disease 01/09/2024   Acute lower  UTI 01/08/2024   STEMI (ST elevation myocardial infarction) (HCC) 08/28/2023   Acute ST elevation myocardial infarction (STEMI) due to occlusion of mid portion of left anterior descending (LAD) coronary artery (HCC) 08/28/2023   S/P ileal conduit (HCC) 08/07/2015   History of surgical procedure 08/07/2015   Bladder cancer (HCC) 06/21/2015   Cancer of lateral wall of urinary  bladder (HCC) 04/26/2015   Malignant neoplasm of lateral wall of urinary bladder (HCC) 04/26/2015   Coronary artery disease involving native coronary artery of native heart with unstable angina pectoris (HCC) 04/24/2015   Blood pressure elevated 04/24/2015   Hypercholesteremia 04/24/2015   Calculus of kidney 04/24/2015   Psoriasis 04/24/2015   Gastric ulcer 04/24/2015   Pain in soft tissues of limb 04/24/2015   Abnormal LFTs 04/24/2015   Benign essential HTN 03/27/2015   Combined fat and carbohydrate induced hyperlipemia 03/27/2015   Breathlessness on exertion 03/27/2015   PCP:  Wilford Corner, PA-C Pharmacy:   [facility] - [place], [place] - [address] [place] [address] Phone: [phone] Fax: [phone]  [facility] [address] [place] [address] Phone: [phone] Fax: [phone]  [facility] [address], [place] - [address] [place] [address] Phone: [phone] Fax: [phone]     Social Drivers of Health (SDOH) Social History: SDOH Screenings   Food Insecurity: No Food Insecurity (01/09/2024)  Housing: Unknown (01/09/2024)  Transportation Needs: No Transportation Needs (01/09/2024)  Utilities: Not At Risk (01/09/2024)  Alcohol Screen: Low Risk  (08/26/2019)  Depression (PHQ2-9): Low Risk  (02/01/2020)  Financial Resource Strain: Low Risk  (08/27/2023)   Received from [facility]  Physical Activity: Inactive (02/01/2020)  Social Connections: Socially Isolated (01/09/2024)  Stress: No Stress Concern Present (02/01/2020)  Tobacco Use: Medium Risk (01/08/2024)   SDOH Interventions:     Readmission Risk Interventions     No data to display

## 2024-01-10 NOTE — Evaluation (Signed)
Physical Therapy Evaluation Patient Details Name: Terry [name] [facility] MRN: [id number] DOB: [birth year] Today's Date: 01/10/2024  History of Present Illness  88 y.o. male with medical history significant for hypertension, dyslipidemia, coronary artery disease, status post urostomy for bladder cancer, and ischemic cardiomyopathy as well as urolithiasis, who presented to the emergency room with acute onset of hypotension and recent UTI for which the patient was seen in the morning and given antibiotic that he has not filled yet.  He has been having diminished appetite and has not been acting himself since yesterday.  He was seen by his primary care physician and later by his cardiologist at which time his BP was 73/36  Clinical Impression  Pt pleasant and eager to work with PT.  Ultimately he was able to move well and showed good confidence and safety with circumambulation of the nurses' station with consistent with no LOBs, minimal fatigue (HR did get to 120s).  He had mild limp on the L that has been his recent baseline, did better with walker than single UE on hallway rail.  Pt will benefit from continued PT to address functional limitations.        If plan is discharge home, recommend the following: Assist for transportation;Assistance with cooking/housework   Can travel by private Tax inspector (2 wheels)  Recommendations for Other Services       Functional Status Assessment Patient has had a recent decline in their functional status and demonstrates the ability to make significant improvements in function in a reasonable and predictable amount of time.     Precautions / Restrictions Precautions Precautions: Fall Restrictions Weight Bearing Restrictions Per Provider Order: No      Mobility  Bed Mobility Overal bed mobility: Modified Independent             General bed mobility comments: Pt was able to roll to side and transition to  sitting w/o assist or need for bed rails    Transfers Overall transfer level: Modified independent Equipment used: Rolling walker (2 wheels)               General transfer comment: Pt was able to rise to standing from multiple height surfaces w/o assist.  heavy forward lean and UE use but safe and showing confidence    Ambulation/Gait Ambulation/Gait assistance: Supervision Gait Distance (Feet): 200 Feet Assistive device: Rolling walker (2 wheels)         General Gait Details: Pt showed good confidence and motivation for prolonged bout of ambulation around the nurses' station.  He had consistent L limp (per new baseline).  He was able to do ~20 ft with single UE on hallway rail but limp increased and speed decreased - much better cadence with walker.  Stairs            Wheelchair Mobility     Tilt Bed    Modified Rankin (Stroke Patients Only)       Balance Overall balance assessment: Needs assistance Sitting-balance support: No upper extremity supported Sitting balance-Leahy Scale: Good       Standing balance-Leahy Scale: Fair Standing balance comment: better with b/l UE support, but able to maintain static balance w/o UEs relatively well                             Pertinent Vitals/Pain Pain Assessment Pain Assessment: 0-10 Pain Score: 3  Pain Location:  L knee    Home Living Family/patient expects to be discharged to:: Private residence Living Arrangements: Children Available Help at Discharge: Available PRN/intermittently (daughter works OOH)   Home Access: Stairs to enter Entrance Stairs-Rails: Can reach both Secretary/administrator of Steps: 4     Home Equipment: Gilmer Mor - single point      Prior Function Prior Level of Function : Independent/Modified Independent;History of Falls (last six months)             Mobility Comments: Until recently pt has been able to manage all in home mobility w/o AD or much assist from  daughter. ADLs Comments: Daughter does all OOH errands but pt is able to manage in home ADLs     Extremity/Trunk Assessment   Upper Extremity Assessment Upper Extremity Assessment: Generalized weakness    Lower Extremity Assessment Lower Extremity Assessment: Generalized weakness       Communication   Communication Communication: Hearing impairment  Cognition Arousal: Alert Behavior During Therapy: WFL for tasks assessed/performed Overall Cognitive Status: Within Functional Limits for tasks assessed                                          General Comments General comments (skin integrity, edema, etc.): Pt's SpO2 remains in the mid/high 90s t/o the effort on room air, HR did increase from 90s to 120s with prolonged ambulation    Exercises     Assessment/Plan    PT Assessment Patient needs continued PT services  PT Problem List Decreased strength;Decreased range of motion;Decreased activity tolerance;Decreased balance;Decreased mobility;Decreased knowledge of use of DME;Decreased safety awareness;Cardiopulmonary status limiting activity       PT Treatment Interventions DME instruction;Gait training;Stair training;Therapeutic activities;Functional mobility training;Therapeutic exercise;Balance training;Patient/family education    PT Goals (Current goals can be found in the Care Plan section)  Acute Rehab PT Goals Patient Stated Goal: go home PT Goal Formulation: With patient/family Time For Goal Achievement: 01/23/24 Potential to Achieve Goals: Fair    Frequency Min 1X/week     Co-evaluation               AM-PAC PT "6 Clicks" Mobility  Outcome Measure Help needed turning from your back to your side while in a flat bed without using bedrails?: None Help needed moving from lying on your back to sitting on the side of a flat bed without using bedrails?: None Help needed moving to and from a bed to a chair (including a wheelchair)?: None Help  needed standing up from a chair using your arms (e.g., wheelchair or bedside chair)?: A Little Help needed to walk in hospital room?: A Little Help needed climbing 3-5 steps with a railing? : A Lot 6 Click Score: 20    End of Session Equipment Utilized During Treatment: Gait belt Activity Tolerance: Patient tolerated treatment well Patient left: with chair alarm set;with call bell/phone within reach;with family/visitor present Nurse Communication: Mobility status PT Visit Diagnosis: Muscle weakness (generalized) (M62.81);Difficulty in walking, not elsewhere classified (R26.2);Pain Pain - Right/Left: Left Pain - part of body: Knee    Time: 6213-0865 PT Time Calculation (min) (ACUTE ONLY): 17 min   Charges:   PT Evaluation $PT Eval Low Complexity: 1 Low   PT General Charges $$ ACUTE PT VISIT: 1 Visit         Malachi Pro, DPT 01/10/2024, 1:30 PM

## 2024-01-10 NOTE — Progress Notes (Signed)
OT Cancellation Note  Patient Details Name: Terry [name] [facility] MRN: [id number] DOB: Apr 10, 1936   Cancelled Treatment:    Reason Eval/Treat Not Completed: Other (comment). Consult received, chart reviewed. Pt in hall with PT, unavailable for OT evaluation. Will re-attempt at later date/time as pt is available.   Arman Filter., MPH, MS, OTR/L ascom [phone] 01/10/24, 12:07 PM

## 2024-01-11 DIAGNOSIS — N390 Urinary tract infection, site not specified: Secondary | ICD-10-CM | POA: Diagnosis not present

## 2024-01-11 LAB — HEPATIC FUNCTION PANEL
ALT: 38 U/L (ref 0–44)
AST: 48 U/L — ABNORMAL HIGH (ref 15–41)
Albumin: 2.3 g/dL — ABNORMAL LOW (ref 3.5–5.0)
Alkaline Phosphatase: 92 U/L (ref 38–126)
Bilirubin, Direct: 0.3 mg/dL — ABNORMAL HIGH (ref 0.0–0.2)
Indirect Bilirubin: 0.7 mg/dL (ref 0.3–0.9)
Total Bilirubin: 1 mg/dL (ref 0.0–1.2)
Total Protein: 4.8 g/dL — ABNORMAL LOW (ref 6.5–8.1)

## 2024-01-11 LAB — BASIC METABOLIC PANEL
Anion gap: 3 — ABNORMAL LOW (ref 5–15)
BUN: 23 mg/dL (ref 8–23)
CO2: 20 mmol/L — ABNORMAL LOW (ref 22–32)
Calcium: 8.6 mg/dL — ABNORMAL LOW (ref 8.9–10.3)
Chloride: 114 mmol/L — ABNORMAL HIGH (ref 98–111)
Creatinine, Ser: 0.96 mg/dL (ref 0.61–1.24)
GFR, Estimated: >60 mL/min (ref >60)
Glucose, Bld: 91 mg/dL (ref 70–99)
Potassium: 4.1 mmol/L (ref 3.5–5.1)
Sodium: 137 mmol/L (ref 135–145)

## 2024-01-11 LAB — CBC
HCT: 29.7 % — ABNORMAL LOW (ref 39.0–52.0)
Hemoglobin: 10.1 g/dL — ABNORMAL LOW (ref 13.0–17.0)
MCH: 31.8 pg (ref 26.0–34.0)
MCHC: 34 g/dL (ref 30.0–36.0)
MCV: 93.4 fL (ref 80.0–100.0)
Platelets: 153 10*3/uL (ref 150–400)
RBC: 3.18 MIL/uL — ABNORMAL LOW (ref 4.22–5.81)
RDW: 18.3 % — ABNORMAL HIGH (ref 11.5–15.5)
WBC: 5.3 10*3/uL (ref 4.0–10.5)
nRBC: 0 % (ref 0.0–0.2)

## 2024-01-11 LAB — URINE CULTURE: Culture: >=100000 — AB

## 2024-01-11 MED ORDER — AMOXICILLIN 500 MG PO CAPS: 500.0000 mg | ORAL_CAPSULE | Freq: Three times a day (TID) | ORAL | 0 refills | Status: AC

## 2024-01-11 MED ORDER — ENSURE ENLIVE PO LIQD: 237.0000 mL | Freq: Two times a day (BID) | ORAL | Status: DC

## 2024-01-11 MED ORDER — AMOXICILLIN 500 MG PO CAPS
500.0000 mg | ORAL_CAPSULE | Freq: Three times a day (TID) | ORAL | Status: DC
  Administered 2024-01-11: 500 mg via ORAL
  Filled 2024-01-11 (×2): qty 1

## 2024-01-11 NOTE — Progress Notes (Signed)
Physical Therapy Treatment Patient Details Name: Terry [facility]io [facility] MRN: [id number] DOB: March 22, 1936 Today's Date: 01/11/2024   History of Present Illness 88 y.o. male with medical history significant for hypertension, dyslipidemia, coronary artery disease, status post urostomy for bladder cancer, and ischemic cardiomyopathy as well as urolithiasis, who presented to the emergency room with acute onset of hypotension and recent UTI for which the patient was seen in the morning and given antibiotic that he has not filled yet.  He has been having diminished appetite and has not been acting himself since yesterday.  He was seen by his primary care physician and later by his cardiologist at which time his BP was 73/36    PT Comments  Patient supine in bed upon arrival, agreeable to PT session this date. Patient denies pain at start of session, able to ambulate x 200 ft with RW. Additional of dynamic gait with patient able to complete with supervision. Patient Mod I with bed mobility. Patient continues to progress well with PT services. Discharge recommendation remains appropriate. OT present at end of session, therefore hand off completed. Will continue to follow acutely.    If plan is discharge home, recommend the following: Assist for transportation;Assistance with cooking/housework   Can travel by private Automotive engineer (2 wheels)    Recommendations for Other Services       Precautions / Restrictions Precautions Precautions: Fall Restrictions Weight Bearing Restrictions Per Provider Order: No     Mobility  Bed Mobility Overal bed mobility: Modified Independent             General bed mobility comments: Able to supine > sit, and sit > supine Mod I, increased time required, no rail assist needed.    Transfers Overall transfer level: Modified independent Equipment used: Rolling walker (2 wheels)               General transfer  comment: Pt able to complete sit > stand from bed height Mod I. no evidence of imbalance.    Ambulation/Gait Ambulation/Gait assistance: Supervision Gait Distance (Feet): 200 Feet Assistive device: Rolling walker (2 wheels)         General Gait Details: Patient ambulated with RW x 200 ft, improved stability noted with AD. Engaged patient in scanning environment, head turns for dynamic balance, supervision overall. Cues for posture and reduced UE reliance on AD. Pt tolerated well. No pain noted.   Stairs             Wheelchair Mobility     Tilt Bed    Modified Rankin (Stroke Patients Only)       Balance Overall balance assessment: Needs assistance Sitting-balance support: No upper extremity supported Sitting balance-Leahy Scale: Good       Standing balance-Leahy Scale: Good Standing balance comment: able to maintain balance without UE support, but improved stability noted with RW use.                            Cognition Arousal: Alert Behavior During Therapy: WFL for tasks assessed/performed Overall Cognitive Status: Within Functional Limits for tasks assessed                                          Exercises      General Comments General comments (skin integrity,  edema, etc.): Vitals stable, HR: 110-120's with activity      Pertinent Vitals/Pain Pain Assessment Pain Assessment: No/denies pain    Home Living                          Prior Function            PT Goals (current goals can now be found in the care plan section) Acute Rehab PT Goals Patient Stated Goal: Go Home Soon PT Goal Formulation: With patient/family Time For Goal Achievement: 01/23/24 Potential to Achieve Goals: Fair Progress towards PT goals: Progressing toward goals    Frequency    Min 1X/week      PT Plan      Co-evaluation              AM-PAC PT "6 Clicks" Mobility   Outcome Measure  Help needed turning from  your back to your side while in a flat bed without using bedrails?: None Help needed moving from lying on your back to sitting on the side of a flat bed without using bedrails?: None Help needed moving to and from a bed to a chair (including a wheelchair)?: None Help needed standing up from a chair using your arms (e.g., wheelchair or bedside chair)?: A Little Help needed to walk in hospital room?: A Little Help needed climbing 3-5 steps with a railing? : A Lot 6 Click Score: 20    End of Session Equipment Utilized During Treatment: Gait belt Activity Tolerance: Patient tolerated treatment well Patient left: in bed;with bed alarm set;with call bell/phone within reach;with family/visitor present Nurse Communication: Mobility status PT Visit Diagnosis: Muscle weakness (generalized) (M62.81);Difficulty in walking, not elsewhere classified (R26.2);Pain Pain - Right/Left: Left Pain - part of body: Knee     Time: 0981-1914 PT Time Calculation (min) (ACUTE ONLY): 15 min  Charges:    $Gait Training: 8-22 mins PT General Charges $$ ACUTE PT VISIT: 1 Visit                     Creed Copper Fairly, PT, DPT 01/11/24 10:00 AM

## 2024-01-11 NOTE — TOC Progression Note (Signed)
Transition of Care [facility]) - Progression Note    Patient Details  Name: Terry [name] MRN: [id number] Date of Birth: [birth year]  [facility]) CM/SW Contact  Chapman Fitch, RN Phone Number: 01/11/2024, 12:31 PM  Clinical Narrative:     Noted patient to dc today Kandee Keen with [facility]urb[facility] notified of discharge   Expected Discharge Plan: Home w Home Health Services Barriers to Discharge: Continued Medical Work up  Expected Discharge Plan and Services     Post Acute Care Choice: Home Health Living arrangements for the past 2 months: Single Family Home Expected Discharge Date: 01/11/24               DME Arranged: Dan Humphreys rolling DME Agency: [employer] Date DME Agency Contacted: 01/10/24 Time DME Agency Contacted: 1455 Representative spoke with at DME Agency: [facility] HH Arranged: PT, OT HH Agency: [facility] Date [facility] Agency Contacted: 01/10/24 Time HH Agency Contacted: 1455 Representative spoke with at [facility] Agency: Kandee Keen   Social Determinants of Health (SDOH) Interventions SDOH Screenings   Food Insecurity: No Food Insecurity (01/09/2024)  Housing: Unknown (01/09/2024)  Transportation Needs: No Transportation Needs (01/09/2024)  Utilities: Not At Risk (01/09/2024)  Alcohol Screen: Low Risk  (08/26/2019)  Depression (PHQ2-9): Low Risk  (02/01/2020)  Financial Resource Strain: Low Risk  (08/27/2023)   Received from [facility]  Physical Activity: Inactive (02/01/2020)  Social Connections: Socially Isolated (01/09/2024)  Stress: No Stress Concern Present (02/01/2020)  Tobacco Use: Medium Risk (01/08/2024)    Readmission Risk Interventions     No data to display

## 2024-01-11 NOTE — Evaluation (Signed)
Occupational Therapy Evaluation Patient Details Name: Terry [name] [name] MRN: [id number] DOB: October 02, 1936 Today's Date: 01/11/2024   History of Present Illness 88 y.o. male with medical history significant for hypertension, dyslipidemia, coronary artery disease, status post urostomy for bladder cancer, and ischemic cardiomyopathy as well as urolithiasis, who presented to the emergency room with acute onset of hypotension and recent UTI for which the patient was seen in the morning and given antibiotic that he has not filled yet.  He has been having diminished appetite and has not been acting himself since yesterday.  He was seen by his primary care physician and later by his cardiologist at which time his BP was 73/36   Clinical Impression   Pt was seen for OT evaluation this date. Prior to hospital admission, pt was  living at home with his daughter and IND with ADLs and mobility. Daughter assisted with household chores as needed, but pt also able to make his own coffee,etc.   Pt presents to acute OT demonstrating impaired ADL performance and functional mobility 2/2 weakness and low activity tolerance (See OT problem list for additional functional deficits). Pt currently requires MOD I for bed mobility and STS from EOB. OT provided SUP for LB dressing at EOB to don socks and long sweatpants with increased time/difficulty getting small legged pants over his feet. Edu on wearing pajama pants or looser pants at home until he builds his strength up more. Edu on walker tray to transport food items safely upon DC home. Edu on ECS and use of reacher for LB dressing as needed. Edu on BUE RROM HEP for strengthening using water bottles/canned goods or his theraband he has at home to prevent weakness and increase endurance for carryover to ADLs and functional transfers. Pt and daughter verbalized understanding.  Pt would benefit from skilled OT services to address noted impairments and functional limitations (see  below for any additional details) in order to maximize safety and independence while minimizing falls risk and caregiver burden. Do anticipate the need for follow up OT services upon acute hospital DC.        If plan is discharge home, recommend the following: A little help with walking and/or transfers;Assistance with cooking/housework    Functional Status Assessment  Patient has had a recent decline in their functional status and demonstrates the ability to make significant improvements in function in a reasonable and predictable amount of time.  Equipment Recommendations  Other (comment) (tray for walker to transport food at home)    Recommendations for Other Services       Precautions / Restrictions Precautions Precautions: Fall Restrictions Weight Bearing Restrictions Per Provider Order: No      Mobility Bed Mobility Overal bed mobility: Modified Independent                  Transfers Overall transfer level: Modified independent Equipment used: Rolling walker (2 wheels)               General transfer comment: Mod I for STS from EOB to pull pants over hips and back down with no issues or LOB      Balance Overall balance assessment: Needs assistance Sitting-balance support: No upper extremity supported Sitting balance-Leahy Scale: Good       Standing balance-Leahy Scale: Good Standing balance comment: no LOB in standing to perform LB dressing  ADL either performed or assessed with clinical judgement   ADL Overall ADL's : Needs assistance/impaired                     Lower Body Dressing: Sit to/from stand;Sitting/lateral leans;Supervision/safety                       Vision         Perception         Praxis         Pertinent Vitals/Pain Pain Assessment Pain Assessment: No/denies pain     Extremity/Trunk Assessment Upper Extremity Assessment Upper Extremity Assessment: Overall  WFL for tasks assessed   Lower Extremity Assessment Lower Extremity Assessment: Generalized weakness       Communication Communication Communication: Hearing impairment   Cognition Arousal: Alert Behavior During Therapy: WFL for tasks assessed/performed Overall Cognitive Status: Within Functional Limits for tasks assessed                                       General Comments  Vitals stable, HR: 110-120's with activity    Exercises Other Exercises Other Exercises: Edu on role of OT in acute setting and importance of therapy to maximize strength/safety/IND.   Shoulder Instructions      Home Living Family/patient expects to be discharged to:: Private residence Living Arrangements: Children Available Help at Discharge: Available PRN/intermittently (daughter works OOH)   Home Access: Stairs to enter Secretary/administrator of Steps: 4 Entrance Stairs-Rails: Can reach both                 Home Equipment: Cane - single point          Prior Functioning/Environment Prior Level of Function : Independent/Modified Independent;History of Falls (last six months)             Mobility Comments: Until recently pt has been able to manage all in home mobility w/o AD or much assist from daughter. ADLs Comments: Daughter does all OOH errands but pt is able to manage in home ADLs        OT Problem List: Decreased activity tolerance      OT Treatment/Interventions: Self-care/ADL training;Therapeutic exercise;Therapeutic activities;Energy conservation;Patient/family education;DME and/or AE instruction;Balance training    OT Goals(Current goals can be found in the care plan section) Acute Rehab OT Goals Patient Stated Goal: improve endurance OT Goal Formulation: With patient/family Time For Goal Achievement: 01/25/24 Potential to Achieve Goals: Good ADL Goals Pt Will Perform Lower Body Bathing: with modified independence;sit to/from stand;sitting/lateral  leans Pt Will Perform Lower Body Dressing: with modified independence;sit to/from stand;sitting/lateral leans Pt Will Transfer to Toilet: with modified independence;regular height toilet;ambulating Pt Will Perform Toileting - Clothing Manipulation and hygiene: with modified independence;sit to/from stand;sitting/lateral leans Pt/caregiver will Perform Home Exercise Program: Increased strength;Both right and left upper extremity;With theraband;Independently  OT Frequency: Min 1X/week    Co-evaluation              AM-PAC OT "6 Clicks" Daily Activity     Outcome Measure Help from another person eating meals?: None Help from another person taking care of personal grooming?: None Help from another person toileting, which includes using toliet, bedpan, or urinal?: A Little Help from another person bathing (including washing, rinsing, drying)?: A Little Help from another person to put on and taking off regular upper body clothing?: None Help from another  person to put on and taking off regular lower body clothing?: A Little 6 Click Score: 21   End of Session Nurse Communication: Mobility status  Activity Tolerance: Patient tolerated treatment well Patient left: in bed;with call bell/phone within reach;with bed alarm set;with family/visitor present  OT Visit Diagnosis: Other abnormalities of gait and mobility (R26.89)                Time: 4782-9562 OT Time Calculation (min): 38 min Charges:  OT General Charges $OT Visit: 1 Visit OT Evaluation $OT Eval Low Complexity: 1 Low OT Treatments $Self Care/Home Management : 23-37 mins Lovely Kerins, OTR/L 01/11/24, 11:55 AM  Tashema Tiller E Dick Hark 01/11/2024, 11:55 AM

## 2024-01-11 NOTE — Progress Notes (Signed)
Discharge instructions were reviewed with patient and family. Questions were encourage and answered. IV was removed. Belongins collected by family at bedside.,

## 2024-01-11 NOTE — Plan of Care (Signed)

## 2024-01-11 NOTE — Progress Notes (Signed)
   01/11/24 1300  Spiritual Encounters  Type of Visit Initial  Care provided to: Patient  Conversation partners present during encounter Nurse  Referral source Patient request;Family  Reason for visit Routine spiritual support  OnCall Visit No  Spiritual Framework  Presenting Themes Impactful experiences and emotions;Courage hope and growth  Community/Connection Family  Family Stress Factors None identified   Chaplain received spiritual consult to talk to family about an Advance directive. Chaplain educated the patients daughter about the paperwork. Daughter told chaplain she will take the paperwork and have it notarized with her bank.

## 2024-01-11 NOTE — Consult Note (Signed)
[facility] Liaison Note  01/11/2024  Lovelace Cerveny [facility] 02-07-1936 [id number]  Location: RN Hospital Liaison screened the patient remotely at [facility].  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 The patient was screened for readmission hospitalization with noted medium risk score for unplanned readmission risk with 2 IP/1 ED in 6 months.  The patient was assessed for potential Care Management service needs for post hospital transition for care coordination. Review of patient's electronic medical record reveals patient was admitted with Acute UTI. Pt will discharged home with [facility]. No anticipate needs for VBCI to address at this time. This provider will completed the transition of care follow up call.   VBCI Care Management/Population Health does not replace or interfere with any arrangements made by the Inpatient Transition of Care team.   For questions contact:   Elliot Cousin, RN, [facility] Liaison [place]   [facility], Population Health Office Hours MTWF  8:00 am-6:00 pm Direct Dial: [phone] mobile [phone] [Office toll free line] Office Hours are M-F 8:30 - 5 pm [email or web address]

## 2024-01-11 NOTE — Discharge Summary (Addendum)
Physician Discharge Summary   Patient: Terry [name] [facility] MRN: [id number] DOB: [birth year]  Admit date:     01/08/2024  Discharge date: 01/11/2024  Discharge Physician: Pennie Banter   PCP: Wilford Corner, PA-C   Recommendations at discharge:    Follow up with Primary Care in 1-2 weeks Repeat CBC, CMP, Mg at follow up  Discharge Diagnoses: Principal Problem:   Acute lower UTI Active Problems:   Elevated transaminase level   Dyslipidemia   Coronary artery disease  Resolved Problems:   AKI (acute kidney injury) [facility])  Hospital Course:  HPI on admission 01/08/2024 PM: "Terry [name] is a 88 y.o. male with medical history significant for hypertension, dyslipidemia, coronary artery disease, status post urostomy for bladder cancer, and ischemic cardiomyopathy as well as urolithiasis, who presented to the emergency room with acute onset of hypotension and recent UTI for which the patient was seen in the morning and given antibiotic that he has not filled yet.  He has been having diminished appetite and has not been acting himself since yesterday.  He was seen by his primary care physician and later by his cardiologist at which time his BP was 73/36 and was referred to the emergency room. ..." See H&P for full HPI on admission & ED course.   Pt was admitted for further evaluation and management of acute lower UTI, started on empiric IV antibiotics pending culture results.  Further hospital course and management as outlined below.   1/27 -- pt reports feeling better.  He is medically stable and requesting discharge home today.  Assessment and Plan:  Acute lower UTI UTI clinically undetermined if related to ileal conduit with urostomy  Pt did not meet sepsis criteria on admission - no SIRS criteria and normal lactic acid. --Treated with empiric IV Rocephin --Urine culture grew Enterococcus faecalis  --Discharge on 7 day course of amoxicillin    AKI (acute  kidney injury) - POA with Cr 1.48 on admission. Resolved. Baseline Cr appears 1.0-1.2. Pre-renal etiology in setting of hypotension, volume depletion. Placed on normal saline on admission, now with hyperchloremic metabolic acidosis. --Stable off IV fluids --Encourage PO hydration --Monitor BMP at follow up   Hyperchloremic metabolic acidosis - bicarb is improved 15 >> 17>> 20 --Changed IV fluids to LR -- now off fluids --PO sodium bicarb x 2 doses given 1/25 --Repeat BMP at follow up    Elevated transaminase level - improved - Follow LFTs.   Coronary artery disease - stable no chest pain. STEMI noted back in September, cath report reviewed - Continue home Brilinta  --PRN sublingual nitroglycerin. --Follow up with Cardiology   Dyslipidemia --Statin held on admission with mildly elevated LFT's --Resumed statin        Consultants: None Procedures performed: None  Disposition: Home health Diet recommendation:  Cardiac diet DISCHARGE MEDICATION: Allergies as of 01/11/2024       Reactions   Triprolidine-pseudoephedrine Rash, Hives   Triprolidine-pseudoephedrine Hives, Rash   Dapsone Other (See Comments)   Methemoglobinemia   Other reaction(s): Unknown  Methemoglobinemia  Methemoglobinemia   Methemoglobinemia   Other reaction(s): Unknown  Methemoglobinemia   Sudafed Pe Cold-cough  [phenylephrine-dm-gg-apap] Hives   Actifed Cold-allergy  [chlorpheniramine-phenylephrine] Rash        Medication List     TAKE these medications    acetaminophen 325 MG tablet Commonly known as: TYLENOL Take 650 mg by mouth every 6 (six) hours as needed for mild pain.   CAVILON NO STING BARRIER FILM EX  Apply topically as needed.   cetirizine 10 MG tablet Commonly known as: ZYRTEC Take 10 mg by mouth daily.   diphenhydramine-acetaminophen 25-500 MG Tabs tablet Commonly known as: TYLENOL PM Take 1 tablet by mouth at bedtime as needed (for sleep).   feeding supplement  Liqd Take 237 mLs by mouth 2 (two) times daily between meals.   methotrexate 2.5 MG tablet Commonly known as: RHEUMATREX Take 15 mg by mouth once a week. Takes on Friday - Last dose was 1/17   neomycin-bacitracin-polymyxin [phone] ointment Apply 1 application topically as needed.   nitroGLYCERIN 0.4 MG SL tablet Commonly known as: NITROSTAT Place 1 tablet (0.4 mg total) under the tongue every 5 (five) minutes x 3 doses as needed for chest pain.   ondansetron 4 MG tablet Commonly known as: Zofran Take 1 tablet (4 mg total) by mouth daily as needed for nausea or vomiting.   rosuvastatin 20 MG tablet Commonly known as: CRESTOR Take 1 tablet (20 mg total) by mouth daily.   SenSura Drainable Pouch Misc Ostomy supplies as needed. Diagnosis: Bladder Cancer (C67.9)   ticagrelor 90 MG Tabs tablet Commonly known as: Brilinta Take 1 tablet (90 mg total) by mouth 2 (two) times daily.   triamcinolone cream 0.1 % Commonly known as: KENALOG Apply 1 application topically 2 (two) times daily.   VITAMIN D PO Take by mouth daily in the afternoon.       ASK your doctor about these medications    amoxicillin 500 MG capsule Commonly known as: AMOXIL Take 1 capsule (500 mg total) by mouth 3 (three) times daily for 7 days. Ask about: Should I take this medication?        Discharge Exam: Filed Weights   01/09/24 2011  Weight: 50.7 kg   General exam: awake, alert, no acute distress HEENT: atraumatic, clear conjunctiva, anicteric sclera, moist mucus membranes, hearing grossly normal Respiratory system: CTAB, no wheezes, rales or rhonchi, normal respiratory effort. Cardiovascular system: normal S1/S2, RRR, no pedal edema.   Gastrointestinal system: soft, NT, ND Central nervous system: A&O x 3. no gross focal neurologic deficits, normal speech Extremities: moves all, no edema, normal tone Skin: dry, intact, normal temperature Psychiatry: normal mood, congruent affect, judgement  and insight appear normal   Condition at discharge: stable  The results of significant diagnostics from this hospitalization (including imaging, microbiology, ancillary and laboratory) are listed below for reference.   Imaging Studies: DG Chest 2 View Result Date: 01/08/2024 CLINICAL DATA:  Hypotension.  Recent history of weight loss. EXAM: CHEST - 2 VIEW COMPARISON:  None Available. FINDINGS: The heart size and mediastinal contours are within normal limits. Right-sided Port-A-Cath tip overlies the superior cavoatrial junction. Hyperinflation. Biapical pleural-parenchymal scarring. No focal consolidation, pleural effusion, or pneumothorax. No acute osseous abnormality. IMPRESSION: Hyperinflation.  No acute cardiopulmonary findings. Electronically Signed   By: Hart Robinsons M.D.   On: 01/08/2024 16:01    Microbiology: Results for orders placed or performed during the hospital encounter of 01/08/24  Blood Culture (routine x 2)     Status: None   Collection Time: 01/08/24 10:45 PM   Specimen: Right Antecubital; Blood  Result Value Ref Range Status   Specimen Description RIGHT ANTECUBITAL  Final   Special Requests   Final    BOTTLES DRAWN AEROBIC AND ANAEROBIC Blood Culture results may not be optimal due to an inadequate volume of blood received in culture bottles   Culture   Final    NO GROWTH 5 DAYS Performed  at [facility], [address] [place], [place] [address]    Report Status 01/13/2024 FINAL  Final  Blood Culture (routine x 2)     Status: None   Collection Time: 01/08/24 10:45 PM   Specimen: Left Antecubital; Blood  Result Value Ref Range Status   Specimen Description LEFT ANTECUBITAL  Final   Special Requests   Final    BOTTLES DRAWN AEROBIC AND ANAEROBIC Blood Culture results may not be optimal due to an inadequate volume of blood received in culture bottles   Culture   Final    NO GROWTH 5 DAYS Performed at [facility], [address]  [place], [place] [address]    Report Status 01/13/2024 FINAL  Final  Resp panel by RT-PCR (RSV, Flu A&B, Covid) Anterior Nasal Swab     Status: None   Collection Time: 01/08/24 10:50 PM   Specimen: Anterior Nasal Swab  Result Value Ref Range Status   SARS Coronavirus 2 by RT PCR NEGATIVE NEGATIVE Final    Comment: (NOTE) SARS-CoV-2 target nucleic acids are NOT DETECTED.  The SARS-CoV-2 RNA is generally detectable in upper respiratory specimens during the acute phase of infection. The lowest concentration of SARS-CoV-2 viral copies this assay can detect is 138 copies/mL. A negative result does not preclude SARS-Cov-2 infection and should not be used as the sole basis for treatment or other patient management decisions. A negative result may occur with  improper specimen collection/handling, submission of specimen other than nasopharyngeal swab, presence of viral mutation(s) within the areas targeted by this assay, and inadequate number of viral copies(<138 copies/mL). A negative result must be combined with clinical observations, patient history, and epidemiological information. The expected result is Negative.  Fact Sheet for Patients:  BloggerCourse.com  Fact Sheet for Healthcare Providers:  SeriousBroker.it  This test is no t yet approved or cleared by the [place] FDA and  has been authorized for detection and/or diagnosis of SARS-CoV-2 by FDA under an Emergency Use Authorization (EUA). This EUA will remain  in effect (meaning this test can be used) for the duration of the COVID-19 declaration under Section 564(b)(1) of the Act, 21 U.S.C.section 360bbb-3(b)(1), unless the authorization is terminated  or revoked sooner.       Influenza A by PCR NEGATIVE NEGATIVE Final   Influenza B by PCR NEGATIVE NEGATIVE Final    Comment: (NOTE) The Xpert Xpress SARS-CoV-2/FLU/RSV plus assay is intended as an aid in the diagnosis of  influenza from Nasopharyngeal swab specimens and should not be used as a sole basis for treatment. Nasal washings and aspirates are unacceptable for Xpert Xpress SARS-CoV-2/FLU/RSV testing.  Fact Sheet for Patients: BloggerCourse.com  Fact Sheet for Healthcare Providers: SeriousBroker.it  This test is not yet approved or cleared by the [place] FDA and has been authorized for detection and/or diagnosis of SARS-CoV-2 by FDA under an Emergency Use Authorization (EUA). This EUA will remain in effect (meaning this test can be used) for the duration of the COVID-19 declaration under Section 564(b)(1) of the Act, 21 U.S.C. section 360bbb-3(b)(1), unless the authorization is terminated or revoked.     Resp Syncytial Virus by PCR NEGATIVE NEGATIVE Final    Comment: (NOTE) Fact Sheet for Patients: BloggerCourse.com  Fact Sheet for Healthcare Providers: SeriousBroker.it  This test is not yet approved or cleared by the [place] FDA and has been authorized for detection and/or diagnosis of SARS-CoV-2 by FDA under an Emergency Use Authorization (EUA). This EUA will remain in  effect (meaning this test can be used) for the duration of the COVID-19 declaration under Section 564(b)(1) of the Act, 21 U.S.C. section 360bbb-3(b)(1), unless the authorization is terminated or revoked.  Performed at [facility], [address] [place], [place] [address]   Urine Culture     Status: Abnormal   Collection Time: 01/09/24  5:44 AM   Specimen: Urine, Random  Result Value Ref Range Status   Specimen Description   Final    URINE, RANDOM Performed at [facility], [address] [place], [place] [address]    Special Requests   Final    NONE Reflexed from [id number] Performed at [facility], [address] [place], [place] [address]    Culture >=100,000  COLONIES/mL ENTEROCOCCUS FAECALIS (A)  Final   Report Status 01/11/2024 FINAL  Final   Organism ID, Bacteria ENTEROCOCCUS FAECALIS (A)  Final      Susceptibility   Enterococcus faecalis - MIC*    AMPICILLIN <=2 SENSITIVE Sensitive     NITROFURANTOIN <=16 SENSITIVE Sensitive     VANCOMYCIN 1 SENSITIVE Sensitive     * >=100,000 COLONIES/mL ENTEROCOCCUS FAECALIS    Labs: CBC: No results for input(s): "WBC", "NEUTROABS", "HGB", "HCT", "MCV", "PLT" in the last 168 hours.  Basic Metabolic Panel: No results for input(s): "NA", "K", "CL", "CO2", "GLUCOSE", "BUN", "CREATININE", "CALCIUM", "MG", "PHOS" in the last 168 hours.  Liver Function Tests: No results for input(s): "AST", "ALT", "ALKPHOS", "BILITOT", "PROT", "ALBUMIN" in the last 168 hours.  CBG: No results for input(s): "GLUCAP" in the last 168 hours.  Discharge time spent: less than 30 minutes.  Signed: Pennie Banter, DO [facility] 01/21/2024

## 2024-01-12 DIAGNOSIS — L89892 Pressure ulcer of other site, stage 2: Secondary | ICD-10-CM | POA: Diagnosis not present

## 2024-01-12 DIAGNOSIS — I255 Ischemic cardiomyopathy: Secondary | ICD-10-CM | POA: Diagnosis not present

## 2024-01-12 DIAGNOSIS — Z436 Encounter for attention to other artificial openings of urinary tract: Secondary | ICD-10-CM | POA: Diagnosis not present

## 2024-01-12 DIAGNOSIS — L89152 Pressure ulcer of sacral region, stage 2: Secondary | ICD-10-CM | POA: Diagnosis not present

## 2024-01-12 DIAGNOSIS — I2510 Atherosclerotic heart disease of native coronary artery without angina pectoris: Secondary | ICD-10-CM | POA: Diagnosis not present

## 2024-01-12 DIAGNOSIS — I252 Old myocardial infarction: Secondary | ICD-10-CM | POA: Diagnosis not present

## 2024-01-12 DIAGNOSIS — N390 Urinary tract infection, site not specified: Secondary | ICD-10-CM | POA: Diagnosis not present

## 2024-01-12 DIAGNOSIS — I959 Hypotension, unspecified: Secondary | ICD-10-CM | POA: Diagnosis not present

## 2024-01-12 DIAGNOSIS — I10 Essential (primary) hypertension: Secondary | ICD-10-CM | POA: Diagnosis not present

## 2024-01-13 LAB — CULTURE, BLOOD (ROUTINE X 2)
Culture: NO GROWTH
Culture: NO GROWTH

## 2024-01-14 DIAGNOSIS — L89892 Pressure ulcer of other site, stage 2: Secondary | ICD-10-CM | POA: Diagnosis not present

## 2024-01-14 DIAGNOSIS — I252 Old myocardial infarction: Secondary | ICD-10-CM | POA: Diagnosis not present

## 2024-01-14 DIAGNOSIS — Z436 Encounter for attention to other artificial openings of urinary tract: Secondary | ICD-10-CM | POA: Diagnosis not present

## 2024-01-14 DIAGNOSIS — I2510 Atherosclerotic heart disease of native coronary artery without angina pectoris: Secondary | ICD-10-CM | POA: Diagnosis not present

## 2024-01-14 DIAGNOSIS — I255 Ischemic cardiomyopathy: Secondary | ICD-10-CM | POA: Diagnosis not present

## 2024-01-14 DIAGNOSIS — I10 Essential (primary) hypertension: Secondary | ICD-10-CM | POA: Diagnosis not present

## 2024-01-14 DIAGNOSIS — I959 Hypotension, unspecified: Secondary | ICD-10-CM | POA: Diagnosis not present

## 2024-01-14 DIAGNOSIS — N390 Urinary tract infection, site not specified: Secondary | ICD-10-CM | POA: Diagnosis not present

## 2024-01-14 DIAGNOSIS — L89152 Pressure ulcer of sacral region, stage 2: Secondary | ICD-10-CM | POA: Diagnosis not present

## 2024-01-15 ENCOUNTER — Ambulatory Visit: Payer: Medicare HMO | Admitting: Physician Assistant

## 2024-01-21 ENCOUNTER — Encounter: Payer: Self-pay | Admitting: Family Medicine

## 2024-01-21 DIAGNOSIS — N390 Urinary tract infection, site not specified: Secondary | ICD-10-CM | POA: Diagnosis not present

## 2024-01-21 DIAGNOSIS — I252 Old myocardial infarction: Secondary | ICD-10-CM | POA: Diagnosis not present

## 2024-01-21 DIAGNOSIS — I10 Essential (primary) hypertension: Secondary | ICD-10-CM | POA: Diagnosis not present

## 2024-01-21 DIAGNOSIS — I255 Ischemic cardiomyopathy: Secondary | ICD-10-CM | POA: Diagnosis not present

## 2024-01-21 DIAGNOSIS — Z436 Encounter for attention to other artificial openings of urinary tract: Secondary | ICD-10-CM | POA: Diagnosis not present

## 2024-01-21 DIAGNOSIS — L89152 Pressure ulcer of sacral region, stage 2: Secondary | ICD-10-CM | POA: Diagnosis not present

## 2024-01-21 DIAGNOSIS — I959 Hypotension, unspecified: Secondary | ICD-10-CM | POA: Diagnosis not present

## 2024-01-21 DIAGNOSIS — L89892 Pressure ulcer of other site, stage 2: Secondary | ICD-10-CM | POA: Diagnosis not present

## 2024-01-21 DIAGNOSIS — I2510 Atherosclerotic heart disease of native coronary artery without angina pectoris: Secondary | ICD-10-CM | POA: Diagnosis not present

## 2024-01-28 DIAGNOSIS — L89152 Pressure ulcer of sacral region, stage 2: Secondary | ICD-10-CM | POA: Diagnosis not present

## 2024-01-28 DIAGNOSIS — I252 Old myocardial infarction: Secondary | ICD-10-CM | POA: Diagnosis not present

## 2024-01-28 DIAGNOSIS — Z436 Encounter for attention to other artificial openings of urinary tract: Secondary | ICD-10-CM | POA: Diagnosis not present

## 2024-01-28 DIAGNOSIS — N390 Urinary tract infection, site not specified: Secondary | ICD-10-CM | POA: Diagnosis not present

## 2024-01-28 DIAGNOSIS — I10 Essential (primary) hypertension: Secondary | ICD-10-CM | POA: Diagnosis not present

## 2024-01-28 DIAGNOSIS — L89892 Pressure ulcer of other site, stage 2: Secondary | ICD-10-CM | POA: Diagnosis not present

## 2024-01-28 DIAGNOSIS — I2510 Atherosclerotic heart disease of native coronary artery without angina pectoris: Secondary | ICD-10-CM | POA: Diagnosis not present

## 2024-01-28 DIAGNOSIS — I959 Hypotension, unspecified: Secondary | ICD-10-CM | POA: Diagnosis not present

## 2024-01-29 DIAGNOSIS — Z87440 Personal history of urinary (tract) infections: Secondary | ICD-10-CM | POA: Diagnosis not present

## 2024-01-29 DIAGNOSIS — I10 Essential (primary) hypertension: Secondary | ICD-10-CM | POA: Diagnosis not present

## 2024-01-29 DIAGNOSIS — Z09 Encounter for follow-up examination after completed treatment for conditions other than malignant neoplasm: Secondary | ICD-10-CM | POA: Diagnosis not present

## 2024-02-02 DIAGNOSIS — N390 Urinary tract infection, site not specified: Secondary | ICD-10-CM | POA: Diagnosis not present

## 2024-02-02 DIAGNOSIS — I2510 Atherosclerotic heart disease of native coronary artery without angina pectoris: Secondary | ICD-10-CM | POA: Diagnosis not present

## 2024-02-02 DIAGNOSIS — L89892 Pressure ulcer of other site, stage 2: Secondary | ICD-10-CM | POA: Diagnosis not present

## 2024-02-02 DIAGNOSIS — L89152 Pressure ulcer of sacral region, stage 2: Secondary | ICD-10-CM | POA: Diagnosis not present

## 2024-02-02 DIAGNOSIS — I959 Hypotension, unspecified: Secondary | ICD-10-CM | POA: Diagnosis not present

## 2024-02-02 DIAGNOSIS — Z436 Encounter for attention to other artificial openings of urinary tract: Secondary | ICD-10-CM | POA: Diagnosis not present

## 2024-02-02 DIAGNOSIS — I10 Essential (primary) hypertension: Secondary | ICD-10-CM | POA: Diagnosis not present

## 2024-02-05 ENCOUNTER — Other Ambulatory Visit (HOSPITAL_COMMUNITY): Payer: Self-pay

## 2024-02-09 ENCOUNTER — Inpatient Hospital Stay
Admission: EM | Admit: 2024-02-09 | Discharge: 2024-02-24 | DRG: 177 | Disposition: A | Discharge status: Hospice | Payer: Medicare HMO | Attending: Obstetrics and Gynecology | Admitting: Obstetrics and Gynecology

## 2024-02-09 ENCOUNTER — Emergency Department: Payer: Medicare HMO

## 2024-02-09 ENCOUNTER — Other Ambulatory Visit: Payer: Self-pay

## 2024-02-09 DIAGNOSIS — N3000 Acute cystitis without hematuria: Secondary | ICD-10-CM

## 2024-02-09 DIAGNOSIS — E7800 Pure hypercholesterolemia, unspecified: Secondary | ICD-10-CM | POA: Diagnosis not present

## 2024-02-09 DIAGNOSIS — D649 Anemia, unspecified: Secondary | ICD-10-CM | POA: Diagnosis not present

## 2024-02-09 DIAGNOSIS — Z8551 Personal history of malignant neoplasm of bladder: Secondary | ICD-10-CM | POA: Diagnosis not present

## 2024-02-09 DIAGNOSIS — R627 Adult failure to thrive: Secondary | ICD-10-CM | POA: Diagnosis not present

## 2024-02-09 DIAGNOSIS — D72819 Decreased white blood cell count, unspecified: Secondary | ICD-10-CM

## 2024-02-09 DIAGNOSIS — R4182 Altered mental status, unspecified: Secondary | ICD-10-CM | POA: Diagnosis not present

## 2024-02-09 DIAGNOSIS — Z681 Body mass index (BMI) 19 or less, adult: Secondary | ICD-10-CM | POA: Diagnosis not present

## 2024-02-09 DIAGNOSIS — R531 Weakness: Secondary | ICD-10-CM | POA: Diagnosis not present

## 2024-02-09 DIAGNOSIS — Z87442 Personal history of urinary calculi: Secondary | ICD-10-CM

## 2024-02-09 DIAGNOSIS — Z66 Do not resuscitate: Secondary | ICD-10-CM | POA: Diagnosis not present

## 2024-02-09 DIAGNOSIS — N179 Acute kidney failure, unspecified: Secondary | ICD-10-CM | POA: Diagnosis not present

## 2024-02-09 DIAGNOSIS — L89159 Pressure ulcer of sacral region, unspecified stage: Secondary | ICD-10-CM | POA: Diagnosis present

## 2024-02-09 DIAGNOSIS — N390 Urinary tract infection, site not specified: Principal | ICD-10-CM | POA: Diagnosis present

## 2024-02-09 DIAGNOSIS — I4891 Unspecified atrial fibrillation: Secondary | ICD-10-CM | POA: Diagnosis not present

## 2024-02-09 DIAGNOSIS — E43 Unspecified severe protein-calorie malnutrition: Secondary | ICD-10-CM | POA: Diagnosis not present

## 2024-02-09 DIAGNOSIS — Z8249 Family history of ischemic heart disease and other diseases of the circulatory system: Secondary | ICD-10-CM

## 2024-02-09 DIAGNOSIS — E872 Acidosis, unspecified: Secondary | ICD-10-CM | POA: Diagnosis present

## 2024-02-09 DIAGNOSIS — S299XXA Unspecified injury of thorax, initial encounter: Secondary | ICD-10-CM | POA: Diagnosis not present

## 2024-02-09 DIAGNOSIS — Z888 Allergy status to other drugs, medicaments and biological substances status: Secondary | ICD-10-CM

## 2024-02-09 DIAGNOSIS — I5032 Chronic diastolic (congestive) heart failure: Secondary | ICD-10-CM | POA: Diagnosis present

## 2024-02-09 DIAGNOSIS — R188 Other ascites: Secondary | ICD-10-CM | POA: Diagnosis not present

## 2024-02-09 DIAGNOSIS — R319 Hematuria, unspecified: Secondary | ICD-10-CM | POA: Diagnosis not present

## 2024-02-09 DIAGNOSIS — D61818 Other pancytopenia: Secondary | ICD-10-CM | POA: Diagnosis not present

## 2024-02-09 DIAGNOSIS — E162 Hypoglycemia, unspecified: Secondary | ICD-10-CM | POA: Diagnosis present

## 2024-02-09 DIAGNOSIS — R54 Age-related physical debility: Secondary | ICD-10-CM | POA: Diagnosis present

## 2024-02-09 DIAGNOSIS — L120 Bullous pemphigoid: Secondary | ICD-10-CM | POA: Diagnosis present

## 2024-02-09 DIAGNOSIS — I110 Hypertensive heart disease with heart failure: Secondary | ICD-10-CM | POA: Diagnosis present

## 2024-02-09 DIAGNOSIS — Z515 Encounter for palliative care: Secondary | ICD-10-CM

## 2024-02-09 DIAGNOSIS — G9341 Metabolic encephalopathy: Secondary | ICD-10-CM | POA: Diagnosis not present

## 2024-02-09 DIAGNOSIS — R638 Other symptoms and signs concerning food and fluid intake: Secondary | ICD-10-CM | POA: Diagnosis not present

## 2024-02-09 DIAGNOSIS — E161 Other hypoglycemia: Secondary | ICD-10-CM | POA: Diagnosis not present

## 2024-02-09 DIAGNOSIS — D696 Thrombocytopenia, unspecified: Secondary | ICD-10-CM | POA: Diagnosis present

## 2024-02-09 DIAGNOSIS — I6782 Cerebral ischemia: Secondary | ICD-10-CM | POA: Diagnosis not present

## 2024-02-09 DIAGNOSIS — Z711 Person with feared health complaint in whom no diagnosis is made: Secondary | ICD-10-CM | POA: Diagnosis not present

## 2024-02-09 DIAGNOSIS — R410 Disorientation, unspecified: Secondary | ICD-10-CM | POA: Diagnosis not present

## 2024-02-09 DIAGNOSIS — R64 Cachexia: Secondary | ICD-10-CM | POA: Diagnosis present

## 2024-02-09 DIAGNOSIS — Z7189 Other specified counseling: Secondary | ICD-10-CM | POA: Diagnosis not present

## 2024-02-09 DIAGNOSIS — N200 Calculus of kidney: Secondary | ICD-10-CM | POA: Diagnosis not present

## 2024-02-09 DIAGNOSIS — R9431 Abnormal electrocardiogram [ECG] [EKG]: Secondary | ICD-10-CM | POA: Diagnosis not present

## 2024-02-09 DIAGNOSIS — G9389 Other specified disorders of brain: Secondary | ICD-10-CM | POA: Diagnosis not present

## 2024-02-09 DIAGNOSIS — Z1619 Resistance to other specified beta lactam antibiotics: Secondary | ICD-10-CM | POA: Diagnosis present

## 2024-02-09 DIAGNOSIS — R918 Other nonspecific abnormal finding of lung field: Secondary | ICD-10-CM | POA: Diagnosis not present

## 2024-02-09 DIAGNOSIS — Z820 Family history of epilepsy and other diseases of the nervous system: Secondary | ICD-10-CM

## 2024-02-09 DIAGNOSIS — R0602 Shortness of breath: Secondary | ICD-10-CM | POA: Diagnosis not present

## 2024-02-09 DIAGNOSIS — I959 Hypotension, unspecified: Secondary | ICD-10-CM | POA: Diagnosis not present

## 2024-02-09 DIAGNOSIS — Z7401 Bed confinement status: Secondary | ICD-10-CM | POA: Diagnosis not present

## 2024-02-09 DIAGNOSIS — I10 Essential (primary) hypertension: Secondary | ICD-10-CM | POA: Diagnosis present

## 2024-02-09 DIAGNOSIS — Z7902 Long term (current) use of antithrombotics/antiplatelets: Secondary | ICD-10-CM

## 2024-02-09 DIAGNOSIS — E860 Dehydration: Secondary | ICD-10-CM | POA: Diagnosis present

## 2024-02-09 DIAGNOSIS — R1310 Dysphagia, unspecified: Secondary | ICD-10-CM | POA: Diagnosis present

## 2024-02-09 DIAGNOSIS — U071 COVID-19: Principal | ICD-10-CM | POA: Diagnosis present

## 2024-02-09 DIAGNOSIS — R0902 Hypoxemia: Secondary | ICD-10-CM | POA: Diagnosis not present

## 2024-02-09 DIAGNOSIS — I2510 Atherosclerotic heart disease of native coronary artery without angina pectoris: Secondary | ICD-10-CM | POA: Diagnosis present

## 2024-02-09 DIAGNOSIS — Z79899 Other long term (current) drug therapy: Secondary | ICD-10-CM

## 2024-02-09 DIAGNOSIS — J90 Pleural effusion, not elsewhere classified: Secondary | ICD-10-CM | POA: Diagnosis not present

## 2024-02-09 DIAGNOSIS — J439 Emphysema, unspecified: Secondary | ICD-10-CM | POA: Diagnosis not present

## 2024-02-09 DIAGNOSIS — Z955 Presence of coronary angioplasty implant and graft: Secondary | ICD-10-CM

## 2024-02-09 DIAGNOSIS — E876 Hypokalemia: Secondary | ICD-10-CM | POA: Diagnosis present

## 2024-02-09 DIAGNOSIS — I255 Ischemic cardiomyopathy: Secondary | ICD-10-CM | POA: Diagnosis present

## 2024-02-09 DIAGNOSIS — Z87891 Personal history of nicotine dependence: Secondary | ICD-10-CM

## 2024-02-09 DIAGNOSIS — B9689 Other specified bacterial agents as the cause of diseases classified elsewhere: Secondary | ICD-10-CM | POA: Diagnosis present

## 2024-02-09 DIAGNOSIS — Z8711 Personal history of peptic ulcer disease: Secondary | ICD-10-CM

## 2024-02-09 DIAGNOSIS — Z8042 Family history of malignant neoplasm of prostate: Secondary | ICD-10-CM

## 2024-02-09 DIAGNOSIS — Z833 Family history of diabetes mellitus: Secondary | ICD-10-CM

## 2024-02-09 LAB — URINALYSIS, ROUTINE W REFLEX MICROSCOPIC
Bilirubin Urine: NEGATIVE
Glucose, UA: NEGATIVE mg/dL
Ketones, ur: 5 mg/dL — AB
Nitrite: NEGATIVE
Protein, ur: 100 mg/dL — AB
RBC / HPF: >50 RBC/hpf (ref 0–5)
Specific Gravity, Urine: 1.011 (ref 1.005–1.030)
Squamous Epithelial / HPF: 0 /[HPF] (ref 0–5)
WBC, UA: >50 WBC/hpf (ref 0–5)
pH: 6 (ref 5.0–8.0)

## 2024-02-09 LAB — RESP PANEL BY RT-PCR (RSV, FLU A&B, COVID)  RVPGX2
Influenza A by PCR: NEGATIVE
Influenza B by PCR: NEGATIVE
Resp Syncytial Virus by PCR: NEGATIVE
SARS Coronavirus 2 by RT PCR: POSITIVE — AB

## 2024-02-09 LAB — CBC
HCT: 36 % — ABNORMAL LOW (ref 39.0–52.0)
Hemoglobin: 11.7 g/dL — ABNORMAL LOW (ref 13.0–17.0)
MCH: 31.7 pg (ref 26.0–34.0)
MCHC: 32.5 g/dL (ref 30.0–36.0)
MCV: 97.6 fL (ref 80.0–100.0)
Platelets: 92 10*3/uL — ABNORMAL LOW (ref 150–400)
RBC: 3.69 MIL/uL — ABNORMAL LOW (ref 4.22–5.81)
RDW: 18 % — ABNORMAL HIGH (ref 11.5–15.5)
WBC: 8 10*3/uL (ref 4.0–10.5)
nRBC: 0 % (ref 0.0–0.2)

## 2024-02-09 LAB — MAGNESIUM: Magnesium: 2.2 mg/dL (ref 1.7–2.4)

## 2024-02-09 LAB — TECHNOLOGIST SMEAR REVIEW: Plt Morphology: NORMAL

## 2024-02-09 LAB — BASIC METABOLIC PANEL
Anion gap: 9 (ref 5–15)
BUN: 28 mg/dL — ABNORMAL HIGH (ref 8–23)
CO2: 19 mmol/L — ABNORMAL LOW (ref 22–32)
Calcium: 9 mg/dL (ref 8.9–10.3)
Chloride: 109 mmol/L (ref 98–111)
Creatinine, Ser: 1.39 mg/dL — ABNORMAL HIGH (ref 0.61–1.24)
GFR, Estimated: 50 mL/min — ABNORMAL LOW (ref >60)
Glucose, Bld: 109 mg/dL — ABNORMAL HIGH (ref 70–99)
Potassium: 3 mmol/L — ABNORMAL LOW (ref 3.5–5.1)
Sodium: 137 mmol/L (ref 135–145)

## 2024-02-09 LAB — LACTATE DEHYDROGENASE: LDH: 276 U/L — ABNORMAL HIGH (ref 98–192)

## 2024-02-09 LAB — PHOSPHORUS: Phosphorus: 1.8 mg/dL — ABNORMAL LOW (ref 2.5–4.6)

## 2024-02-09 LAB — GLUCOSE, CAPILLARY: Glucose-Capillary: 105 mg/dL — ABNORMAL HIGH (ref 70–99)

## 2024-02-09 MED ORDER — SODIUM CHLORIDE 0.9 % IV SOLN
1.0000 g | INTRAVENOUS | Status: DC
  Administered 2024-02-09 – 2024-02-11 (×3): 1 g via INTRAVENOUS
  Filled 2024-02-09 (×3): qty 10

## 2024-02-09 MED ORDER — ONDANSETRON HCL 4 MG/2ML IJ SOLN: 4.0000 mg | Freq: Three times a day (TID) | INTRAMUSCULAR | Status: DC | PRN

## 2024-02-09 MED ORDER — HYDRALAZINE HCL 20 MG/ML IJ SOLN: 5.0000 mg | INTRAMUSCULAR | Status: DC | PRN

## 2024-02-09 MED ORDER — SODIUM CHLORIDE 0.9 % IV SOLN: 1.0000 g | Freq: Once | INTRAVENOUS | Status: DC

## 2024-02-09 MED ORDER — POTASSIUM CHLORIDE CRYS ER 20 MEQ PO TBCR
40.0000 meq | EXTENDED_RELEASE_TABLET | Freq: Once | ORAL | Status: AC
  Administered 2024-02-09: 40 meq via ORAL
  Filled 2024-02-09: qty 2

## 2024-02-09 MED ORDER — ALBUTEROL SULFATE (2.5 MG/3ML) 0.083% IN NEBU: 2.5000 mg | INHALATION_SOLUTION | RESPIRATORY_TRACT | Status: DC | PRN

## 2024-02-09 MED ORDER — DEXTROSE-SODIUM CHLORIDE 5-0.9 % IV SOLN: INTRAVENOUS | Status: DC

## 2024-02-09 MED ORDER — ACETAMINOPHEN 325 MG PO TABS
650.0000 mg | ORAL_TABLET | Freq: Four times a day (QID) | ORAL | Status: DC | PRN
  Filled 2024-02-09 (×2): qty 2

## 2024-02-09 MED ORDER — DEXTROSE 50 % IV SOLN: 50.0000 mL | INTRAVENOUS | Status: DC | PRN

## 2024-02-09 MED ORDER — POTASSIUM CHLORIDE 10 MEQ/100ML IV SOLN
10.0000 meq | INTRAVENOUS | Status: AC
  Administered 2024-02-09 (×2): 10 meq via INTRAVENOUS
  Filled 2024-02-09 (×2): qty 100

## 2024-02-09 MED ORDER — SODIUM CHLORIDE 0.9 % IV SOLN: INTRAVENOUS | Status: DC

## 2024-02-09 MED ORDER — GUAIFENESIN 100 MG/5ML PO LIQD: 200.0000 mg | ORAL | Status: DC | PRN

## 2024-02-09 NOTE — ED Triage Notes (Signed)
 First Nurse Note: Patient to ED via [facility] from home for hypoglycemia. Initially 60 with EMS- given oral glucose and now 101. Had UTI since January- given 2nd round of antibiotic on 2/14. Not eating or drinking much since January.   EMS VS: 90-110 HR 115/72 98% RA 98.6

## 2024-02-09 NOTE — ED Notes (Signed)
 Patient provided soda

## 2024-02-09 NOTE — ED Triage Notes (Signed)
 Daughter reports patient has been weak and having confusion over the past few weeks.

## 2024-02-09 NOTE — H&P (Signed)
 History and Physical    Terry [name] [facility] BJY:[id number] DOB: 02/14/1936 DOA: 02/09/2024  Referring MD/NP/PA:   PCP: Terry Corner, PA-C   Patient coming from:  The patient is coming from home.     Chief Complaint: AMS  HPI: Terry [name] is a 88 y.o. male with medical history significant of hypertension, hyperlipidemia, CAD, stent placement, diastolic CHF, bladder cancer, s/p of ileal conduit, bullous pemphigoid, kidney stone, CAD, who presents with altered mental status.  Patient has AMS, and is unable to provide accurate medical history, therefore, most of the history is obtained by discussing the case with ED physician, per EMS report, and with the nursing staff.   Per report, patient has been confused in the past several days.  EMS reported that the patient has hypoglycemia with blood glucose initially of 63 and increased with oral glucose to 101.  Patient is reportedly had urinary tract infection since January and has been given antibiotic treatment.  Patient has a generalized weakness, poor appetite, and decreased oral intake.  When I saw patient on the floor, patient has mild dry cough, no respiratory distress, no active nausea, vomiting, diarrhea noted.  Does not seem to have abdominal pain or chest pain.  Patient is confused, knows his own name, not orientated to the place and time.  He moves all extremities.  No facial droop or slurred speech.  Data reviewed independently and ED Course: pt was found to have positive PCR for COVID, WBC 8.0, AKI with creatinine 1.39, BUN 28, GFR 50 (recent baseline creatinine 0.96 on 01/11/2024), positive UA (cloudy appearance, large amount of leukocyte, many bacteria, WBC> 50), temperature normal, blood pressure 122/56, heart rate of 103, RR 24, oxygen saturation 100% on room air.  CT of head is negative.  Chest x-ray negative for infiltration.  Patient is admitted to telemetry bed as inpatient.     EKG: I have personally  reviewed.  Seem to be sinus rhythm with frequent PAC, QTc 432, LAD   Review of Systems: Could not be reviewed accurately due to altered mental status.  Allergy:  Allergies  Allergen Reactions   Triprolidine-Pseudoephedrine Rash and Hives   Triprolidine-Pseudoephedrine Hives and Rash   Dapsone Other (See Comments)    Methemoglobinemia   Other reaction(s): Unknown  Methemoglobinemia   Methemoglobinemia   Methemoglobinemia   Other reaction(s): Unknown  Methemoglobinemia   Sudafed Pe Cold-Cough  [Phenylephrine-Dm-Gg-Apap] Hives   Actifed Cold-Allergy  [Chlorpheniramine-Phenylephrine] Rash    Past Medical History:  Diagnosis Date   AKI (acute kidney injury) (HCC) 01/09/2024   Bullous pemphigoid    CAD (coronary artery disease)    Cancer (HCC)    bladder cancer   Hyperlipidemia    Hypertension    Ischemic cardiomyopathy    Kidney stones    Nephrolithiasis    Status post surgical removal and fulguration of bladder neoplasm     Past Surgical History:  Procedure Laterality Date   BLADDER SURGERY  06/2015   CORONARY/GRAFT ACUTE MI REVASCULARIZATION N/A 08/28/2023   Procedure: Coronary/Graft Acute MI Revascularization;  Surgeon: Yates Decamp, MD;  Location: [facility] INVASIVE CV LAB;  Service: Cardiovascular;  Laterality: N/A;   deviated nose septum surgery  1970   EP IMPLANTABLE DEVICE     HERNIA REPAIR  1980   single septum port     stomach ulcer surgery  1994   vascular stent  2006    Social History:  reports that he quit smoking about 18 years ago. His smoking use  included cigarettes. He started smoking about 68 years ago. He has a 75 pack-year smoking history. He has quit using smokeless tobacco.  His smokeless tobacco use included chew. He reports that he does not drink alcohol and does not use drugs.  Family History:  Family History  Problem Relation Age of Onset   Diabetes Mother    Heart disease Mother    Hypertension Father    Alzheimer's disease Father    Hip fracture  Father    Prostate cancer Paternal Uncle      Prior to Admission medications   Medication Sig Start Date End Date Taking? Authorizing Provider  acetaminophen (TYLENOL) 325 MG tablet Take 650 mg by mouth every 6 (six) hours as needed for mild pain.    [provider]  cetirizine (ZYRTEC) 10 MG tablet Take 10 mg by mouth daily.    [provider]  diphenhydramine-acetaminophen (TYLENOL PM) 25-500 MG TABS tablet Take 1 tablet by mouth at bedtime as needed (for sleep).    [provider]  feeding supplement (ENSURE ENLIVE / ENSURE PLUS) LIQD Take 237 mLs by mouth 2 (two) times daily between meals. 01/11/24   Pennie Banter, DO  methotrexate (RHEUMATREX) 2.5 MG tablet Take 15 mg by mouth once a week. Takes on Friday - Last dose was 1/17 11/30/23   [provider]  neomycin-bacitracin-polymyxin (NEOSPORIN) [phone] ointment Apply 1 application topically as needed.    [provider]  nitroGLYCERIN (NITROSTAT) 0.4 MG SL tablet Place 1 tablet (0.4 mg total) under the tongue every 5 (five) minutes x 3 doses as needed for chest pain. 08/29/23   Yates Decamp, MD  ondansetron (ZOFRAN) 4 MG tablet Take 1 tablet (4 mg total) by mouth daily as needed for nausea or vomiting. 08/29/23 08/28/24  Yates Decamp, MD  Ostomy Supplies [facility] DRAINABLE) Pouch MISC Ostomy supplies as needed. Diagnosis: Bladder Cancer (C67.9) 12/18/15   [provider]  rosuvastatin (CRESTOR) 20 MG tablet Take 1 tablet (20 mg total) by mouth daily. 09/21/23   Sondra Barges, PA-C  Skin Protectants, Misc. (CAVILON NO STING BARRIER FILM EX) Apply topically as needed.    [provider]  ticagrelor (BRILINTA) 90 MG TABS tablet Take 1 tablet (90 mg total) by mouth 2 (two) times daily. 12/31/23   Dunn, Raymon Mutton, PA-C  triamcinolone cream (KENALOG) 0.1 % Apply 1 application topically 2 (two) times daily.  07/18/17   [provider]  VITAMIN D PO Take by mouth daily in the afternoon.     [provider]    Physical Exam: Vitals:   02/09/24 2100 02/09/24 2130 02/09/24 2207 02/09/24 2250  BP: (!) 110/59 (!) 116/57 133/64   Pulse: 82 92 84   Resp: 17 20 20    Temp:   98.1 F (36.7 C)   TempSrc:      SpO2: 100% 97% 100%   Weight:    49.9 kg  Height:    5\' 7"  (1.702 m)   General: Not in acute distress.  Dry mucous membranes HEENT:       Eyes: PERRL, EOMI, no jaundice       ENT: No discharge from the ears and nose, no pharynx injection, no tonsillar enlargement.        Neck: No JVD, no bruit, no mass felt. Heme: No neck lymph node enlargement. Cardiac: S1/S2, RRR, No murmurs, No gallops or rubs. Respiratory: No rales, wheezing, rhonchi or rubs. GI: Soft, nondistended, nontender, no rebound pain, no  organomegaly, BS present. GU: No hematuria Ext: No pitting leg edema bilaterally. 1+DP/PT pulse bilaterally. Musculoskeletal: No joint deformities, No joint redness or warmth, no limitation of ROM in spin. Skin: No rashes.  Neuro: Confused, not orientated to place and time, knows his own name.  cranial nerves II-XII grossly intact, moves all extremities. Psych: Patient is not psychotic, no suicidal or hemocidal ideation.  Labs on Admission: I have personally reviewed following labs and imaging studies  CBC: Recent Labs  Lab 02/09/24 1515  WBC 8.0  HGB 11.7*  HCT 36.0*  MCV 97.6  PLT 92*   Basic Metabolic Panel: Recent Labs  Lab 02/09/24 1515  NA 137  K 3.0*  CL 109  CO2 19*  GLUCOSE 109*  BUN 28*  CREATININE 1.39*  CALCIUM 9.0  MG 2.2  PHOS 1.8*   GFR: Estimated Creatinine Clearance: 27.4 mL/min (A) (by C-G formula based on SCr of 1.39 mg/dL (H)). Liver Function Tests: No results for input(s): "AST", "ALT", "ALKPHOS", "BILITOT", "PROT", "ALBUMIN" in the last 168 hours. No results for input(s): "LIPASE", "AMYLASE" in the last 168 hours. No results for input(s): "AMMONIA" in the last 168 hours. Coagulation Profile: No results for input(s):  "INR", "PROTIME" in the last 168 hours. Cardiac Enzymes: No results for input(s): "CKTOTAL", "CKMB", "CKMBINDEX", "TROPONINI" in the last 168 hours. BNP (last 3 results) No results for input(s): "PROBNP" in the last 8760 hours. HbA1C: No results for input(s): "HGBA1C" in the last 72 hours. CBG: Recent Labs  Lab 02/09/24 2348  GLUCAP 105*   Lipid Profile: No results for input(s): "CHOL", "HDL", "LDLCALC", "TRIG", "CHOLHDL", "LDLDIRECT" in the last 72 hours. Thyroid Function Tests: No results for input(s): "TSH", "T4TOTAL", "FREET4", "T3FREE", "THYROIDAB" in the last 72 hours. Anemia Panel: No results for input(s): "VITAMINB12", "FOLATE", "FERRITIN", "TIBC", "IRON", "RETICCTPCT" in the last 72 hours. Urine analysis:    Component Value Date/Time   COLORURINE YELLOW (A) 02/09/2024 1931   APPEARANCEUR CLOUDY (A) 02/09/2024 1931   LABSPEC 1.011 02/09/2024 1931   PHURINE 6.0 02/09/2024 1931   GLUCOSEU NEGATIVE 02/09/2024 1931   HGBUR LARGE (A) 02/09/2024 1931   BILIRUBINUR NEGATIVE 02/09/2024 1931   BILIRUBINUR neg 12/18/2015 1420   KETONESUR 5 (A) 02/09/2024 1931   PROTEINUR 100 (A) 02/09/2024 1931   UROBILINOGEN 0.2 12/18/2015 1420   NITRITE NEGATIVE 02/09/2024 1931   LEUKOCYTESUR LARGE (A) 02/09/2024 1931   Sepsis Labs: @LABRCNTIP (procalcitonin:4,lacticidven:4) ) Recent Results (from the past 240 hours)  Resp panel by RT-PCR (RSV, Flu A&B, Covid) Anterior Nasal Swab     Status: Abnormal   Collection Time: 02/09/24  3:13 PM   Specimen: Anterior Nasal Swab  Result Value Ref Range Status   SARS Coronavirus 2 by RT PCR POSITIVE (A) NEGATIVE Final    Comment: (NOTE) SARS-CoV-2 target nucleic acids are DETECTED.  The SARS-CoV-2 RNA is generally detectable in upper respiratory specimens during the acute phase of infection. Positive results are indicative of the presence of the identified virus, but do not rule out bacterial infection or co-infection with other pathogens  not detected by the test. Clinical correlation with patient history and other diagnostic information is necessary to determine patient infection status. The expected result is Negative.  Fact Sheet for Patients: BloggerCourse.com  Fact Sheet for Healthcare Providers: SeriousBroker.it  This test is not yet approved or cleared by the [place] FDA and  has been authorized for detection and/or diagnosis of SARS-CoV-2 by FDA under an Emergency Use Authorization (EUA).  This EUA will  remain in effect (meaning this test can be used) for the duration of  the COVID-19 declaration under Section 564(b)(1) of the A ct, 21 U.S.C. section 360bbb-3(b)(1), unless the authorization is terminated or revoked sooner.     Influenza A by PCR NEGATIVE NEGATIVE Final   Influenza B by PCR NEGATIVE NEGATIVE Final    Comment: (NOTE) The Xpert Xpress SARS-CoV-2/FLU/RSV plus assay is intended as an aid in the diagnosis of influenza from Nasopharyngeal swab specimens and should not be used as a sole basis for treatment. Nasal washings and aspirates are unacceptable for Xpert Xpress SARS-CoV-2/FLU/RSV testing.  Fact Sheet for Patients: BloggerCourse.com  Fact Sheet for Healthcare Providers: SeriousBroker.it  This test is not yet approved or cleared by the [place] FDA and has been authorized for detection and/or diagnosis of SARS-CoV-2 by FDA under an Emergency Use Authorization (EUA). This EUA will remain in effect (meaning this test can be used) for the duration of the COVID-19 declaration under Section 564(b)(1) of the Act, 21 U.S.C. section 360bbb-3(b)(1), unless the authorization is terminated or revoked.     Resp Syncytial Virus by PCR NEGATIVE NEGATIVE Final    Comment: (NOTE) Fact Sheet for Patients: BloggerCourse.com  Fact Sheet for Healthcare  Providers: SeriousBroker.it  This test is not yet approved or cleared by the [place] FDA and has been authorized for detection and/or diagnosis of SARS-CoV-2 by FDA under an Emergency Use Authorization (EUA). This EUA will remain in effect (meaning this test can be used) for the duration of the COVID-19 declaration under Section 564(b)(1) of the Act, 21 U.S.C. section 360bbb-3(b)(1), unless the authorization is terminated or revoked.  Performed at [facility], [address], [place], [place] [address]      Radiological Exams on Admission:   Assessment/Plan Principal Problem:   COVID-19 virus infection Active Problems:   UTI (urinary tract infection)   Acute metabolic encephalopathy   Hypoglycemia   Hypokalemia   Hypophosphatemia   Coronary artery disease   Hypercholesteremia   Benign essential HTN   AKI (acute kidney injury) (HCC)   Thrombocytopenia (HCC)   Protein-calorie malnutrition, severe (HCC)   Abnormal EKG   Assessment and Plan:   COVID-19 virus infection: No oxygen desaturation.  Chest x-ray negative for infiltration.  No fever, clinically not septic.  -Admit to telemetry bed as inpatient -Supportive care -Bronchodilators and prn Robitussin for cough  UTI (urinary tract infection) -Rocephin -Follow-up urine culture  Acute metabolic encephalopathy: CT head negative, possibly due to UTI -Currently neurocheck -Fall precaution  Hypoglycemia and hypophosphatemia:  -CBG check every 4 hours -As needed D50 -IV fluid: D5-normal saline at 75 cc/h  Hypokalemia: Potassium 3.0, phosphorus 1.8.  Magnesium 2.2 -Repleted potassium and phosphorus  Coronary artery disease: S/p of stent placement. -Brilinta, Crestor  Hypercholesteremia -Crestor  Benign essential HTN: Patient not taking medications currently.  Blood pressure 122/56 -IV hydralazine as needed  AKI (acute kidney injury) (HCC): Likely due to  dehydration and UTI -IV fluid as above -Avoid using renal toxic medications  Thrombocytopenia (HCC): Platelet 92 -Check LDH and peripheral smear  Protein-calorie malnutrition, severe (HCC): Body weight 49.9 kg, BMI 17.23 -Ensure -Consult to nutrition  Abnormal EKG: EKG seems to be sinus rhythm with frequent PAC's,  -Repeat EKG for further clarification      DVT ppx: SCD  Code Status: Full code   Family Communication:     not done, no family member is at bed side.  Disposition Plan:  Anticipate discharge back to previous environment  Consults called:  none  Admission status and Level of care: Telemetry Medical:    for obs as inpt        Dispo: The patient is from: Home              Anticipated d/c is to: Home              Anticipated d/c date is: 2 days              Patient currently is not medically stable to d/c.    Severity of Illness:  The appropriate patient status for this patient is INPATIENT. Inpatient status is judged to be reasonable and necessary in order to provide the required intensity of service to ensure the patient's safety. The patient's presenting symptoms, physical exam findings, and initial radiographic and laboratory data in the context of their chronic comorbidities is felt to place them at high risk for further clinical deterioration. Furthermore, it is not anticipated that the patient will be medically stable for discharge from the hospital within 2 midnights of admission.   * I certify that at the point of admission it is my clinical judgment that the patient will require inpatient hospital care spanning beyond 2 midnights from the point of admission due to high intensity of service, high risk for further deterioration and high frequency of surveillance required.*       Date of Service 02/10/2024    Lorretta Harp [facility]   If 7PM-7AM, please contact night-coverage [email or web address] 02/10/2024, 12:37 AM

## 2024-02-09 NOTE — ED Provider Notes (Signed)
 [facility] Provider Note   Event Date/Time   First MD Initiated Contact with Patient 02/09/24 1800     (approximate) History  Weakness  HPI Terry [name] is a 88 y.o. male with a past medical history of CAD, hyperlipidemia, bladder wall cancer, hypertension, and bullous pemphigoid who presents via EMS after being found to be hypoglycemic at home with blood glucose initially of 63 and increased with oral glucose to 101.  Patient is reportedly had a urinary tract infection since January and has been given segment of antibiotics on 2/14 but patient has not been eating or drinking as well as he should.  Patient's daughter at bedside and provides further history stating that he has been increasingly weak and confused over the past week.  Patient is only oriented to his name at this time ROS: Unable to assess   Physical Exam  Triage Vital Signs: ED Triage Vitals  Encounter Vitals Group     BP 02/09/24 1510 127/72     Systolic BP Percentile --      Diastolic BP Percentile --      Pulse Rate 02/09/24 1510 83     Resp 02/09/24 1510 20     Temp 02/09/24 1510 98.6 F (37 C)     Temp Source 02/09/24 1510 Oral     SpO2 02/09/24 1510 98 %     Weight --      Height --      Head Circumference --      Peak Flow --      Pain Score 02/09/24 1511 0     Pain Loc --      Pain Education --      Exclude from Growth Chart --    Most recent vital signs: Vitals:   02/09/24 1900 02/09/24 1930  BP: 121/72 (!) 122/56  Pulse: (!) 103 98  Resp: (!) 24 18  Temp:    SpO2: 100% 100%   General: Awake, oriented to person only CV:  Good peripheral perfusion.  Resp:  Normal effort.  Abd:  No distention.  Other:  Elderly underweight Caucasian male resting comfortably in no acute distress ED Results / Procedures / Treatments  Labs (all labs ordered are listed, but only abnormal results are displayed) Labs Reviewed  RESP PANEL BY RT-PCR (RSV, FLU A&B, COVID)  [id number] -  Abnormal; Notable for the following components:      Result Value   SARS Coronavirus 2 by RT PCR POSITIVE (*)    All other components within normal limits  BASIC METABOLIC PANEL - Abnormal; Notable for the following components:   Potassium 3.0 (*)    CO2 19 (*)    Glucose, Bld 109 (*)    BUN 28 (*)    Creatinine, Ser 1.39 (*)    GFR, Estimated 50 (*)    All other components within normal limits  CBC - Abnormal; Notable for the following components:   RBC 3.69 (*)    Hemoglobin 11.7 (*)    HCT 36.0 (*)    RDW 18.0 (*)    Platelets 92 (*)    All other components within normal limits  URINALYSIS, ROUTINE W REFLEX MICROSCOPIC - Abnormal; Notable for the following components:   Color, Urine YELLOW (*)    APPearance CLOUDY (*)    Hgb urine dipstick LARGE (*)    Ketones, ur 5 (*)    Protein, ur 100 (*)    Leukocytes,Ua LARGE (*)  Bacteria, UA MANY (*)    All other components within normal limits  URINE CULTURE  MAGNESIUM  PHOSPHORUS  LACTATE DEHYDROGENASE  TECHNOLOGIST SMEAR REVIEW  BASIC METABOLIC PANEL  CBC   EKG ED ECG REPORT I, Merwyn Katos, the attending physician, personally viewed and interpreted this ECG. Date: 02/09/2024 EKG Time: 1515 Rate: 107 Rhythm: Atrial fibrillation QRS Axis: normal Intervals: normal ST/T Wave abnormalities: normal Narrative Interpretation: Atrial fibrillation.  No evidence of acute ischemia RADIOLOGY ED MD interpretation: CT of the head without contrast interpreted by me shows no evidence of acute abnormalities including no intracerebral hemorrhage, obvious masses, or significant edema -Agree with radiology assessment Official radiology report(s): DG Chest Port 1 View Result Date: 02/09/2024 CLINICAL DATA:  Short of breath, weakness, confusion EXAM: PORTABLE CHEST 1 VIEW COMPARISON:  01/08/2024 FINDINGS: Single frontal view of the chest demonstrates a stable right chest wall port. The cardiac silhouette is unremarkable. Stable areas  of scarring compatible with emphysema. No acute airspace disease, effusion, or pneumothorax. No acute bony abnormalities. IMPRESSION: 1. Findings consistent with background emphysema, stable. No acute airspace disease. Electronically Signed   By: Sharlet Salina M.D.   On: 02/09/2024 19:54   CT Head Wo Contrast Result Date: 02/09/2024 CLINICAL DATA:  Mental status change of unknown cause. Hypoglycemia. EXAM: CT HEAD WITHOUT CONTRAST TECHNIQUE: Contiguous axial images were obtained from the base of the skull through the vertex without intravenous contrast. RADIATION DOSE REDUCTION: This exam was performed according to the departmental dose-optimization program which includes automated exposure control, adjustment of the mA and/or kV according to patient size and/or use of iterative reconstruction technique. COMPARISON:  None Available. FINDINGS: Brain: Diffuse cerebral atrophy. Ventricular dilatation consistent with central atrophy. Low-attenuation changes in the deep white matter consistent with small vessel ischemia. No abnormal extra-axial fluid collections. No mass effect or midline shift. Gray-white matter junctions are distinct. Basal cisterns are not effaced. No acute intracranial hemorrhage. Vascular: No hyperdense vessel or unexpected calcification. Skull: Normal. Negative for fracture or focal lesion. Sinuses/Orbits: Mucosal thickening in the paranasal sinuses. No acute air-fluid levels. Mastoid air cells are clear. Other: None. IMPRESSION: No acute intracranial abnormalities. Chronic atrophy and small vessel ischemic changes. Electronically Signed   By: Burman Nieves M.D.   On: 02/09/2024 19:21   PROCEDURES: Critical Care performed: No .1-3 Lead EKG Interpretation  Performed by: Merwyn Katos, MD Authorized by: Merwyn Katos, MD     Interpretation: normal     ECG rate:  91   ECG rate assessment: normal     Rhythm: sinus rhythm     Ectopy: none     Conduction: normal    MEDICATIONS  ORDERED IN ED: Medications  cefTRIAXone (ROCEPHIN) 1 g in sodium chloride 0.9 % 100 mL IVPB (has no administration in time range)  potassium chloride SA (KLOR-CON M) CR tablet 40 mEq (has no administration in time range)  potassium chloride 10 mEq in 100 mL IVPB (has no administration in time range)  albuterol (PROVENTIL) (2.5 MG/3ML) 0.083% nebulizer solution 2.5 mg (has no administration in time range)  guaiFENesin (ROBITUSSIN) 100 MG/5ML liquid 200 mg (has no administration in time range)  dextrose 50 % solution 50 mL (has no administration in time range)  ondansetron (ZOFRAN) injection 4 mg (has no administration in time range)  hydrALAZINE (APRESOLINE) injection 5 mg (has no administration in time range)  acetaminophen (TYLENOL) tablet 650 mg (has no administration in time range)  dextrose 5 %-0.9 % sodium chloride infusion (  has no administration in time range)   IMPRESSION / MDM / ASSESSMENT AND PLAN / ED COURSE  I reviewed the triage vital signs and the nursing notes.                             The patient is on the cardiac monitor to evaluate for evidence of arrhythmia and/or significant heart rate changes. Patient's presentation is most consistent with acute presentation with potential threat to life or bodily function. Patient presents for altered mental status of unknown origin Patient has tested positive for COVID-19 Patient does have urinary tract infection Will obtain medical workup and discuss with social work to try to obtain collateral information.  Given History, Physical, and Workup there is no overt concern for a dangerous emergent cause such as, but not limited to, CNS infection, severe Toxidrome, severe metabolic derangement, or stroke.  Disposition: Admit; the patient is suffering altered mental status that is persistent and therefore they will be admitted.   FINAL CLINICAL IMPRESSION(S) / ED DIAGNOSES   Final diagnoses:  COVID-19 virus infection  Altered  mental status, unspecified altered mental status type  Urinary tract infection without hematuria, site unspecified   Rx / DC Orders   ED Discharge Orders     None      Note:  This document was prepared using Dragon voice recognition software and may include unintentional dictation errors.   Merwyn Katos, MD 02/09/24 2056

## 2024-02-10 DIAGNOSIS — U071 COVID-19: Secondary | ICD-10-CM | POA: Diagnosis not present

## 2024-02-10 LAB — IRON AND TIBC
Iron: 45 ug/dL (ref 45–182)
Saturation Ratios: 27 % (ref 17.9–39.5)
TIBC: 168 ug/dL — ABNORMAL LOW (ref 250–450)
UIBC: 123 ug/dL

## 2024-02-10 LAB — CBC
HCT: 31.4 % — ABNORMAL LOW (ref 39.0–52.0)
Hemoglobin: 10.2 g/dL — ABNORMAL LOW (ref 13.0–17.0)
MCH: 31.9 pg (ref 26.0–34.0)
MCHC: 32.5 g/dL (ref 30.0–36.0)
MCV: 98.1 fL (ref 80.0–100.0)
Platelets: 66 10*3/uL — ABNORMAL LOW (ref 150–400)
RBC: 3.2 MIL/uL — ABNORMAL LOW (ref 4.22–5.81)
RDW: 17.9 % — ABNORMAL HIGH (ref 11.5–15.5)
WBC: 3.7 10*3/uL — ABNORMAL LOW (ref 4.0–10.5)
nRBC: 0 % (ref 0.0–0.2)

## 2024-02-10 LAB — GLUCOSE, CAPILLARY
Glucose-Capillary: 112 mg/dL — ABNORMAL HIGH (ref 70–99)
Glucose-Capillary: 93 mg/dL (ref 70–99)
Glucose-Capillary: 86 mg/dL (ref 70–99)
Glucose-Capillary: 109 mg/dL — ABNORMAL HIGH (ref 70–99)

## 2024-02-10 LAB — CK: Total CK: 87 U/L (ref 49–397)

## 2024-02-10 LAB — VITAMIN D 25 HYDROXY (VIT D DEFICIENCY, FRACTURES): Vit D, 25-Hydroxy: 42.16 ng/mL (ref 30–100)

## 2024-02-10 LAB — BASIC METABOLIC PANEL
Anion gap: 4 — ABNORMAL LOW (ref 5–15)
BUN: 24 mg/dL — ABNORMAL HIGH (ref 8–23)
CO2: 19 mmol/L — ABNORMAL LOW (ref 22–32)
Calcium: 8.5 mg/dL — ABNORMAL LOW (ref 8.9–10.3)
Chloride: 115 mmol/L — ABNORMAL HIGH (ref 98–111)
Creatinine, Ser: 1.16 mg/dL (ref 0.61–1.24)
GFR, Estimated: >60 mL/min (ref >60)
Glucose, Bld: 108 mg/dL — ABNORMAL HIGH (ref 70–99)
Potassium: 3.8 mmol/L (ref 3.5–5.1)
Sodium: 138 mmol/L (ref 135–145)

## 2024-02-10 LAB — PHOSPHORUS: Phosphorus: 2.4 mg/dL — ABNORMAL LOW (ref 2.5–4.6)

## 2024-02-10 LAB — FOLATE: Folate: 7.5 ng/mL (ref >5.9)

## 2024-02-10 MED ORDER — DIPHENHYDRAMINE-APAP (SLEEP) 25-500 MG PO TABS: 1.0000 | ORAL_TABLET | Freq: Every evening | ORAL | Status: DC | PRN

## 2024-02-10 MED ORDER — TICAGRELOR 90 MG PO TABS
90.0000 mg | ORAL_TABLET | Freq: Two times a day (BID) | ORAL | Status: DC
  Administered 2024-02-10 – 2024-02-12 (×7): 90 mg via ORAL
  Filled 2024-02-10 (×8): qty 1

## 2024-02-10 MED ORDER — K PHOS MONO-SOD PHOS DI & MONO 155-852-130 MG PO TABS
500.0000 mg | ORAL_TABLET | Freq: Three times a day (TID) | ORAL | Status: AC
  Administered 2024-02-10 (×2): 500 mg via ORAL
  Filled 2024-02-10 (×2): qty 2

## 2024-02-10 MED ORDER — NITROGLYCERIN 0.4 MG SL SUBL
0.4000 mg | SUBLINGUAL_TABLET | SUBLINGUAL | Status: DC | PRN
Start: 2024-02-10 — End: 2024-02-19

## 2024-02-10 MED ORDER — LORATADINE 10 MG PO TABS
10.0000 mg | ORAL_TABLET | Freq: Every day | ORAL | Status: DC
  Administered 2024-02-10 – 2024-02-17 (×6): 10 mg via ORAL
  Filled 2024-02-10 (×7): qty 1

## 2024-02-10 MED ORDER — METHOTREXATE SODIUM 2.5 MG PO TABS
15.0000 mg | ORAL_TABLET | ORAL | Status: DC
  Filled 2024-02-10: qty 6

## 2024-02-10 MED ORDER — POTASSIUM PHOSPHATES 15 MMOLE/5ML IV SOLN
15.0000 mmol | Freq: Once | INTRAVENOUS | Status: AC
  Administered 2024-02-10: 15 mmol via INTRAVENOUS
  Filled 2024-02-10: qty 5

## 2024-02-10 MED ORDER — VITAMIN D 25 MCG (1000 UNIT) PO TABS
1000.0000 [IU] | ORAL_TABLET | Freq: Every day | ORAL | Status: DC
  Administered 2024-02-10 – 2024-02-17 (×6): 1000 [IU] via ORAL
  Filled 2024-02-10 (×9): qty 1

## 2024-02-10 MED ORDER — ADULT MULTIVITAMIN W/MINERALS CH
1.0000 | ORAL_TABLET | Freq: Every day | ORAL | Status: DC
  Administered 2024-02-10 – 2024-02-13 (×4): 1 via ORAL
  Filled 2024-02-10 (×5): qty 1

## 2024-02-10 MED ORDER — ACETAMINOPHEN 500 MG PO TABS: 500.0000 mg | ORAL_TABLET | Freq: Every evening | ORAL | Status: DC | PRN

## 2024-02-10 MED ORDER — ROSUVASTATIN CALCIUM 10 MG PO TABS
20.0000 mg | ORAL_TABLET | Freq: Every day | ORAL | Status: DC
  Administered 2024-02-10 – 2024-02-17 (×6): 20 mg via ORAL
  Filled 2024-02-10 (×7): qty 2

## 2024-02-10 MED ORDER — SODIUM CHLORIDE 0.9 % IV SOLN: INTRAVENOUS | Status: AC

## 2024-02-10 MED ORDER — ENSURE ENLIVE PO LIQD: 237.0000 mL | Freq: Two times a day (BID) | ORAL | Status: DC

## 2024-02-10 MED ORDER — ENSURE ENLIVE PO LIQD
237.0000 mL | Freq: Three times a day (TID) | ORAL | Status: DC
  Administered 2024-02-10 – 2024-02-17 (×15): 237 mL via ORAL

## 2024-02-10 MED ORDER — SODIUM BICARBONATE 650 MG PO TABS
650.0000 mg | ORAL_TABLET | Freq: Three times a day (TID) | ORAL | Status: AC
  Administered 2024-02-10 (×2): 650 mg via ORAL
  Filled 2024-02-10 (×2): qty 1

## 2024-02-10 MED ORDER — FOLIC ACID 1 MG PO TABS
1.0000 mg | ORAL_TABLET | Freq: Every day | ORAL | Status: DC
  Administered 2024-02-10 – 2024-02-17 (×6): 1 mg via ORAL
  Filled 2024-02-10 (×9): qty 1

## 2024-02-10 MED ORDER — DIPHENHYDRAMINE HCL 25 MG PO CAPS: 25.0000 mg | ORAL_CAPSULE | Freq: Every evening | ORAL | Status: DC | PRN

## 2024-02-10 NOTE — [place] FL2 (Signed)
 [place] MEDICAID FL2 LEVEL OF CARE FORM     IDENTIFICATION  Patient Name: Terry [name] [facility] Birthdate: 1936-11-05 Sex: male Admission Date (Current Location): 02/09/2024  [facility] and [place] Number:  Chiropodist and Address:  [facility], [address], [place], [place] [address]      Provider Number: [id number]  Attending Physician Name and Address:  Gillis Santa, MD  Relative Name and Phone Number:  Loistine Chance  Daughter  Emergency Contact  [phone]  [address]  [place] [address]    Current Level of Care: Hospital Recommended Level of Care: Skilled Nursing Facility Prior Approval Number:    Date Approved/Denied:   PASRR Number: [id number]  Discharge Plan: SNF    Current Diagnoses: Patient Active Problem List   Diagnosis Date Noted   Hypophosphatemia 02/10/2024   COVID-19 virus infection 02/09/2024   Hypoglycemia 02/09/2024   Hypokalemia 02/09/2024   Acute metabolic encephalopathy 02/09/2024   UTI (urinary tract infection) 02/09/2024   Thrombocytopenia (HCC) 02/09/2024   Protein-calorie malnutrition, severe (HCC) 02/09/2024   Abnormal EKG 02/09/2024   AKI (acute kidney injury) (HCC) 01/09/2024   Elevated transaminase level 01/09/2024   Dyslipidemia 01/09/2024   Coronary artery disease 01/09/2024   Acute lower UTI 01/08/2024   STEMI (ST elevation myocardial infarction) (HCC) 08/28/2023   Acute ST elevation myocardial infarction (STEMI) due to occlusion of mid portion of left anterior descending (LAD) coronary artery (HCC) 08/28/2023   S/P ileal conduit (HCC) 08/07/2015   History of surgical procedure 08/07/2015   Bladder cancer (HCC) 06/21/2015   Cancer of lateral wall of urinary bladder (HCC) 04/26/2015   Malignant neoplasm of lateral wall of urinary bladder (HCC) 04/26/2015   Coronary artery disease involving native coronary artery of native heart with unstable angina pectoris (HCC) 04/24/2015   Blood pressure  elevated 04/24/2015   Hypercholesteremia 04/24/2015   Calculus of kidney 04/24/2015   Psoriasis 04/24/2015   Gastric ulcer 04/24/2015   Pain in soft tissues of limb 04/24/2015   Abnormal LFTs 04/24/2015   Benign essential HTN 03/27/2015   Combined fat and carbohydrate induced hyperlipemia 03/27/2015   Breathlessness on exertion 03/27/2015    Orientation RESPIRATION BLADDER Height & Weight     Self  Normal Continent Weight: 49.9 kg Height:  5\' 7"  (170.2 cm)  BEHAVIORAL SYMPTOMS/MOOD NEUROLOGICAL BOWEL NUTRITION STATUS      Incontinent Diet (see dc summary)  AMBULATORY STATUS COMMUNICATION OF NEEDS Skin   Limited Assist Verbally Normal                       Personal Care Assistance Level of Assistance  Bathing, Feeding, Dressing Bathing Assistance: Limited assistance Feeding assistance: Independent Dressing Assistance: Limited assistance     Functional Limitations Info  Sight, Hearing, Speech Sight Info: Adequate Hearing Info: Impaired Speech Info: Adequate    SPECIAL CARE FACTORS FREQUENCY  PT (By licensed PT), OT (By licensed OT)     PT Frequency: 5 times per week OT Frequency: 5 times per week            Contractures Contractures Info: Not present    Additional Factors Info  Code Status, Allergies Code Status Info: full code Allergies Info: Triprolidine-pseudoephedrine, Triprolidine-pseudoephedrine, Dapsone, Sudafed Pe Cold-cough  (Phenylephrine-dm-gg-apap), Actifed Cold-allergy  (Chlorpheniramine-phenylephrine)           Current Medications (02/10/2024):  This is the current hospital active medication list Current Facility-Administered Medications  Medication Dose Route Frequency Provider  Last Rate Last Admin   diphenhydrAMINE (BENADRYL) capsule 25 mg  25 mg Oral QHS PRN Lorretta Harp, MD       And   acetaminophen (TYLENOL) tablet 500 mg  500 mg Oral QHS PRN Lorretta Harp, MD       acetaminophen (TYLENOL) tablet 650 mg  650 mg Oral Q6H PRN Lorretta Harp,  MD       albuterol (PROVENTIL) (2.5 MG/3ML) 0.083% nebulizer solution 2.5 mg  2.5 mg Nebulization Q4H PRN Lorretta Harp, MD       cefTRIAXone (ROCEPHIN) 1 g in sodium chloride 0.9 % 100 mL IVPB  1 g Intravenous Q24H Lorretta Harp, MD   Stopped at 02/09/24 2250   cholecalciferol (VITAMIN D3) 25 MCG (1000 UNIT) tablet 1,000 Units  1,000 Units Oral Daily Lorretta Harp, MD   1,000 Units at 02/10/24 0944   dextrose 5 %-0.9 % sodium chloride infusion   Intravenous Continuous Lorretta Harp, MD 75 mL/hr at 02/10/24 0610 Infusion Verify at 02/10/24 0610   dextrose 50 % solution 50 mL  50 mL Intravenous PRN Lorretta Harp, MD       feeding supplement (ENSURE ENLIVE / ENSURE PLUS) liquid 237 mL  237 mL Oral TID BM Gillis Santa, MD   237 mL at 02/10/24 0944   folic acid (FOLVITE) tablet 1 mg  1 mg Oral Daily Lorretta Harp, MD   1 mg at 02/10/24 0944   guaiFENesin (ROBITUSSIN) 100 MG/5ML liquid 200 mg  200 mg Oral Q4H PRN Lorretta Harp, MD       hydrALAZINE (APRESOLINE) injection 5 mg  5 mg Intravenous Q2H PRN Lorretta Harp, MD       loratadine (CLARITIN) tablet 10 mg  10 mg Oral Daily Lorretta Harp, MD   10 mg at 02/10/24 0944   [START ON 02/12/2024] methotrexate (RHEUMATREX) tablet 15 mg  15 mg Oral Weekly Lorretta Harp, MD       multivitamin with minerals tablet 1 tablet  1 tablet Oral Daily Gillis Santa, MD   1 tablet at 02/10/24 0944   nitroGLYCERIN (NITROSTAT) SL tablet 0.4 mg  0.4 mg Sublingual Q5 Min x 3 PRN Lorretta Harp, MD       ondansetron [facility]) injection 4 mg  4 mg Intravenous Q8H PRN Lorretta Harp, MD       phosphorus (K PHOS NEUTRAL) tablet 500 mg  500 mg Oral TID Gillis Santa, MD   500 mg at 02/10/24 0944   rosuvastatin (CRESTOR) tablet 20 mg  20 mg Oral Daily Lorretta Harp, MD   20 mg at 02/10/24 4540   sodium bicarbonate tablet 650 mg  650 mg Oral TID Gillis Santa, MD   650 mg at 02/10/24 0944   ticagrelor (BRILINTA) tablet 90 mg  90 mg Oral BID Lorretta Harp, MD   90 mg at 02/10/24 9811     Discharge Medications: Please see  discharge summary for a list of discharge medications.  Relevant Imaging Results:  Relevant Lab Results:   Additional Information SS# [id number]  Marlowe Sax, RN

## 2024-02-10 NOTE — Plan of Care (Signed)
  Problem: Coping: Goal: Psychosocial and spiritual needs will be supported Outcome: Progressing   Problem: Respiratory: Goal: Will maintain a patent airway Outcome: Progressing Goal: Complications related to the disease process, condition or treatment will be avoided or minimized Outcome: Progressing   Problem: Education: Goal: Knowledge of General Education information will improve Description: Including pain rating scale, medication(s)/side effects and non-pharmacologic comfort measures Outcome: Progressing   Problem: Health Behavior/Discharge Planning: Goal: Ability to manage health-related needs will improve Outcome: Progressing   Problem: Clinical Measurements: Goal: Ability to maintain clinical measurements within normal limits will improve Outcome: Progressing Goal: Will remain free from infection Outcome: Progressing Goal: Diagnostic test results will improve Outcome: Progressing Goal: Respiratory complications will improve Outcome: Progressing Goal: Cardiovascular complication will be avoided Outcome: Progressing   Problem: Activity: Goal: Risk for activity intolerance will decrease Outcome: Progressing   Problem: Nutrition: Goal: Adequate nutrition will be maintained Outcome: Progressing   Problem: Coping: Goal: Level of anxiety will decrease Outcome: Progressing   Problem: Elimination: Goal: Will not experience complications related to bowel motility Outcome: Progressing Goal: Will not experience complications related to urinary retention Outcome: Progressing   Problem: Pain Managment: Goal: General experience of comfort will improve and/or be controlled Outcome: Progressing   Problem: Safety: Goal: Ability to remain free from injury will improve Outcome: Progressing   Problem: Skin Integrity: Goal: Risk for impaired skin integrity will decrease Outcome: Progressing

## 2024-02-10 NOTE — TOC Initial Note (Signed)
 Transition of Care [facility]) - Initial/Assessment Note    Patient Details  Name: Terry [name] MRN: [id number] Date of Birth: 1936-01-05  [facility] CM/SW Contact:    Marlowe Sax, RN Phone Number: 02/10/2024, 2:06 PM  Clinical Narrative:                  Spoke with Pam his daughter I explained how STR bedsearch works and we talked about if he needed additional long term services what insurance does and does not cover, I encouraged her to go ahead and apply for Medicaid in case he needs long term ALF or SNF She stated understanding, Bedsearch was completed, PASSR obtained, FL2 completed, will review bed offers once obtained       Patient Goals and CMS Choice            Expected Discharge Plan and Services                                              Prior Living Arrangements/Services                       Activities of Daily Living   ADL Screening (condition at time of admission) Independently performs ADLs?: No Does the patient have a NEW difficulty with bathing/dressing/toileting/self-feeding that is expected to last >3 days?: No Does the patient have a NEW difficulty with getting in/out of bed, walking, or climbing stairs that is expected to last >3 days?: No Does the patient have a NEW difficulty with communication that is expected to last >3 days?: No Is the patient deaf or have difficulty hearing?: No Does the patient have difficulty seeing, even when wearing glasses/contacts?: No Does the patient have difficulty concentrating, remembering, or making decisions?: Yes  Permission Sought/Granted                  Emotional Assessment              Admission diagnosis:  Urinary tract infection without hematuria, site unspecified [N39.0] Altered mental status, unspecified altered mental status type [R41.82] COVID-19 virus infection [U07.1] Patient Active Problem List   Diagnosis Date Noted   Hypophosphatemia  02/10/2024   COVID-19 virus infection 02/09/2024   Hypoglycemia 02/09/2024   Hypokalemia 02/09/2024   Acute metabolic encephalopathy 02/09/2024   UTI (urinary tract infection) 02/09/2024   Thrombocytopenia (HCC) 02/09/2024   Protein-calorie malnutrition, severe (HCC) 02/09/2024   Abnormal EKG 02/09/2024   AKI (acute kidney injury) (HCC) 01/09/2024   Elevated transaminase level 01/09/2024   Dyslipidemia 01/09/2024   Coronary artery disease 01/09/2024   Acute lower UTI 01/08/2024   STEMI (ST elevation myocardial infarction) (HCC) 08/28/2023   Acute ST elevation myocardial infarction (STEMI) due to occlusion of mid portion of left anterior descending (LAD) coronary artery (HCC) 08/28/2023   S/P ileal conduit (HCC) 08/07/2015   History of surgical procedure 08/07/2015   Bladder cancer (HCC) 06/21/2015   Cancer of lateral wall of urinary bladder (HCC) 04/26/2015   Malignant neoplasm of lateral wall of urinary bladder (HCC) 04/26/2015   Coronary artery disease involving native coronary artery of native heart with unstable angina pectoris (HCC) 04/24/2015   Blood pressure elevated 04/24/2015   Hypercholesteremia 04/24/2015   Calculus of kidney 04/24/2015   Psoriasis 04/24/2015   Gastric ulcer 04/24/2015   Pain in soft tissues  of limb 04/24/2015   Abnormal LFTs 04/24/2015   Benign essential HTN 03/27/2015   Combined fat and carbohydrate induced hyperlipemia 03/27/2015   Breathlessness on exertion 03/27/2015   PCP:  Wilford Corner, PA-C Pharmacy:   [facility] [address], [place] - [address] [place] [address] Phone: [phone] Fax: [phone]     Social Drivers of Health (SDOH) Social History: SDOH Screenings   Food Insecurity: No Food Insecurity (02/09/2024)  Housing: Low Risk  (02/09/2024)  Transportation Needs: No Transportation Needs (02/09/2024)  Utilities: Not At Risk (02/09/2024)  Alcohol Screen: Low Risk  (08/26/2019)  Depression  (PHQ2-9): Low Risk  (02/01/2020)  Financial Resource Strain: Low Risk  (08/27/2023)   Received from [name]  Physical Activity: Inactive (02/01/2020)  Social Connections: Socially Isolated (02/09/2024)  Stress: No Stress Concern Present (02/01/2020)  Tobacco Use: Medium Risk (02/09/2024)   SDOH Interventions:     Readmission Risk Interventions     No data to display

## 2024-02-10 NOTE — Evaluation (Signed)
 Occupational Therapy Evaluation Patient Details Name: Terry [name] [facility] MRN: [id number] DOB: 1936-01-19 Today's Date: 02/10/2024   History of Present Illness   Pt  is an 88 y.o. male who presents with altered mental status. Admitted for Covid 19, UTI, and acute metabolic encephalopathy. PMH of hypertension, hyperlipidemia, CAD, stent placement, diastolic CHF, bladder cancer, s/p of ileal conduit, bullous pemphigoid, kidney stone, CAD.     Clinical Impressions Pt was seen for OT evaluation this date. Prior to hospital admission, pt was living at home with his daughter and IND with ADLs and mobility. Daughter assisted with household chores as needed, but pt also able to make his own coffee,etc.  Pt presents to acute OT demonstrating impaired ADL performance and functional mobility 2/2 weakness, fatigue, confusion and low activity tolerance. Pt currently requires Mod I to Min A for bed mobility as he fatigued. STS from EOB to RW x2 trials with CGA. He was able to stand at [facility] with unilateral support to perform posterior hygiene with no LOB. He ambulated ~6-8 feet in the room before returning to bed. Pt required increased time for all tasks, rest breaks between activities d/t DOE although sp02 on RA 97% and higher-hard to get a good pleth. HR up to 121. Pt oriented to person and location as hospital. Stated it was January 1965. Remains confused and weak compared to his baseline. Pt would benefit from skilled OT services to address noted impairments and functional limitations (see below for any additional details) in order to maximize safety and independence while minimizing falls risk and caregiver burden. Do anticipate the need for follow up OT services upon acute hospital DC.      If plan is discharge home, recommend the following:   A little help with walking and/or transfers;A lot of help with bathing/dressing/bathroom;Assistance with cooking/housework;Assist for transportation;Help with  stairs or ramp for entrance     Functional Status Assessment   Patient has had a recent decline in their functional status and demonstrates the ability to make significant improvements in function in a reasonable and predictable amount of time.     Equipment Recommendations   Other (comment);BSC/3in1 (RW)     Recommendations for Other Services         Precautions/Restrictions   Precautions Precautions: Fall Restrictions Weight Bearing Restrictions Per Provider Order: No     Mobility Bed Mobility Overal bed mobility: Needs Assistance Bed Mobility: Supine to Sit, Sit to Supine     Supine to sit: Used rails, HOB elevated, Modified independent (Device/Increase time) Sit to supine: Contact guard assist, Min assist, Used rails   General bed mobility comments: Min/CGA d/t fatigue and weakness after mobility and standing time    Transfers Overall transfer level: Needs assistance Equipment used: Rolling walker (2 wheels) Transfers: Sit to/from Stand Sit to Stand: Contact guard assist           General transfer comment: CGA for STS from EOB x2 during session and able to stand to perform posterior hygiene x2-3 mins and ambulate ~6-8 feet in room      Balance Overall balance assessment: Needs assistance   Sitting balance-Leahy Scale: Normal       Standing balance-Leahy Scale: Good Standing balance comment: RW use, but no LOB in standing for posterior hygiene or during mobility                           ADL either performed or assessed with clinical judgement  ADL Overall ADL's : Needs assistance/impaired                     Lower Body Dressing: Moderate assistance Lower Body Dressing Details (indicate cue type and reason): d/t fatigue and weakness he needed increased time to don socks and ultimately Mod A Toilet Transfer: Contact guard assist Toilet Transfer Details (indicate cue type and reason): simulated to bed after  mobility Toileting- Clothing Manipulation and Hygiene: Contact guard assist;Supervision/safety Toileting - Clothing Manipulation Details (indicate cue type and reason): CGA/SBA standing at EOB with unilateral support on RW at times     Functional mobility during ADLs: Contact guard assist;Rolling walker (2 wheels)       Vision         Perception         Praxis         Pertinent Vitals/Pain       Extremity/Trunk Assessment Upper Extremity Assessment Upper Extremity Assessment: Generalized weakness   Lower Extremity Assessment Lower Extremity Assessment: Generalized weakness       Communication Communication Factors Affecting Communication: Hearing impaired   Cognition Arousal: Alert Behavior During Therapy: WFL for tasks assessed/performed Cognition: Cognition impaired   Orientation impairments: Time, Situation         OT - Cognition Comments: oriented to self and hospital, but states it is January 1965                 Following commands: Impaired Following commands impaired: Only follows one step commands consistently, Follows one step commands with increased time     Cueing  General Comments   Cueing Techniques: Verbal cues      Exercises Other Exercises Other Exercises: Edu on role of OT in acute setting and option for STR since he is left home alone at times-pt states dtr may not like that   Shoulder Instructions      Home Living Family/patient expects to be discharged to:: Private residence Living Arrangements: Children Available Help at Discharge: Available PRN/intermittently   Home Access: Stairs to enter Entrance Stairs-Number of Steps: 4 Entrance Stairs-Rails: Can reach both                 Home Equipment: Cane - single point          Prior Functioning/Environment Prior Level of Function : Independent/Modified Independent;History of Falls (last six months)             Mobility Comments: Until recently pt has  been able to manage all in home mobility w/o AD or much assist from daughter. ADLs Comments: Daughter does all OOH errands but pt is able to manage in home ADLs    OT Problem List: Decreased strength;Decreased activity tolerance;Decreased cognition   OT Treatment/Interventions: Self-care/ADL training;Therapeutic activities;Therapeutic exercise;DME and/or AE instruction;Energy conservation;Patient/family education      OT Goals(Current goals can be found in the care plan section)   Acute Rehab OT Goals Patient Stated Goal: improve strength OT Goal Formulation: With patient Time For Goal Achievement: 02/24/24 Potential to Achieve Goals: Good ADL Goals Pt Will Perform Lower Body Bathing: with supervision;sit to/from stand;sitting/lateral leans Pt Will Perform Lower Body Dressing: with supervision;sitting/lateral leans;sit to/from stand Pt Will Transfer to Toilet: with modified independence;ambulating;regular height toilet Pt Will Perform Toileting - Clothing Manipulation and hygiene: with modified independence;with supervision;sitting/lateral leans;sit to/from stand Additional ADL Goal #1: Pt will demo implementation of 1 learned ECS during ADL performance to prevent overexertion on 2/2 trials.   OT  Frequency:  Min 1X/week    Co-evaluation              AM-PAC OT "6 Clicks" Daily Activity     Outcome Measure Help from another person eating meals?: None Help from another person taking care of personal grooming?: A Little Help from another person toileting, which includes using toliet, bedpan, or urinal?: A Little Help from another person bathing (including washing, rinsing, drying)?: A Little Help from another person to put on and taking off regular upper body clothing?: A Little Help from another person to put on and taking off regular lower body clothing?: A Lot 6 Click Score: 18   End of Session Equipment Utilized During Treatment: Rolling walker (2 wheels) Nurse  Communication: Mobility status  Activity Tolerance: Patient tolerated treatment well Patient left: in bed;with call bell/phone within reach;with bed alarm set;with nursing/sitter in room  OT Visit Diagnosis: Other abnormalities of gait and mobility (R26.89);Muscle weakness (generalized) (M62.81)                Time: 1610-9604 OT Time Calculation (min): 44 min Charges:  OT General Charges $OT Visit: 1 Visit OT Evaluation $OT Eval Moderate Complexity: 1 Mod OT Treatments $Self Care/Home Management : 8-22 mins $Therapeutic Activity: 8-22 mins Terry [name], OTR/L 02/10/24, 12:25 PM  Terry [name] Terry [name] Terry [name] 02/10/2024, 12:19 PM

## 2024-02-10 NOTE — Evaluation (Signed)
 Physical Therapy Evaluation Patient Details Name: Terry [name] MRN: [id number] DOB: 1936/04/07 Today's Date: 02/10/2024  History of Present Illness  Pt  is an 88 y.o. male who presents with altered mental status. Admitted for Covid 19, UTI, and acute metabolic encephalopathy. PMH of hypertension, hyperlipidemia, CAD, stent placement, diastolic CHF, bladder cancer, s/p of ileal conduit, bullous pemphigoid, kidney stone, CAD.  Clinical Impression  Pt was pleasant and willing to work with PT but showed very little insight and needed excessive, repeated but very simple cuing t/o the session.  He did not show good awareness (reports he's at a friend's house, did not know date or situation, etc) but generally did well with mobility.  However he needed repeated cuing and reinforcement to perform even basic transitions, etc 2/2 confusion and fidgeting.  Pt's HR was >100 t/o the ambulation effort and spiked to almost 130, SpO2 99-100% on room air with consistent checking.          If plan is discharge home, recommend the following: A little help with walking and/or transfers;A little help with bathing/dressing/bathroom;Assistance with cooking/housework;Direct supervision/assist for medications management;Assist for transportation;Help with stairs or ramp for entrance;Supervision due to cognitive status   Can travel by private vehicle        Equipment Recommendations Rolling walker (2 wheels)  Recommendations for Other Services       Functional Status Assessment Patient has had a recent decline in their functional status and demonstrates the ability to make significant improvements in function in a reasonable and predictable amount of time.     Precautions / Restrictions Precautions Precautions: Fall Restrictions Weight Bearing Restrictions Per Provider Order: No      Mobility  Bed Mobility Overal bed mobility: Needs Assistance Bed Mobility: Supine to Sit, Sit to Supine      Supine to sit: Used rails, HOB elevated, Modified independent (Device/Increase time) Sit to supine: Contact guard assist, Min assist, Used rails   General bed mobility comments: Min/CGA d/t fatigue and weakness after mobility and standing time    Transfers Overall transfer level: Needs assistance Equipment used: Rolling walker (2 wheels) Transfers: Sit to/from Stand Sit to Stand: Contact guard assist           General transfer comment: CGA for STS from EOB.  Initially had a hard time organizing hand placement, set up and initiation of standing, but after extensive but simple cuing managed to rise with only CGA    Ambulation/Gait Ambulation/Gait assistance: Modified independent (Device/Increase time) Gait Distance (Feet): 40 Feet Assistive device: Rolling walker (2 wheels)         General Gait Details: Pt with very slow cadence, maintains UEs on walker, minimal unsteadiness but very poor awareness and needing excessive guidance and reinforcement for general directional and safety cuing  Stairs            Wheelchair Mobility     Tilt Bed    Modified Rankin (Stroke Patients Only)       Balance Overall balance assessment: Needs assistance   Sitting balance-Leahy Scale: Normal       Standing balance-Leahy Scale: Fair Standing balance comment: RW use, needing consistent cuing for safety/appropriate use                             Pertinent Vitals/Pain Pain Assessment Pain Assessment: No/denies pain    Home Living Family/patient expects to be discharged to:: Private residence Living Arrangements: Children Available Help  at Discharge: Available PRN/intermittently   Home Access: Stairs to enter Entrance Stairs-Rails: Can reach both Entrance Stairs-Number of Steps: 4     Home Equipment: Rolling Walker (2 wheels);Cane - single point      Prior Function Prior Level of Function : Independent/Modified Independent;History of Falls (last six  months)             Mobility Comments: Until recently pt has been able to manage all in home mobility w/o AD or much assist from daughter. ADLs Comments: Daughter does all OOH errands, typically pt is able to manage in home ADLs with declining independence with this recently     Extremity/Trunk Assessment   Upper Extremity Assessment Upper Extremity Assessment: Generalized weakness    Lower Extremity Assessment Lower Extremity Assessment: Generalized weakness       Communication   Communication Communication: No apparent difficulties Factors Affecting Communication: Hearing impaired    Cognition Arousal: Alert Behavior During Therapy: Impulsive   PT - Cognitive impairments: History of cognitive impairments                       PT - Cognition Comments: per daughter he has been showing less and less safety and general awareness recently, pt fidgiting and needing explicit, repeated, simple cueing for even basic tasks Following commands: Impaired Following commands impaired: Only follows one step commands consistently, Follows one step commands with increased time     Cueing Cueing Techniques: Verbal cues     General Comments General comments (skin integrity, edema, etc.): O2 on room air at/near 100%, HR generally in the 100-110 range with a few brief spikes up to 120-130 range    Exercises     Assessment/Plan    PT Assessment Patient needs continued PT services  PT Problem List Decreased activity tolerance;Decreased strength;Decreased balance;Decreased cognition;Decreased safety awareness;Decreased knowledge of use of DME       PT Treatment Interventions DME instruction;Gait training;Stair training;Functional mobility training;Therapeutic activities;Therapeutic exercise;Balance training;Cognitive remediation;Patient/family education    PT Goals (Current goals can be found in the Care Plan section)  Acute Rehab PT Goals Patient Stated Goal: pt unable,  daughter wanting him to go to rehab PT Goal Formulation: With patient/family Time For Goal Achievement: 02/23/24 Potential to Achieve Goals: Fair    Frequency Min 1X/week     Co-evaluation               AM-PAC PT "6 Clicks" Mobility  Outcome Measure Help needed turning from your back to your side while in a flat bed without using bedrails?: A Little Help needed moving from lying on your back to sitting on the side of a flat bed without using bedrails?: A Little Help needed moving to and from a bed to a chair (including a wheelchair)?: A Little Help needed standing up from a chair using your arms (e.g., wheelchair or bedside chair)?: A Little Help needed to walk in hospital room?: A Little Help needed climbing 3-5 steps with a railing? : A Little 6 Click Score: 18    End of Session Equipment Utilized During Treatment: Gait belt Activity Tolerance: Patient tolerated treatment well Patient left: with chair alarm set;with call bell/phone within reach Nurse Communication: Mobility status PT Visit Diagnosis: Unsteadiness on feet (R26.81);Muscle weakness (generalized) (M62.81);Difficulty in walking, not elsewhere classified (R26.2)    Time: 9811-9147 PT Time Calculation (min) (ACUTE ONLY): 32 min   Charges:   PT Evaluation $PT Eval Low Complexity: 1 Low PT Treatments $Therapeutic  Activity: 8-22 mins PT General Charges $$ ACUTE PT VISIT: 1 Visit         Malachi Pro, DPT 02/10/2024, 1:07 PM

## 2024-02-10 NOTE — Progress Notes (Signed)
 Nutrition Follow-up  DOCUMENTATION CODES:   Severe malnutrition in context of chronic illness, Underweight  INTERVENTION:   -Liberalize diet to regular for widest variety of meal selections -MVI with minerals daily -Ensure Enlive po TID, each supplement provides 350 kcal and 20 grams of protein   NUTRITION DIAGNOSIS:   Severe Malnutrition related to chronic illness (CHF) as evidenced by severe fat depletion, severe muscle depletion.  GOAL:   Patient will meet greater than or equal to 90% of their needs  MONITOR:   PO intake, Supplement acceptance  REASON FOR ASSESSMENT:   Consult Assessment of nutrition requirement/status  ASSESSMENT:   Pt with medical history significant of hypertension, hyperlipidemia, CAD, stent placement, diastolic CHF, bladder cancer, s/p of ileal conduit, bullous pemphigoid, kidney stone, CAD, who presents with altered mental status.  Pt admitted with COVID-19 and UTI.   Reviewed I/O's: +551 ml x 24 hours  UOP: 550 ml x 24 hours  Spoke with pt and daughter at bedside. Pt with altered mental status and deferred most of the history to his daughter. Daughter reports that pt has experienced a general decline in health over the past 3 weeks, after previous hospitalization for UTI. Per daughter, pt is independent at baseline, but is currently confused and refusing to eat. In order to consume bites and sips, pt requires a lot of encouragement. Pt is missing multiple teeth, however, does not have any difficulty chewing or swallowing. Pt consumed some crackers earlier and daughter reports pt will "eat everything other than than peanuts" as they cause him pain when he eats them.   Per daughter, pt has lost a significant amount of weight, but unsure how much. Pt has experienced a 3.1% wt loss over the past 3 months, which is not significant for time frame.    Discussed importance of good meal and supplement intake to promote healing. Pt amenable to supplements  and diet advancement.   Medications reviewed and include folic acid, phosphorus, dextrose 5%-0.9% sodium chloride infusion @ 75 ml/hr, and sodium bicarbonate.   Lab Results  Component Value Date   HGBA1C 5.2 08/28/2023   PTA DM medications are none.   Labs reviewed: Phos: 2.4, CBGS: 93-112 (inpatient orders for glycemic control are none).    NUTRITION - FOCUSED PHYSICAL EXAM:  Flowsheet Row Most Recent Value  Orbital Region Severe depletion  Upper Arm Region Severe depletion  Thoracic and Lumbar Region Severe depletion  Buccal Region Severe depletion  Temple Region Severe depletion  Clavicle Bone Region Severe depletion  Clavicle and Acromion Bone Region Severe depletion  Scapular Bone Region Severe depletion  Dorsal Hand Severe depletion  Patellar Region Severe depletion  Anterior Thigh Region Severe depletion  Posterior Calf Region Severe depletion  Edema (RD Assessment) None  Hair Reviewed  Eyes Reviewed  Mouth Reviewed  Skin Reviewed  Nails Reviewed       Diet Order:   Diet Order             Diet regular Fluid consistency: Thin  Diet effective now                   EDUCATION NEEDS:   Education needs have been addressed  Skin:  Skin Assessment: Skin Integrity Issues: Skin Integrity Issues:: Other (Comment) Other: pressure injury to sacrum, open wound to lt lateral foot  Last BM:  02/09/24  Height:   Ht Readings from Last 1 Encounters:  02/09/24 5\' 7"  (1.702 m)    Weight:   Wt Readings  from Last 1 Encounters:  02/09/24 49.9 kg    Ideal Body Weight:  67.2 kg  BMI:  Body mass index is 17.23 kg/m.  Estimated Nutritional Needs:   Kcal:  1500-1700  Protein:  75-90 grams  Fluid:  > 1.5 L    Levada Schilling, RD, LDN, CDCES Registered Dietitian III Certified Diabetes Care and Education Specialist If unable to reach this RD, please use "RD Inpatient" group chat on secure chat between hours of 8am-4 pm daily

## 2024-02-10 NOTE — Progress Notes (Signed)
 [facility] Progress Note  Patient: [name]    WUJ:[id number]  DOA: 02/09/2024     Date of Service: the patient was seen and examined on 02/10/2024  Chief Complaint  Patient presents with   Weakness   Brief hospital course: Below reviewed from HPI  Terry [name] is a 88 y.o. male with medical history significant of hypertension, hyperlipidemia, CAD, stent placement, diastolic CHF, bladder cancer, s/p of ileal conduit, bullous pemphigoid, kidney stone, CAD, who presents with altered mental status.   Patient has AMS, and is unable to provide accurate medical history, therefore, most of the history is obtained by discussing the case with ED physician, per EMS report, and with the nursing staff.    Per report, patient has been confused in the past several days.  EMS reported that the patient has hypoglycemia with blood glucose initially of 63 and increased with oral glucose to 101.  Patient is reportedly had urinary tract infection since January and has been given antibiotic treatment.  Patient has a generalized weakness, poor appetite, and decreased oral intake.   When I saw patient on the floor, patient has mild dry cough, no respiratory distress, no active nausea, vomiting, diarrhea noted.  Does not seem to have abdominal pain or chest pain.  Patient is confused, knows his own name, not orientated to the place and time.  He moves all extremities.  No facial droop or slurred speech.   Data reviewed independently and ED Course: pt was found to have positive PCR for COVID, WBC 8.0, AKI with creatinine 1.39, BUN 28, GFR 50 (recent baseline creatinine 0.96 on 01/11/2024), positive UA (cloudy appearance, large amount of leukocyte, many bacteria, WBC> 50), temperature normal, blood pressure 122/56, heart rate of 103, RR 24, oxygen saturation 100% on room air.  CT of head is negative.  Chest x-ray negative for infiltration.  Patient is admitted to telemetry bed as inpatient.       EKG: I have personally reviewed.  Seem to be sinus rhythm with frequent PAC, QTc 432, LAD   Assessment and Plan:  COVID-19 virus infection: No oxygen desaturation.  Chest x-ray negative for infiltration.  No fever, clinically not septic. -Continue supportive care -Bronchodilators and prn Robitussin for cough   UTI (urinary tract infection) -Rocephin -Follow-up urine culture   Acute metabolic encephalopathy: CT head negative, possibly due to UTI and COVID viral infection -Continue fall precaution   Hypoglycemia -CBG check every 4 hours -As needed D50 -IV fluid: D5-normal saline at 75 cc/h  Hypophosphatemia, Phos repleted. Hypokalemia: Potassium repleted Monitor electrolytes and replete as needed   Coronary artery disease: S/p of stent placement. -Brilinta, Crestor   Hypercholesteremia: on Crestor   Benign essential HTN: Patient not taking medications currently.   -IV hydralazine as needed   AKI (acute kidney injury) (HCC): Likely due to dehydration and UTI -IV fluid as above -Avoid using renal toxic medications Mild acidosis, started bicarbonate oral supplement   Thrombocytopenia (HCC): Platelet 92---66 -LDH 276  Monitor CBC   Protein-calorie malnutrition, severe (HCC): Body weight 49.9 kg, BMI 17.23 -Ensure -Consult to nutrition   Abnormal EKG: EKG seems to be sinus rhythm with frequent PAC's,  -Repeat EKG for further clarification     Body mass index is 17.23 kg/m.  Nutrition Problem: Severe Malnutrition Etiology: chronic illness (CHF) Interventions: Interventions: Ensure Enlive (each supplement provides 350kcal and 20 grams of protein), MVI, Liberalize Diet  Pressure Injury 02/09/24 Sacrum Medial (Active)  02/09/24 2332  Location: Sacrum  Location Orientation: Medial  Staging:   Wound Description (Comments):   Present on Admission: Yes  Dressing Type Foam - Lift dressing to assess site every shift 02/10/24 0746     Diet: Regular diet DVT  Prophylaxis: SCD, pharmacological prophylaxis contraindicated due to thrombocytopenia    Advance goals of care discussion: DNR/DNI-limited  Family Communication: family was present at bedside, at the time of interview.  The pt provided permission to discuss medical plan with the family. Opportunity was given to ask question and all questions were answered satisfactorily.   Disposition:  Pt is from Home, admitted with AMS< UTI and COVID, still on IV abx, Urine Cx is pending, which precludes a safe discharge. Discharge to SNF, when stable.  Subjective: No significant events overnight, patient still feels generalized weakness and tiredness, able to work with PT.  Denied any specific complaints.  Physical Exam: General: NAD, lying comfortably Appear in no distress, affect appropriate Eyes: PERRLA ENT: Oral Mucosa Clear, moist  Neck: no JVD,  Cardiovascular: S1 and S2 Present, no Murmur,  Respiratory: good respiratory effort, Bilateral Air entry equal and Decreased, no Crackles, no wheezes Abdomen: Bowel Sound present, Soft and no tenderness, urostomy intact Skin: no rashes Extremities: no Pedal edema, no calf tenderness Neurologic: without any new focal findings Gait not checked due to patient safety concerns  Vitals:   02/09/24 2207 02/09/24 2250 02/10/24 0817 02/10/24 1558  BP: 133/64  125/64 132/73  Pulse: 84  87 94  Resp: 20  16 17   Temp: 98.1 F (36.7 C)  97.7 F (36.5 C) 98.3 F (36.8 C)  TempSrc:   Oral Oral  SpO2: 100%  100% 100%  Weight:  49.9 kg    Height:  5\' 7"  (1.702 m)      Intake/Output Summary (Last 24 hours) at 02/10/2024 1711 Last data filed at 02/10/2024 1500 Gross per 24 hour  Intake 1452.11 ml  Output 1150 ml  Net 302.11 ml   Filed Weights   02/09/24 1929 02/09/24 2250  Weight: 52.2 kg 49.9 kg    Data Reviewed: I have personally reviewed and interpreted daily labs, tele strips, imagings as discussed above. I reviewed all nursing notes, pharmacy  notes, vitals, pertinent old records I have discussed plan of care as described above with RN and patient/family.  CBC: Recent Labs  Lab 02/09/24 1515 02/10/24 0615  WBC 8.0 3.7*  HGB 11.7* 10.2*  HCT 36.0* 31.4*  MCV 97.6 98.1  PLT 92* 66*   Basic Metabolic Panel: Recent Labs  Lab 02/09/24 1515 02/10/24 0615  NA 137 138  K 3.0* 3.8  CL 109 115*  CO2 19* 19*  GLUCOSE 109* 108*  BUN 28* 24*  CREATININE 1.39* 1.16  CALCIUM 9.0 8.5*  MG 2.2  --   PHOS 1.8* 2.4*    Studies: DG Chest Port 1 View Result Date: 02/09/2024 CLINICAL DATA:  Short of breath, weakness, confusion EXAM: PORTABLE CHEST 1 VIEW COMPARISON:  01/08/2024 FINDINGS: Single frontal view of the chest demonstrates a stable right chest wall port. The cardiac silhouette is unremarkable. Stable areas of scarring compatible with emphysema. No acute airspace disease, effusion, or pneumothorax. No acute bony abnormalities. IMPRESSION: 1. Findings consistent with background emphysema, stable. No acute airspace disease. Electronically Signed   By: Sharlet Salina M.D.   On: 02/09/2024 19:54   CT Head Wo Contrast Result Date: 02/09/2024 CLINICAL DATA:  Mental status change of unknown cause. Hypoglycemia. EXAM: CT HEAD WITHOUT CONTRAST TECHNIQUE: Contiguous axial images were  obtained from the base of the skull through the vertex without intravenous contrast. RADIATION DOSE REDUCTION: This exam was performed according to the departmental dose-optimization program which includes automated exposure control, adjustment of the mA and/or kV according to patient size and/or use of iterative reconstruction technique. COMPARISON:  None Available. FINDINGS: Brain: Diffuse cerebral atrophy. Ventricular dilatation consistent with central atrophy. Low-attenuation changes in the deep white matter consistent with small vessel ischemia. No abnormal extra-axial fluid collections. No mass effect or midline shift. Gray-white matter junctions are  distinct. Basal cisterns are not effaced. No acute intracranial hemorrhage. Vascular: No hyperdense vessel or unexpected calcification. Skull: Normal. Negative for fracture or focal lesion. Sinuses/Orbits: Mucosal thickening in the paranasal sinuses. No acute air-fluid levels. Mastoid air cells are clear. Other: None. IMPRESSION: No acute intracranial abnormalities. Chronic atrophy and small vessel ischemic changes. Electronically Signed   By: Burman Nieves M.D.   On: 02/09/2024 19:21    Scheduled Meds:  cholecalciferol  1,000 Units Oral Daily   feeding supplement  237 mL Oral TID BM   folic acid  1 mg Oral Daily   loratadine  10 mg Oral Daily   [START ON 02/12/2024] methotrexate  15 mg Oral Weekly   multivitamin with minerals  1 tablet Oral Daily   rosuvastatin  20 mg Oral Daily   ticagrelor  90 mg Oral BID   Continuous Infusions:  cefTRIAXone (ROCEPHIN)  IV Stopped (02/09/24 2250)   dextrose 5 % and 0.9 % NaCl 75 mL/hr at 02/10/24 0610   PRN Meds: diphenhydrAMINE **AND** acetaminophen, acetaminophen, albuterol, dextrose, guaiFENesin, hydrALAZINE, nitroGLYCERIN, ondansetron (ZOFRAN) IV  Time spent: 55 minutes  Author: Gillis Santa. MD [facility] 02/10/2024 5:11 PM  To reach On-call, see care teams to locate the attending and reach out to them via [email or web address]. If 7PM-7AM, please contact night-coverage If you still have difficulty reaching the attending provider, please page the [facility] (Director on Call) for [facility] on amion for assistance.

## 2024-02-11 DIAGNOSIS — U071 COVID-19: Secondary | ICD-10-CM | POA: Diagnosis not present

## 2024-02-11 LAB — BASIC METABOLIC PANEL
Anion gap: 6 (ref 5–15)
BUN: 20 mg/dL (ref 8–23)
CO2: 18 mmol/L — ABNORMAL LOW (ref 22–32)
Calcium: 8.5 mg/dL — ABNORMAL LOW (ref 8.9–10.3)
Chloride: 114 mmol/L — ABNORMAL HIGH (ref 98–111)
Creatinine, Ser: 1.11 mg/dL (ref 0.61–1.24)
GFR, Estimated: >60 mL/min (ref >60)
Glucose, Bld: 117 mg/dL — ABNORMAL HIGH (ref 70–99)
Potassium: 3.4 mmol/L — ABNORMAL LOW (ref 3.5–5.1)
Sodium: 138 mmol/L (ref 135–145)

## 2024-02-11 LAB — GLUCOSE, CAPILLARY
Glucose-Capillary: 93 mg/dL (ref 70–99)
Glucose-Capillary: 106 mg/dL — ABNORMAL HIGH (ref 70–99)
Glucose-Capillary: 68 mg/dL — ABNORMAL LOW (ref 70–99)
Glucose-Capillary: 85 mg/dL (ref 70–99)
Glucose-Capillary: 84 mg/dL (ref 70–99)
Glucose-Capillary: 118 mg/dL — ABNORMAL HIGH (ref 70–99)
Glucose-Capillary: 93 mg/dL (ref 70–99)

## 2024-02-11 LAB — CBC
HCT: 31.1 % — ABNORMAL LOW (ref 39.0–52.0)
Hemoglobin: 10.4 g/dL — ABNORMAL LOW (ref 13.0–17.0)
MCH: 31.8 pg (ref 26.0–34.0)
MCHC: 33.4 g/dL (ref 30.0–36.0)
MCV: 95.1 fL (ref 80.0–100.0)
Platelets: 49 10*3/uL — ABNORMAL LOW (ref 150–400)
RBC: 3.27 MIL/uL — ABNORMAL LOW (ref 4.22–5.81)
RDW: 17.7 % — ABNORMAL HIGH (ref 11.5–15.5)
WBC: 3.8 10*3/uL — ABNORMAL LOW (ref 4.0–10.5)
nRBC: 0 % (ref 0.0–0.2)

## 2024-02-11 LAB — PHOSPHORUS: Phosphorus: 2.2 mg/dL — ABNORMAL LOW (ref 2.5–4.6)

## 2024-02-11 LAB — MAGNESIUM: Magnesium: 2 mg/dL (ref 1.7–2.4)

## 2024-02-11 MED ORDER — K PHOS MONO-SOD PHOS DI & MONO 155-852-130 MG PO TABS
500.0000 mg | ORAL_TABLET | Freq: Four times a day (QID) | ORAL | Status: AC
  Administered 2024-02-11 (×2): 500 mg via ORAL
  Filled 2024-02-11 (×3): qty 2

## 2024-02-11 MED ORDER — SODIUM BICARBONATE 650 MG PO TABS
650.0000 mg | ORAL_TABLET | Freq: Three times a day (TID) | ORAL | Status: DC
  Administered 2024-02-11 (×3): 650 mg via ORAL
  Filled 2024-02-11 (×4): qty 1

## 2024-02-11 MED ORDER — POTASSIUM CHLORIDE CRYS ER 20 MEQ PO TBCR
40.0000 meq | EXTENDED_RELEASE_TABLET | Freq: Once | ORAL | Status: AC
  Administered 2024-02-11: 40 meq via ORAL
  Filled 2024-02-11: qty 2

## 2024-02-11 NOTE — Plan of Care (Signed)
  Problem: Coping: Goal: Psychosocial and spiritual needs will be supported Outcome: Progressing   Problem: Respiratory: Goal: Will maintain a patent airway Outcome: Progressing Goal: Complications related to the disease process, condition or treatment will be avoided or minimized Outcome: Progressing   Problem: Health Behavior/Discharge Planning: Goal: Ability to manage health-related needs will improve Outcome: Progressing   Problem: Clinical Measurements: Goal: Ability to maintain clinical measurements within normal limits will improve Outcome: Progressing Goal: Will remain free from infection Outcome: Progressing Goal: Diagnostic test results will improve Outcome: Progressing Goal: Respiratory complications will improve Outcome: Progressing Goal: Cardiovascular complication will be avoided Outcome: Progressing   Problem: Activity: Goal: Risk for activity intolerance will decrease Outcome: Progressing   Problem: Nutrition: Goal: Adequate nutrition will be maintained Outcome: Progressing   Problem: Coping: Goal: Level of anxiety will decrease Outcome: Progressing   Problem: Elimination: Goal: Will not experience complications related to bowel motility Outcome: Progressing Goal: Will not experience complications related to urinary retention Outcome: Progressing   Problem: Pain Managment: Goal: General experience of comfort will improve and/or be controlled Outcome: Progressing   Problem: Safety: Goal: Ability to remain free from injury will improve Outcome: Progressing   Problem: Skin Integrity: Goal: Risk for impaired skin integrity will decrease Outcome: Progressing

## 2024-02-11 NOTE — Progress Notes (Signed)
 [facility] Progress Note  Patient: Terry [name]    WGN:[id number]  DOA: 02/09/2024     Date of Service: the patient was seen and examined on 02/11/2024  Chief Complaint  Patient presents with   Weakness   Brief hospital course: Below reviewed from HPI  Terry [name] is a 88 y.o. male with medical history significant of hypertension, hyperlipidemia, CAD, stent placement, diastolic CHF, bladder cancer, s/p of ileal conduit, bullous pemphigoid, kidney stone, CAD, who presents with altered mental status.   Patient has AMS, and is unable to provide accurate medical history, therefore, most of the history is obtained by discussing the case with ED physician, per EMS report, and with the nursing staff.    Per report, patient has been confused in the past several days.  EMS reported that the patient has hypoglycemia with blood glucose initially of 63 and increased with oral glucose to 101.  Patient is reportedly had urinary tract infection since January and has been given antibiotic treatment.  Patient has a generalized weakness, poor appetite, and decreased oral intake.   When I saw patient on the floor, patient has mild dry cough, no respiratory distress, no active nausea, vomiting, diarrhea noted.  Does not seem to have abdominal pain or chest pain.  Patient is confused, knows his own name, not orientated to the place and time.  He moves all extremities.  No facial droop or slurred speech.   Data reviewed independently and ED Course: pt was found to have positive PCR for COVID, WBC 8.0, AKI with creatinine 1.39, BUN 28, GFR 50 (recent baseline creatinine 0.96 on 01/11/2024), positive UA (cloudy appearance, large amount of leukocyte, many bacteria, WBC> 50), temperature normal, blood pressure 122/56, heart rate of 103, RR 24, oxygen saturation 100% on room air.  CT of head is negative.  Chest x-ray negative for infiltration.  Patient is admitted to telemetry bed as inpatient.       EKG: I have personally reviewed.  Seem to be sinus rhythm with frequent PAC, QTc 432, LAD   Assessment and Plan:  COVID-19 virus infection: No oxygen desaturation.  Chest x-ray negative for infiltration.  No fever, clinically not septic. -Continue supportive care -Bronchodilators and prn Robitussin for cough   UTI (urinary tract infection) -Rocephin -Follow-up urine culture growing Citrobacter freundii, sensitivity report is pending   Acute metabolic encephalopathy: CT head negative, possibly due to UTI and COVID viral infection -Continue fall precaution   Hypoglycemia -CBG check every 4 hours -As needed D50 -s/p IV fluid D5, changed to NS at 75 cc/h  Hypophosphatemia, Phos repleted. Hypokalemia: Potassium repleted Monitor electrolytes and replete as needed   Coronary artery disease: S/p of stent placement. -Brilinta, Crestor   Hypercholesteremia: on Crestor   Benign essential HTN: Patient not taking medications currently.   -IV hydralazine as needed   AKI (acute kidney injury) (HCC): Likely due to dehydration and UTI -IV fluid as above -Avoid using renal toxic medications Mild acidosis, started bicarbonate oral supplement   Thrombocytopenia (HCC): Platelet 92---66--49 -LDH 276, peripheral smear shows normal platelet morphology Monitor CBC   Protein-calorie malnutrition, severe (HCC): Body weight 49.9 kg, BMI 17.23 -Ensure -Consult to nutrition   Abnormal EKG: EKG seems to be sinus rhythm with frequent PAC's,  -Repeat EKG for further clarification     Body mass index is 17.23 kg/m.  Nutrition Problem: Severe Malnutrition Etiology: chronic illness (CHF) Interventions: Interventions: Ensure Enlive (each supplement provides 350kcal and 20 grams of protein), MVI,  Liberalize Diet  Pressure Injury 02/09/24 Sacrum Medial (Active)  02/09/24 2332  Location: Sacrum  Location Orientation: Medial  Staging:   Wound Description (Comments):   Present on Admission:  Yes  Dressing Type Foam - Lift dressing to assess site every shift 02/11/24 0802     Diet: Regular diet DVT Prophylaxis: SCD, pharmacological prophylaxis contraindicated due to thrombocytopenia    Advance goals of care discussion: DNR/DNI-limited  Family Communication: family was present at bedside, at the time of interview.  The pt provided permission to discuss medical plan with the family. Opportunity was given to ask question and all questions were answered satisfactorily.   Disposition:  Pt is from Home, admitted with AMS, UTI and COVID, still on IV abx, Urine Cx is pending, which precludes a safe discharge. Discharge to SNF, when stable.  Subjective: No significant events overnight, patient denied any speech complaint, lying comfortably in the bed, feels generalized weakness and tiredness.  Physical Exam: General: NAD, lying comfortably Appear in no distress, affect appropriate Eyes: PERRLA ENT: Oral Mucosa Clear, moist  Neck: no JVD,  Cardiovascular: S1 and S2 Present, no Murmur,  Respiratory: good respiratory effort, Bilateral Air entry equal and Decreased, no Crackles, no wheezes Abdomen: Bowel Sound present, Soft and no tenderness, urostomy intact Skin: no rashes Extremities: no Pedal edema, no calf tenderness Neurologic: without any new focal findings Gait not checked due to patient safety concerns  Vitals:   02/10/24 1558 02/10/24 2034 02/11/24 0836 02/11/24 1628  BP: 132/73 122/70 135/80 128/80  Pulse: 94 100 93 (!) 103  Resp: 17 16 17 17   Temp: 98.3 F (36.8 C) (!) 97.5 F (36.4 C) 97.7 F (36.5 C)   TempSrc: Oral  Oral   SpO2: 100% 97% 97% 99%  Weight:      Height:        Intake/Output Summary (Last 24 hours) at 02/11/2024 1646 Last data filed at 02/11/2024 1604 Gross per 24 hour  Intake 2287.36 ml  Output 1850 ml  Net 437.36 ml   Filed Weights   02/09/24 1929 02/09/24 2250  Weight: 52.2 kg 49.9 kg    Data Reviewed: I have personally reviewed  and interpreted daily labs, tele strips, imagings as discussed above. I reviewed all nursing notes, pharmacy notes, vitals, pertinent old records I have discussed plan of care as described above with RN and patient/family.  CBC: Recent Labs  Lab 02/09/24 1515 02/10/24 0615 02/11/24 0543  WBC 8.0 3.7* 3.8*  HGB 11.7* 10.2* 10.4*  HCT 36.0* 31.4* 31.1*  MCV 97.6 98.1 95.1  PLT 92* 66* 49*   Basic Metabolic Panel: Recent Labs  Lab 02/09/24 1515 02/10/24 0615 02/11/24 0543  NA 137 138 138  K 3.0* 3.8 3.4*  CL 109 115* 114*  CO2 19* 19* 18*  GLUCOSE 109* 108* 117*  BUN 28* 24* 20  CREATININE 1.39* 1.16 1.11  CALCIUM 9.0 8.5* 8.5*  MG 2.2  --  2.0  PHOS 1.8* 2.4* 2.2*    Studies: No results found.   Scheduled Meds:  cholecalciferol  1,000 Units Oral Daily   feeding supplement  237 mL Oral TID BM   folic acid  1 mg Oral Daily   loratadine  10 mg Oral Daily   [START ON 02/12/2024] methotrexate  15 mg Oral Weekly   multivitamin with minerals  1 tablet Oral Daily   phosphorus  500 mg Oral QID   rosuvastatin  20 mg Oral Daily   sodium bicarbonate  650 mg  Oral TID   ticagrelor  90 mg Oral BID   Continuous Infusions:  sodium chloride 75 mL/hr at 02/11/24 0624   cefTRIAXone (ROCEPHIN)  IV 1 g (02/10/24 2108)   PRN Meds: diphenhydrAMINE **AND** acetaminophen, acetaminophen, albuterol, dextrose, guaiFENesin, hydrALAZINE, nitroGLYCERIN, ondansetron (ZOFRAN) IV  Time spent: 55 minutes  Author: Gillis Santa. MD [facility] 02/11/2024 4:46 PM  To reach On-call, see care teams to locate the attending and reach out to them via [email or web address]. If 7PM-7AM, please contact night-coverage If you still have difficulty reaching the attending provider, please page the [name] (Director on Call) for [facility] on amion for assistance.

## 2024-02-12 DIAGNOSIS — I10 Essential (primary) hypertension: Secondary | ICD-10-CM | POA: Diagnosis not present

## 2024-02-12 DIAGNOSIS — D649 Anemia, unspecified: Secondary | ICD-10-CM

## 2024-02-12 DIAGNOSIS — U071 COVID-19: Secondary | ICD-10-CM | POA: Diagnosis not present

## 2024-02-12 DIAGNOSIS — D72819 Decreased white blood cell count, unspecified: Secondary | ICD-10-CM | POA: Diagnosis not present

## 2024-02-12 DIAGNOSIS — Z8551 Personal history of malignant neoplasm of bladder: Secondary | ICD-10-CM

## 2024-02-12 LAB — PROTIME-INR
INR: 1.2 (ref 0.8–1.2)
Prothrombin Time: 15.7 s — ABNORMAL HIGH (ref 11.4–15.2)

## 2024-02-12 LAB — RETICULOCYTES
Immature Retic Fract: 13.3 % (ref 2.3–15.9)
RBC.: 3.51 MIL/uL — ABNORMAL LOW (ref 4.22–5.81)
Retic Count, Absolute: 23.9 10*3/uL (ref 19.0–186.0)
Retic Ct Pct: 0.7 % (ref 0.4–3.1)

## 2024-02-12 LAB — BASIC METABOLIC PANEL
Anion gap: 5 (ref 5–15)
BUN: 18 mg/dL (ref 8–23)
CO2: 18 mmol/L — ABNORMAL LOW (ref 22–32)
Calcium: 8.8 mg/dL — ABNORMAL LOW (ref 8.9–10.3)
Chloride: 117 mmol/L — ABNORMAL HIGH (ref 98–111)
Creatinine, Ser: 1.11 mg/dL (ref 0.61–1.24)
GFR, Estimated: >60 mL/min (ref >60)
Glucose, Bld: 84 mg/dL (ref 70–99)
Potassium: 3.6 mmol/L (ref 3.5–5.1)
Sodium: 140 mmol/L (ref 135–145)

## 2024-02-12 LAB — CBC WITH DIFFERENTIAL/PLATELET
Abs Immature Granulocytes: 0.02 10*3/uL (ref 0.00–0.07)
Abs Immature Granulocytes: 0.04 10*3/uL (ref 0.00–0.07)
Basophils Absolute: 0 10*3/uL (ref 0.0–0.1)
Basophils Absolute: 0 10*3/uL (ref 0.0–0.1)
Basophils Relative: 0 %
Basophils Relative: 0 %
Eosinophils Absolute: 0 10*3/uL (ref 0.0–0.5)
Eosinophils Absolute: 0 10*3/uL (ref 0.0–0.5)
Eosinophils Relative: 0 %
Eosinophils Relative: 0 %
HCT: 31 % — ABNORMAL LOW (ref 39.0–52.0)
HCT: 33.4 % — ABNORMAL LOW (ref 39.0–52.0)
Hemoglobin: 10.6 g/dL — ABNORMAL LOW (ref 13.0–17.0)
Hemoglobin: 11.3 g/dL — ABNORMAL LOW (ref 13.0–17.0)
Immature Granulocytes: 1 %
Immature Granulocytes: 1 %
Lymphocytes Relative: 15 %
Lymphocytes Relative: 10 %
Lymphs Abs: 0.4 10*3/uL — ABNORMAL LOW (ref 0.7–4.0)
Lymphs Abs: 0.4 10*3/uL — ABNORMAL LOW (ref 0.7–4.0)
MCH: 31.9 pg (ref 26.0–34.0)
MCH: 31.7 pg (ref 26.0–34.0)
MCHC: 34.2 g/dL (ref 30.0–36.0)
MCHC: 33.8 g/dL (ref 30.0–36.0)
MCV: 93.4 fL (ref 80.0–100.0)
MCV: 93.8 fL (ref 80.0–100.0)
Monocytes Absolute: 0.3 10*3/uL (ref 0.1–1.0)
Monocytes Absolute: 0.3 10*3/uL (ref 0.1–1.0)
Monocytes Relative: 9 %
Monocytes Relative: 8 %
Neutro Abs: 2 10*3/uL (ref 1.7–7.7)
Neutro Abs: 2.9 10*3/uL (ref 1.7–7.7)
Neutrophils Relative %: 75 %
Neutrophils Relative %: 81 %
Platelets: 40 10*3/uL — ABNORMAL LOW (ref 150–400)
Platelets: 44 10*3/uL — ABNORMAL LOW (ref 150–400)
RBC: 3.32 MIL/uL — ABNORMAL LOW (ref 4.22–5.81)
RBC: 3.56 MIL/uL — ABNORMAL LOW (ref 4.22–5.81)
RDW: 17.9 % — ABNORMAL HIGH (ref 11.5–15.5)
RDW: 17.9 % — ABNORMAL HIGH (ref 11.5–15.5)
Smear Review: DECREASED
Smear Review: DECREASED
WBC: 2.7 10*3/uL — ABNORMAL LOW (ref 4.0–10.5)
WBC: 3.7 10*3/uL — ABNORMAL LOW (ref 4.0–10.5)
nRBC: 0 % (ref 0.0–0.2)
nRBC: 0 % (ref 0.0–0.2)

## 2024-02-12 LAB — GLUCOSE, CAPILLARY
Glucose-Capillary: 82 mg/dL (ref 70–99)
Glucose-Capillary: 73 mg/dL (ref 70–99)
Glucose-Capillary: 125 mg/dL — ABNORMAL HIGH (ref 70–99)
Glucose-Capillary: 79 mg/dL (ref 70–99)

## 2024-02-12 LAB — HEPATIC FUNCTION PANEL
ALT: 76 U/L — ABNORMAL HIGH (ref 0–44)
AST: 115 U/L — ABNORMAL HIGH (ref 15–41)
Albumin: 2.2 g/dL — ABNORMAL LOW (ref 3.5–5.0)
Alkaline Phosphatase: 114 U/L (ref 38–126)
Bilirubin, Direct: 0.8 mg/dL — ABNORMAL HIGH (ref 0.0–0.2)
Indirect Bilirubin: 1.4 mg/dL — ABNORMAL HIGH (ref 0.3–0.9)
Total Bilirubin: 2.2 mg/dL — ABNORMAL HIGH (ref 0.0–1.2)
Total Protein: 5 g/dL — ABNORMAL LOW (ref 6.5–8.1)

## 2024-02-12 LAB — CBC
HCT: 31.4 % — ABNORMAL LOW (ref 39.0–52.0)
Hemoglobin: 10.5 g/dL — ABNORMAL LOW (ref 13.0–17.0)
MCH: 31.6 pg (ref 26.0–34.0)
MCHC: 33.4 g/dL (ref 30.0–36.0)
MCV: 94.6 fL (ref 80.0–100.0)
Platelets: 40 10*3/uL — ABNORMAL LOW (ref 150–400)
RBC: 3.32 MIL/uL — ABNORMAL LOW (ref 4.22–5.81)
RDW: 17.8 % — ABNORMAL HIGH (ref 11.5–15.5)
WBC: 2.7 10*3/uL — ABNORMAL LOW (ref 4.0–10.5)
nRBC: 0 % (ref 0.0–0.2)

## 2024-02-12 LAB — TECHNOLOGIST SMEAR REVIEW: Plt Morphology: DECREASED

## 2024-02-12 LAB — FIBRINOGEN: Fibrinogen: 270 mg/dL (ref 210–475)

## 2024-02-12 LAB — PHOSPHORUS: Phosphorus: 2.2 mg/dL — ABNORMAL LOW (ref 2.5–4.6)

## 2024-02-12 LAB — VITAMIN B12: Vitamin B-12: 1399 pg/mL — ABNORMAL HIGH (ref 180–914)

## 2024-02-12 LAB — APTT: aPTT: 39 s — ABNORMAL HIGH (ref 24–36)

## 2024-02-12 LAB — LACTATE DEHYDROGENASE: LDH: 239 U/L — ABNORMAL HIGH (ref 98–192)

## 2024-02-12 LAB — MAGNESIUM: Magnesium: 1.9 mg/dL (ref 1.7–2.4)

## 2024-02-12 LAB — FERRITIN: Ferritin: 500 ng/mL — ABNORMAL HIGH (ref 24–336)

## 2024-02-12 MED ORDER — CIPROFLOXACIN HCL 500 MG PO TABS
500.0000 mg | ORAL_TABLET | Freq: Two times a day (BID) | ORAL | Status: DC
  Administered 2024-02-12 – 2024-02-14 (×6): 500 mg via ORAL
  Filled 2024-02-12 (×9): qty 1

## 2024-02-12 MED ORDER — POTASSIUM CHLORIDE 20 MEQ PO PACK
40.0000 meq | PACK | Freq: Once | ORAL | Status: AC
  Administered 2024-02-12: 40 meq via ORAL
  Filled 2024-02-12: qty 2

## 2024-02-12 MED ORDER — K PHOS MONO-SOD PHOS DI & MONO 155-852-130 MG PO TABS
500.0000 mg | ORAL_TABLET | Freq: Three times a day (TID) | ORAL | Status: DC
Start: 2024-02-12 — End: 2024-02-13
  Filled 2024-02-12: qty 2

## 2024-02-12 MED ORDER — CIPROFLOXACIN HCL 500 MG PO TABS
500.0000 mg | ORAL_TABLET | Freq: Two times a day (BID) | ORAL | Status: DC
  Filled 2024-02-12: qty 1

## 2024-02-12 MED ORDER — SODIUM BICARBONATE 650 MG PO TABS
650.0000 mg | ORAL_TABLET | Freq: Three times a day (TID) | ORAL | Status: AC
  Administered 2024-02-12 – 2024-02-14 (×8): 650 mg via ORAL
  Filled 2024-02-12 (×9): qty 1

## 2024-02-12 NOTE — Plan of Care (Signed)
  Problem: Education: Goal: Knowledge of risk factors and measures for prevention of condition will improve Outcome: Progressing   Problem: Coping: Goal: Psychosocial and spiritual needs will be supported Outcome: Progressing   Problem: Respiratory: Goal: Will maintain a patent airway Outcome: Progressing Goal: Complications related to the disease process, condition or treatment will be avoided or minimized Outcome: Progressing   Problem: Health Behavior/Discharge Planning: Goal: Ability to manage health-related needs will improve Outcome: Progressing   Problem: Clinical Measurements: Goal: Ability to maintain clinical measurements within normal limits will improve Outcome: Progressing Goal: Will remain free from infection Outcome: Progressing Goal: Diagnostic test results will improve Outcome: Progressing Goal: Respiratory complications will improve Outcome: Progressing Goal: Cardiovascular complication will be avoided Outcome: Progressing   Problem: Activity: Goal: Risk for activity intolerance will decrease Outcome: Progressing   Problem: Coping: Goal: Level of anxiety will decrease Outcome: Progressing   Problem: Elimination: Goal: Will not experience complications related to bowel motility Outcome: Progressing Goal: Will not experience complications related to urinary retention Outcome: Progressing   Problem: Pain Managment: Goal: General experience of comfort will improve and/or be controlled Outcome: Progressing   Problem: Safety: Goal: Ability to remain free from injury will improve Outcome: Progressing   Problem: Skin Integrity: Goal: Risk for impaired skin integrity will decrease Outcome: Progressing

## 2024-02-12 NOTE — Progress Notes (Signed)
 Physical Therapy Treatment Patient Details Name: Terry [name] [facility] MRN: [id number] DOB: [birth year] Today's Date: 02/12/2024   History of Present Illness Pt  is an 88 y.o. male who presents with altered mental status. Admitted for Covid 19, UTI, and acute metabolic encephalopathy. PMH of hypertension, hyperlipidemia, CAD, stent placement, diastolic CHF, bladder cancer, s/p of ileal conduit, bullous pemphigoid, kidney stone, CAD.    PT Comments  Pt with general malaise.  He is assisted to sitting to/from EOB with max a x 1.  He is generally steady in sitting but is unable to stand or progress mobility at this time.  Fatigue with activity and needs max a x 1 for rolling for pericare and new dressing with nursing.   Original recommendations for HHPT but pt has generally declined since admission and SNF will likely be needed at this point upon discharge.   If plan is discharge home, recommend the following:     Can travel by private vehicle        Equipment Recommendations       Recommendations for Other Services       Precautions / Restrictions Precautions Precautions: Fall Restrictions Weight Bearing Restrictions Per Provider Order: No     Mobility  Bed Mobility Overal bed mobility: Needs Assistance Bed Mobility: Supine to Sit, Sit to Supine     Supine to sit: Max assist, Total assist Sit to supine: Max assist, Total assist        Transfers                   General transfer comment: deferred    Ambulation/Gait                   Stairs             Wheelchair Mobility     Tilt Bed    Modified Rankin (Stroke Patients Only)       Balance Overall balance assessment: Needs assistance Sitting-balance support: Feet supported Sitting balance-Leahy Scale: Good Sitting balance - Comments: able to sit with no assist                                    Communication    Cognition Arousal: Lethargic, Alert Behavior During  Therapy: WFL for tasks assessed/performed, Flat affect   PT - Cognitive impairments: History of cognitive impairments                                Cueing    Exercises Other Exercises Other Exercises: sat EOB 5 minutes    General Comments        Pertinent Vitals/Pain Pain Assessment Pain Assessment: No/denies pain    Home Living                          Prior Function            PT Goals (current goals can now be found in the care plan section) Progress towards PT goals: Not progressing toward goals - comment    Frequency    Min 1X/week      PT Plan      Co-evaluation              AM-PAC PT "6 Clicks" Mobility   Outcome Measure  Help needed turning from your back  to your side while in a flat bed without using bedrails?: A Lot Help needed moving from lying on your back to sitting on the side of a flat bed without using bedrails?: A Lot Help needed moving to and from a bed to a chair (including a wheelchair)?: Total Help needed standing up from a chair using your arms (e.g., wheelchair or bedside chair)?: Total Help needed to walk in hospital room?: Total Help needed climbing 3-5 steps with a railing? : Total 6 Click Score: 8    End of Session   Activity Tolerance: Patient limited by fatigue Patient left: in bed;with call bell/phone within reach;with bed alarm set;with family/visitor present;with nursing/sitter in room Nurse Communication: Mobility status PT Visit Diagnosis: Unsteadiness on feet (R26.81);Muscle weakness (generalized) (M62.81);Difficulty in walking, not elsewhere classified (R26.2)     Time: 9604-5409 PT Time Calculation (min) (ACUTE ONLY): 25 min  Charges:    $Therapeutic Activity: 8-22 mins PT General Charges $$ ACUTE PT VISIT: 1 Visit                   Danielle Dess, PTA 02/12/24, 1:19 PM

## 2024-02-12 NOTE — Care Management Important Message (Signed)
 Important Message  Patient Details  Name: Terry [name] [facility] MRN: [id number] Date of Birth: [birth year]   Important Message Given:  Yes - Medicare IM     Cristela Blue, CMA 02/12/2024, 10:58 AM

## 2024-02-12 NOTE — TOC Progression Note (Signed)
 Transition of Care [facility]) - Progression Note    Patient Details  Name: Terry [name] MRN: [id number] Date of Birth: [birth year]  Transition of Care [facility]) CM/SW Contact  Marlowe Sax, RN Phone Number: 02/12/2024, 12:33 PM  Clinical Narrative:     Spoke with [name] and reviewed the bed offers, she stated that he is not medically clear until maybe Monday or Tuesday she chose [facility], I notified tammy at [facility] and will have Weekend staff at [facility] start ins on Sunday       Expected Discharge Plan and Services                                               Social Determinants of Health (SDOH) Interventions SDOH Screenings   Food Insecurity: No Food Insecurity (02/09/2024)  Housing: Low Risk  (02/09/2024)  Transportation Needs: No Transportation Needs (02/09/2024)  Utilities: Not At Risk (02/09/2024)  Alcohol Screen: Low Risk  (08/26/2019)  Depression (PHQ2-9): Low Risk  (02/01/2020)  Financial Resource Strain: Low Risk  (08/27/2023)   Received from [facility]  Physical Activity: Inactive (02/01/2020)  Social Connections: Socially Isolated (02/09/2024)  Stress: No Stress Concern Present (02/01/2020)  Tobacco Use: Medium Risk (02/09/2024)    Readmission Risk Interventions     No data to display

## 2024-02-12 NOTE — Progress Notes (Signed)
 [facility] Progress Note  Patient: Terry [name]    WUJ:[id number]  DOA: 02/09/2024     Date of Service: the patient was seen and examined on 02/12/2024  Chief Complaint  Patient presents with   Weakness   Brief hospital course: Below reviewed from HPI  Terry [name] is a 88 y.o. male with medical history significant of hypertension, hyperlipidemia, CAD, stent placement, diastolic CHF, bladder cancer, s/p of ileal conduit, bullous pemphigoid, kidney stone, CAD, who presents with altered mental status.   Patient has AMS, and is unable to provide accurate medical history, therefore, most of the history is obtained by discussing the case with ED physician, per EMS report, and with the nursing staff.    Per report, patient has been confused in the past several days.  EMS reported that the patient has hypoglycemia with blood glucose initially of 63 and increased with oral glucose to 101.  Patient is reportedly had urinary tract infection since January and has been given antibiotic treatment.  Patient has a generalized weakness, poor appetite, and decreased oral intake.   When I saw patient on the floor, patient has mild dry cough, no respiratory distress, no active nausea, vomiting, diarrhea noted.  Does not seem to have abdominal pain or chest pain.  Patient is confused, knows his own name, not orientated to the place and time.  He moves all extremities.  No facial droop or slurred speech.   Data reviewed independently and ED Course: pt was found to have positive PCR for COVID, WBC 8.0, AKI with creatinine 1.39, BUN 28, GFR 50 (recent baseline creatinine 0.96 on 01/11/2024), positive UA (cloudy appearance, large amount of leukocyte, many bacteria, WBC> 50), temperature normal, blood pressure 122/56, heart rate of 103, RR 24, oxygen saturation 100% on room air.  CT of head is negative.  Chest x-ray negative for infiltration.  Patient is admitted to telemetry bed as inpatient.       EKG: I have personally reviewed.  Seem to be sinus rhythm with frequent PAC, QTc 432, LAD   Assessment and Plan:  COVID-19 virus infection: No oxygen desaturation.  Chest x-ray negative for infiltration.  No fever, clinically not septic. -Continue supportive care -Bronchodilators and prn Robitussin for cough   UTI (urinary tract infection) -s/p Rocephin d/c'd on 2/28 and started ciprofloxacin 500 mg p.o. twice daily for 7 days Ucx: Citrobacter freundii, resistant to ceftriaxone.  Sensitive to ciprofloxacin.   Pancytopenia, most likely due to COVID viral infection Thrombocytopenia: Platelet 92---66--49--40 -LDH 276, peripheral smear shows normal platelet morphology Monitor CBC Consulted with oncology  Acute metabolic encephalopathy: CT head negative, possibly due to UTI and COVID viral infection -Continue fall precaution   Hypoglycemia -CBG check every 4 hours -As needed D50 -s/p IV fluid D5, changed to NS at 75 cc/h  Hypophosphatemia, Phos repleted. Hypokalemia: Potassium repleted Monitor electrolytes and replete as needed   Coronary artery disease: S/p of stent placement. -Brilinta, Crestor   Hypercholesteremia: on Crestor   Benign essential HTN: Patient not taking medications currently.   -IV hydralazine as needed   AKI (acute kidney injury) (HCC): Likely due to dehydration and UTI -IV fluid as above -Avoid using renal toxic medications Mild acidosis, started bicarbonate oral supplement    Protein-calorie malnutrition, severe: Body weight 49.9 kg, BMI 17.23 -Ensure -Consult to nutrition   Abnormal EKG: EKG seems to be sinus rhythm with frequent PAC's,  -Repeat EKG for further clarification     Body mass index is 17.23  kg/m.  Nutrition Problem: Severe Malnutrition Etiology: chronic illness (CHF) Interventions: Interventions: Ensure Enlive (each supplement provides 350kcal and 20 grams of protein), MVI, Liberalize Diet  Pressure Injury 02/09/24 Sacrum  Medial (Active)  02/09/24 2332  Location: Sacrum  Location Orientation: Medial  Staging:   Wound Description (Comments):   Present on Admission: Yes  Dressing Type Foam - Lift dressing to assess site every shift 02/11/24 2000     Diet: Regular diet DVT Prophylaxis: SCD, pharmacological prophylaxis contraindicated due to thrombocytopenia    Advance goals of care discussion: DNR/DNI-limited  Family Communication: family was present at bedside, at the time of interview.  The pt provided permission to discuss medical plan with the family. Opportunity was given to ask question and all questions were answered satisfactorily.   Disposition:  Pt is from Home, admitted with AMS, UTI and COVID, thrombocytopenia and pancytopenia, which precludes a safe discharge. Discharge to SNF, when stable.  Subjective: No significant events overnight, mild cough and shortness of breath, no any other complaints, still feels generalized weakness and decreased p.o. intake.   Physical Exam: General: NAD, lying comfortably Appear in no distress, affect appropriate Eyes: PERRLA ENT: Oral Mucosa Clear, moist  Neck: no JVD,  Cardiovascular: S1 and S2 Present, no Murmur,  Respiratory: good respiratory effort, Bilateral Air entry equal and Decreased, no Crackles, no wheezes Abdomen: Bowel Sound present, Soft and no tenderness, urostomy intact Skin: no rashes Extremities: no Pedal edema, no calf tenderness Neurologic: without any new focal findings Gait not checked due to patient safety concerns  Vitals:   02/11/24 0836 02/11/24 1628 02/11/24 2330 02/12/24 0822  BP: 135/80 128/80 124/78 136/76  Pulse: 93 (!) 103 95 89  Resp: 17 17 17 16   Temp: 97.7 F (36.5 C) (!) 97.5 F (36.4 C) 97.7 F (36.5 C) 98.2 F (36.8 C)  TempSrc: Oral   Oral  SpO2: 97% 99% 99% 99%  Weight:      Height:        Intake/Output Summary (Last 24 hours) at 02/12/2024 1436 Last data filed at 02/12/2024 0500 Gross per 24 hour   Intake 724.86 ml  Output 1850 ml  Net -1125.14 ml   Filed Weights   02/09/24 1929 02/09/24 2250  Weight: 52.2 kg 49.9 kg    Data Reviewed: I have personally reviewed and interpreted daily labs, tele strips, imagings as discussed above. I reviewed all nursing notes, pharmacy notes, vitals, pertinent old records I have discussed plan of care as described above with RN and patient/family.  CBC: Recent Labs  Lab 02/09/24 1515 02/10/24 0615 02/11/24 0543 02/12/24 0523 02/12/24 1202  WBC 8.0 3.7* 3.8* 2.7*  2.7* 3.7*  NEUTROABS  --   --   --  2.0 2.9  HGB 11.7* 10.2* 10.4* 10.6*  10.5* 11.3*  HCT 36.0* 31.4* 31.1* 31.0*  31.4* 33.4*  MCV 97.6 98.1 95.1 93.4  94.6 93.8  PLT 92* 66* 49* 40*  40* 44*   Basic Metabolic Panel: Recent Labs  Lab 02/09/24 1515 02/10/24 0615 02/11/24 0543 02/12/24 0523  NA 137 138 138 140  K 3.0* 3.8 3.4* 3.6  CL 109 115* 114* 117*  CO2 19* 19* 18* 18*  GLUCOSE 109* 108* 117* 84  BUN 28* 24* 20 18  CREATININE 1.39* 1.16 1.11 1.11  CALCIUM 9.0 8.5* 8.5* 8.8*  MG 2.2  --  2.0 1.9  PHOS 1.8* 2.4* 2.2* 2.2*    Studies: No results found.   Scheduled Meds:  cholecalciferol  1,000 Units Oral Daily   ciprofloxacin  500 mg Oral BID   feeding supplement  237 mL Oral TID BM   folic acid  1 mg Oral Daily   loratadine  10 mg Oral Daily   methotrexate  15 mg Oral Weekly   multivitamin with minerals  1 tablet Oral Daily   phosphorus  500 mg Oral TID   rosuvastatin  20 mg Oral Daily   sodium bicarbonate  650 mg Oral TID   ticagrelor  90 mg Oral BID   Continuous Infusions:   PRN Meds: diphenhydrAMINE **AND** acetaminophen, acetaminophen, albuterol, dextrose, guaiFENesin, hydrALAZINE, nitroGLYCERIN, ondansetron (ZOFRAN) IV  Time spent: 55 minutes  Author: Gillis Santa. MD [facility] 02/12/2024 2:36 PM  To reach On-call, see care teams to locate the attending and reach out to them via [email or web address]. If 7PM-7AM, please contact  night-coverage If you still have difficulty reaching the attending provider, please page the [facility] (Director on Call) for [facility] on amion for assistance.

## 2024-02-12 NOTE — Consult Note (Signed)
 [facility]  [phone] [phone]  ID: Terry [name] [employer] OB: March 24, 1936  MR#: [id number]  AOZ#:[id number]  Patient Care Team: Wilford Corner, PA-C as PCP - General (Family Medicine) Royal Hawthorn, NP as Nurse Practitioner (Surgical Oncology) Viprakasit, Achilles Dunk, MD as Referring Physician (Urology) Inis Sizer, MD (Internal Medicine) Shea Evans Garry Heater, NP as Nurse Practitioner (Nurse Practitioner)  REFERRING PROVIDER: Dr. Lucianne [name]  REASON FOR REFERRAL: pancytopenia   ASSESSMENT AND PLAN:   Terry [name] is a 88 y.o. male with pmh of hypertension, hyperlipidemia, CAD status post stent placement, diastolic CHF, bladder cancer, bullous pemphigoid presented to [facility] ED on 02/09/2024 with altered mental status.  # Pancytopenia  - On admission, COVID PCR positive.  WBC 8.0, hemoglobin 11.7 and platelets of 92.  LDH 276.  Urine culture showed more than 10,000 colonies of Citrobacter.  Iron saturation 27 folate 7.5.  During the hospital course, his WBC has trended down to 2.7, hemoglobin 10.5 with platelets of 40.  -Patient has mild leukopenia with normal differential.  Has mild anemia hemoglobin 11.3 (not different from baseline).  Platelets have declined from 92 on admission to 44.  Will obtain APTT/PT/INR/fibrinogen.  Vitamin B12, folic acid, bilirubin LDH, reticulocyte, haptoglobin to assess for the etiology.  -His mild leukopenia and worsening thrombocytopenia could be related to bone marrow suppression from active COVID infection.  He is on methotrexate for bullous pemphigoid which can be placed on hold for now until his bone marrow recovers.  Also because methotrexate can cause drug-induced cytopenias.  -He is on Brilinta for CAD status stent placement.  Repeat platelets of 44.  Will need to be closely monitored and if platelets decline further may need cardiology input about continuation of the antiplatelets.  No  bleeding.    Patient expressed understanding and was in agreement with this plan. He also understands that He can call clinic at any time with any questions, concerns, or complaints.   I spent a total of 60 minutes reviewing chart data, face-to-face evaluation with the patient, counseling and coordination of care as detailed above.  HPI: Terry [name] is a 88 y.o. male with pmh of hypertension, hyperlipidemia, CAD status post stent placement, diastolic CHF, bladder cancer, bullous pemphigoid presented to [facility] ED on 02/09/2024 with altered mental status.  On admission, COVID PCR positive.  WBC 8.0, hemoglobin 11.7 and platelets of 92.  LDH 276.  Urine culture showed more than 10,000 colonies of Citrobacter.  Iron saturation 27 folate 7.5.  During the hospital course, his WBC has trended down to 2.7, hemoglobin 10.5 with platelets of 40.  I spoke with the patient's daughter.  Patient overall has been declining and getting weaker.  He was recently admitted in January for UTI.  On methotrexate for bullous pemphigoid On Brilinta with recent stent placement around September.  REVIEW OF SYSTEMS:   ROS  As per HPI. Otherwise, a complete review of systems is negative.  PAST MEDICAL HISTORY: Past Medical History:  Diagnosis Date   AKI (acute kidney injury) (HCC) 01/09/2024   Bullous pemphigoid    CAD (coronary artery disease)    Cancer (HCC)    bladder cancer   Hyperlipidemia    Hypertension    Ischemic cardiomyopathy    Kidney stones    Nephrolithiasis    Status post surgical removal and fulguration of bladder neoplasm     PAST SURGICAL HISTORY: Past Surgical History:  Procedure Laterality Date   BLADDER SURGERY  06/2015  CORONARY/GRAFT ACUTE MI REVASCULARIZATION N/A 08/28/2023   Procedure: Coronary/Graft Acute MI Revascularization;  Surgeon: Yates Decamp, MD;  Location: [facility] INVASIVE CV LAB;  Service: Cardiovascular;  Laterality: N/A;   deviated nose septum surgery  1970   EP  IMPLANTABLE DEVICE     HERNIA REPAIR  1980   single septum port     stomach ulcer surgery  1994   vascular stent  2006    FAMILY HISTORY: Family History  Problem Relation Age of Onset   Diabetes Mother    Heart disease Mother    Hypertension Father    Alzheimer's disease Father    Hip fracture Father    Prostate cancer Paternal Uncle     HEALTH MAINTENANCE: Social History   Tobacco Use   Smoking status: Former    Current packs/day: 0.00    Average packs/day: 1.5 packs/day for 50.0 years (75.0 ttl pk-yrs)    Types: Cigarettes    Start date: 06/19/1955    Quit date: 06/18/2005    Years since quitting: 18.6   Smokeless tobacco: Former    Types: Associate Professor status: Never Used  Substance Use Topics   Alcohol use: No    Alcohol/week: 0.0 standard drinks of alcohol   Drug use: No     Allergies  Allergen Reactions   Triprolidine-Pseudoephedrine Rash and Hives   Triprolidine-Pseudoephedrine Hives and Rash   Dapsone Other (See Comments)    Methemoglobinemia   Other reaction(s): Unknown  Methemoglobinemia   Methemoglobinemia   Methemoglobinemia   Other reaction(s): Unknown  Methemoglobinemia   Sudafed Pe Cold-Cough  [Phenylephrine-Dm-Gg-Apap] Hives   Actifed Cold-Allergy  [Chlorpheniramine-Phenylephrine] Rash    Current Facility-Administered Medications  Medication Dose Route Frequency Provider Last Rate Last Admin   diphenhydrAMINE (BENADRYL) capsule 25 mg  25 mg Oral QHS PRN Lorretta Harp, MD       And   acetaminophen (TYLENOL) tablet 500 mg  500 mg Oral QHS PRN Lorretta Harp, MD       acetaminophen (TYLENOL) tablet 650 mg  650 mg Oral Q6H PRN Lorretta Harp, MD       albuterol (PROVENTIL) (2.5 MG/3ML) 0.083% nebulizer solution 2.5 mg  2.5 mg Nebulization Q4H PRN Lorretta Harp, MD       cholecalciferol (VITAMIN D3) 25 MCG (1000 UNIT) tablet 1,000 Units  1,000 Units Oral Daily Lorretta Harp, MD   1,000 Units at 02/11/24 0850   ciprofloxacin (CIPRO) tablet 500 mg  500  mg Oral BID Gillis Santa, MD       dextrose 50 % solution 50 mL  50 mL Intravenous PRN Lorretta Harp, MD       feeding supplement (ENSURE ENLIVE / ENSURE PLUS) liquid 237 mL  237 mL Oral TID BM Gillis Santa, MD   237 mL at 02/11/24 2035   folic acid (FOLVITE) tablet 1 mg  1 mg Oral Daily Lorretta Harp, MD   1 mg at 02/11/24 0850   guaiFENesin (ROBITUSSIN) 100 MG/5ML liquid 200 mg  200 mg Oral Q4H PRN Lorretta Harp, MD       hydrALAZINE (APRESOLINE) injection 5 mg  5 mg Intravenous Q2H PRN Lorretta Harp, MD       loratadine (CLARITIN) tablet 10 mg  10 mg Oral Daily Lorretta Harp, MD   10 mg at 02/11/24 0850   methotrexate (RHEUMATREX) tablet 15 mg  15 mg Oral Weekly Lorretta Harp, MD       multivitamin with minerals tablet 1 tablet  1  tablet Oral Daily Gillis Santa, MD   1 tablet at 02/11/24 0850   nitroGLYCERIN (NITROSTAT) SL tablet 0.4 mg  0.4 mg Sublingual Q5 Min x 3 PRN Lorretta Harp, MD       ondansetron [facility]) injection 4 mg  4 mg Intravenous Q8H PRN Lorretta Harp, MD       phosphorus (K PHOS NEUTRAL) tablet 500 mg  500 mg Oral TID Gillis Santa, MD       potassium chloride (KLOR-CON) packet 40 mEq  40 mEq Oral Once Gillis Santa, MD       rosuvastatin (CRESTOR) tablet 20 mg  20 mg Oral Daily Lorretta Harp, MD   20 mg at 02/11/24 0347   sodium bicarbonate tablet 650 mg  650 mg Oral TID Gillis Santa, MD       ticagrelor [facility]) tablet 90 mg  90 mg Oral BID Lorretta Harp, MD   90 mg at 02/11/24 2122    OBJECTIVE: Vitals:   02/11/24 2330 02/12/24 0822  BP: 124/78 136/76  Pulse: 95 89  Resp: 17 16  Temp: 97.7 F (36.5 C) 98.2 F (36.8 C)  SpO2: 99% 99%     Body mass index is 17.23 kg/m.      General: Well-developed, well-nourished, no acute distress. Eyes: Pink conjunctiva, anicteric sclera. HEENT: Normocephalic, moist mucous membranes, clear oropharnyx. Lungs: Clear to auscultation bilaterally. Heart: Regular rate and rhythm. No rubs, murmurs, or gallops. Abdomen: Soft, nontender, nondistended. No  organomegaly noted, normoactive bowel sounds. Musculoskeletal: No edema, cyanosis, or clubbing. Neuro: Alert, answering all questions appropriately. Cranial nerves grossly intact. Skin: No rashes or petechiae noted. Psych: Normal affect. Lymphatics: No cervical, calvicular, axillary or inguinal LAD.   LAB RESULTS:  Lab Results  Component Value Date   NA 140 02/12/2024   K 3.6 02/12/2024   CL 117 (H) 02/12/2024   CO2 18 (L) 02/12/2024   GLUCOSE 84 02/12/2024   BUN 18 02/12/2024   CREATININE 1.11 02/12/2024   CALCIUM 8.8 (L) 02/12/2024   PROT 4.8 (L) 01/11/2024   ALBUMIN 2.3 (L) 01/11/2024   AST 48 (H) 01/11/2024   ALT 38 01/11/2024   ALKPHOS 92 01/11/2024   BILITOT 1.0 01/11/2024   GFRNONAA >60 02/12/2024   GFRAA 62 11/12/2020    Lab Results  Component Value Date   WBC 2.7 (L) 02/12/2024   WBC 2.7 (L) 02/12/2024   NEUTROABS 2.0 02/12/2024   HGB 10.5 (L) 02/12/2024   HGB 10.6 (L) 02/12/2024   HCT 31.4 (L) 02/12/2024   HCT 31.0 (L) 02/12/2024   MCV 94.6 02/12/2024   MCV 93.4 02/12/2024   PLT 40 (L) 02/12/2024   PLT 40 (L) 02/12/2024    Lab Results  Component Value Date   TIBC 168 (L) 02/10/2024   IRONPCTSAT 27 02/10/2024     STUDIES: DG Chest Port 1 View Result Date: 02/09/2024 CLINICAL DATA:  Short of breath, weakness, confusion EXAM: PORTABLE CHEST 1 VIEW COMPARISON:  01/08/2024 FINDINGS: Single frontal view of the chest demonstrates a stable right chest wall port. The cardiac silhouette is unremarkable. Stable areas of scarring compatible with emphysema. No acute airspace disease, effusion, or pneumothorax. No acute bony abnormalities. IMPRESSION: 1. Findings consistent with background emphysema, stable. No acute airspace disease. Electronically Signed   By: Sharlet Salina M.D.   On: 02/09/2024 19:54   CT Head Wo Contrast Result Date: 02/09/2024 CLINICAL DATA:  Mental status change of unknown cause. Hypoglycemia. EXAM: CT HEAD WITHOUT CONTRAST TECHNIQUE:  Contiguous axial images  were obtained from the base of the skull through the vertex without intravenous contrast. RADIATION DOSE REDUCTION: This exam was performed according to the departmental dose-optimization program which includes automated exposure control, adjustment of the mA and/or kV according to patient size and/or use of iterative reconstruction technique. COMPARISON:  None Available. FINDINGS: Brain: Diffuse cerebral atrophy. Ventricular dilatation consistent with central atrophy. Low-attenuation changes in the deep white matter consistent with small vessel ischemia. No abnormal extra-axial fluid collections. No mass effect or midline shift. Gray-white matter junctions are distinct. Basal cisterns are not effaced. No acute intracranial hemorrhage. Vascular: No hyperdense vessel or unexpected calcification. Skull: Normal. Negative for fracture or focal lesion. Sinuses/Orbits: Mucosal thickening in the paranasal sinuses. No acute air-fluid levels. Mastoid air cells are clear. Other: None. IMPRESSION: No acute intracranial abnormalities. Chronic atrophy and small vessel ischemic changes. Electronically Signed   By: Burman Nieves M.D.   On: 02/09/2024 19:21    Michaelyn Barter, MD   02/12/2024 11:04 AM

## 2024-02-12 NOTE — Progress Notes (Signed)
 Occupational Therapy Treatment Patient Details Name: Terry [name] [facility] MRN: [id number] DOB: 09-19-1936 Today's Date: 02/12/2024   History of present illness Pt  is an 88 y.o. male who presents with altered mental status. Admitted for Covid 19, UTI, and acute metabolic encephalopathy. PMH of hypertension, hyperlipidemia, CAD, stent placement, diastolic CHF, bladder cancer, s/p of ileal conduit, bullous pemphigoid, kidney stone, CAD.   OT comments  Chart reviewed to date, pt greeted in bed, lethargic and oriented to self only. Tx session targeted improving functional activity tolerance in preparation for ADL performance. MAX A required for bed mobility, pt sits on edge of bed for 5 minutes, MAX A for all ADL tasks. Nurse notified of pt status. OT will continue to follow.       If plan is discharge home, recommend the following:  A little help with walking and/or transfers;A lot of help with bathing/dressing/bathroom;Assistance with cooking/housework;Assist for transportation;Help with stairs or ramp for entrance   Equipment Recommendations  BSC/3in1;Other (comment) (RW)    Recommendations for Other Services      Precautions / Restrictions Precautions Precautions: Fall Restrictions Weight Bearing Restrictions Per Provider Order: No       Mobility Bed Mobility Overal bed mobility: Needs Assistance Bed Mobility: Supine to Sit, Sit to Supine     Supine to sit: Max assist, HOB elevated Sit to supine: Max assist, HOB elevated        Transfers                   General transfer comment: lateral scoot up the bed to the R 1x with MAX A     Balance Overall balance assessment: Needs assistance Sitting-balance support: Feet supported Sitting balance-Leahy Scale: Fair                                     ADL either performed or assessed with clinical judgement   ADL Overall ADL's : Needs assistance/impaired Eating/Feeding: Minimal assistance;Sitting    Grooming: Maximal assistance;Sitting               Lower Body Dressing: Maximal assistance;Bed level                      Extremity/Trunk Assessment              Vision       Perception     Praxis     Communication Communication Communication: No apparent difficulties Factors Affecting Communication: Hearing impaired   Cognition Arousal: Lethargic, Alert Behavior During Therapy: WFL for tasks assessed/performed, Flat affect Cognition: Cognition impaired   Orientation impairments: Time, Situation, Place Awareness: Intellectual awareness impaired, Online awareness impaired Memory impairment (select all impairments): Short-term memory, Declarative long-term memory Attention impairment (select first level of impairment): Focused attention Executive functioning impairment (select all impairments): Problem solving, Organization, Initiation OT - Cognition Comments: oriented to self only, lethargic                 Following commands: Impaired Following commands impaired: Follows multi-step commands inconsistently, Follows one step commands with increased time      Cueing   Cueing Techniques: Verbal cues  Exercises Other Exercises Other Exercises: pt sat on edge of bed for approx 5 minutes then requests to return to bed    Shoulder Instructions       General Comments spo2 >90% on RA throughout, HR between 110-120 bpm  at rest/with mobility; Nurse notified    Pertinent Vitals/ Pain       Pain Assessment Pain Assessment: Faces Faces Pain Scale: Hurts a little bit Pain Location: generalized Pain Descriptors / Indicators: Discomfort Pain Intervention(s): Limited activity within patient's tolerance, Premedicated before session, Repositioned  Home Living Family/patient expects to be discharged to:: Private residence                                        Prior Functioning/Environment              Frequency  Min 1X/week         Progress Toward Goals  OT Goals(current goals can now be found in the care plan section)  Progress towards OT goals: Progressing toward goals  Acute Rehab OT Goals Time For Goal Achievement: 02/24/24  Plan      Co-evaluation                 AM-PAC OT "6 Clicks" Daily Activity     Outcome Measure   Help from another person eating meals?: A Little Help from another person taking care of personal grooming?: A Lot Help from another person toileting, which includes using toliet, bedpan, or urinal?: A Lot Help from another person bathing (including washing, rinsing, drying)?: A Lot Help from another person to put on and taking off regular upper body clothing?: A Lot Help from another person to put on and taking off regular lower body clothing?: A Lot 6 Click Score: 13    End of Session    OT Visit Diagnosis: Other abnormalities of gait and mobility (R26.89);Muscle weakness (generalized) (M62.81)   Activity Tolerance Patient limited by lethargy   Patient Left in bed;with call bell/phone within reach;with bed alarm set   Nurse Communication Mobility status;Other (comment) (lethargy)        Time: 0981-1914 OT Time Calculation (min): 13 min  Charges: OT General Charges $OT Visit: 1 Visit OT Treatments $Therapeutic Activity: 8-22 mins  Oleta Mouse, OTD OTR/L  02/12/24, 3:50 PM

## 2024-02-13 DIAGNOSIS — U071 COVID-19: Secondary | ICD-10-CM | POA: Diagnosis not present

## 2024-02-13 LAB — CBC WITH DIFFERENTIAL/PLATELET
Abs Immature Granulocytes: 0.05 10*3/uL (ref 0.00–0.07)
Basophils Absolute: 0 10*3/uL (ref 0.0–0.1)
Basophils Relative: 0 %
Eosinophils Absolute: 0 10*3/uL (ref 0.0–0.5)
Eosinophils Relative: 0 %
HCT: 30.7 % — ABNORMAL LOW (ref 39.0–52.0)
Hemoglobin: 10.5 g/dL — ABNORMAL LOW (ref 13.0–17.0)
Immature Granulocytes: 1 %
Lymphocytes Relative: 6 %
Lymphs Abs: 0.4 10*3/uL — ABNORMAL LOW (ref 0.7–4.0)
MCH: 32.3 pg (ref 26.0–34.0)
MCHC: 34.2 g/dL (ref 30.0–36.0)
MCV: 94.5 fL (ref 80.0–100.0)
Monocytes Absolute: 0.5 10*3/uL (ref 0.1–1.0)
Monocytes Relative: 7 %
Neutro Abs: 5.5 10*3/uL (ref 1.7–7.7)
Neutrophils Relative %: 86 %
Platelets: 36 10*3/uL — ABNORMAL LOW (ref 150–400)
RBC: 3.25 MIL/uL — ABNORMAL LOW (ref 4.22–5.81)
RDW: 17.6 % — ABNORMAL HIGH (ref 11.5–15.5)
WBC: 6.4 10*3/uL (ref 4.0–10.5)
nRBC: 0 % (ref 0.0–0.2)

## 2024-02-13 LAB — BASIC METABOLIC PANEL
Anion gap: 3 — ABNORMAL LOW (ref 5–15)
BUN: 24 mg/dL — ABNORMAL HIGH (ref 8–23)
CO2: 20 mmol/L — ABNORMAL LOW (ref 22–32)
Calcium: 8.9 mg/dL (ref 8.9–10.3)
Chloride: 121 mmol/L — ABNORMAL HIGH (ref 98–111)
Creatinine, Ser: 1.21 mg/dL (ref 0.61–1.24)
GFR, Estimated: 59 mL/min — ABNORMAL LOW (ref >60)
Glucose, Bld: 101 mg/dL — ABNORMAL HIGH (ref 70–99)
Potassium: 3.5 mmol/L (ref 3.5–5.1)
Sodium: 144 mmol/L (ref 135–145)

## 2024-02-13 LAB — GLUCOSE, CAPILLARY
Glucose-Capillary: 104 mg/dL — ABNORMAL HIGH (ref 70–99)
Glucose-Capillary: 77 mg/dL (ref 70–99)
Glucose-Capillary: 106 mg/dL — ABNORMAL HIGH (ref 70–99)
Glucose-Capillary: 97 mg/dL (ref 70–99)
Glucose-Capillary: 91 mg/dL (ref 70–99)

## 2024-02-13 LAB — MAGNESIUM: Magnesium: 1.8 mg/dL (ref 1.7–2.4)

## 2024-02-13 LAB — PHOSPHORUS: Phosphorus: 2.1 mg/dL — ABNORMAL LOW (ref 2.5–4.6)

## 2024-02-13 MED ORDER — POTASSIUM PHOSPHATES 15 MMOLE/5ML IV SOLN
30.0000 mmol | Freq: Once | INTRAVENOUS | Status: AC
  Administered 2024-02-13: 30 mmol via INTRAVENOUS
  Filled 2024-02-13: qty 10

## 2024-02-13 MED ORDER — TICAGRELOR 90 MG PO TABS
90.0000 mg | ORAL_TABLET | Freq: Two times a day (BID) | ORAL | Status: DC
  Administered 2024-02-14 (×2): 90 mg via ORAL
  Filled 2024-02-13 (×3): qty 1

## 2024-02-13 NOTE — Progress Notes (Signed)
 [facility] Progress Note  Patient: Terry [name] [employer]    ZOX:[id number]  DOA: 02/09/2024     Date of Service: the patient was seen and examined on 02/13/2024  Chief Complaint  Patient presents with   Weakness   Brief hospital course: Below reviewed from HPI  Terry [name] is a 88 y.o. male with medical history significant of hypertension, hyperlipidemia, CAD, stent placement, diastolic CHF, bladder cancer, s/p of ileal conduit, bullous pemphigoid, kidney stone, CAD, who presents with altered mental status.   Patient has AMS, and is unable to provide accurate medical history, therefore, most of the history is obtained by discussing the case with ED physician, per EMS report, and with the nursing staff.    Per report, patient has been confused in the past several days.  EMS reported that the patient has hypoglycemia with blood glucose initially of 63 and increased with oral glucose to 101.  Patient is reportedly had urinary tract infection since January and has been given antibiotic treatment.  Patient has a generalized weakness, poor appetite, and decreased oral intake.   When I saw patient on the floor, patient has mild dry cough, no respiratory distress, no active nausea, vomiting, diarrhea noted.  Does not seem to have abdominal pain or chest pain.  Patient is confused, knows his own name, not orientated to the place and time.  He moves all extremities.  No facial droop or slurred speech.   Data reviewed independently and ED Course: pt was found to have positive PCR for COVID, WBC 8.0, AKI with creatinine 1.39, BUN 28, GFR 50 (recent baseline creatinine 0.96 on 01/11/2024), positive UA (cloudy appearance, large amount of leukocyte, many bacteria, WBC> 50), temperature normal, blood pressure 122/56, heart rate of 103, RR 24, oxygen saturation 100% on room air.  CT of head is negative.  Chest x-ray negative for infiltration.  Patient is admitted to telemetry bed as inpatient.       EKG: I have personally reviewed.  Seem to be sinus rhythm with frequent PAC, QTc 432, LAD   Assessment and Plan:  COVID-19 virus infection: No oxygen desaturation.  Chest x-ray negative for infiltration.  No fever, clinically not septic. -Continue supportive care -Bronchodilators and prn Robitussin for cough   UTI (urinary tract infection) -s/p Rocephin d/c'd on 2/28 and started ciprofloxacin 500 mg p.o. twice daily for 7 days Ucx: Citrobacter freundii, resistant to ceftriaxone.  Sensitive to ciprofloxacin.   Pancytopenia, most likely due to COVID viral infection Thrombocytopenia: Platelet 92---66--49--40--36 -LDH 276, peripheral smear shows normal platelet morphology Monitor CBC Consulted with oncology, recommended to discontinue methotrexate and stop Brilinta if possible. Discussed with cardio, would not stop Brilinta at this time, awaiting for oncology to weigh again for the use of Brilinta in view of CAD s/p stents    Acute metabolic encephalopathy: CT head negative, possibly due to UTI and COVID viral infection -Continue fall precaution   Hypoglycemia -CBG check every 4 hours -As needed D50 -s/p IV fluid D5, changed to NS at 75 cc/h  Hypophosphatemia, Phos repleted. Hypokalemia: Potassium repleted Monitor electrolytes and replete as needed   Coronary artery disease: S/p of stent placement. -Brilinta, Crestor   Hypercholesteremia: on Crestor   Benign essential HTN: Patient not taking medications currently.   -IV hydralazine as needed   AKI (acute kidney injury) (HCC): Likely due to dehydration and UTI -IV fluid as above -Avoid using renal toxic medications Mild acidosis, started bicarbonate oral supplement    Protein-calorie malnutrition, severe:  Body weight 49.9 kg, BMI 17.23 -Ensure -Consult to nutrition   Abnormal EKG: EKG seems to be sinus rhythm with frequent PAC's,  -Repeat EKG for further clarification     Body mass index is 17.23 kg/m.  Nutrition  Problem: Severe Malnutrition Etiology: chronic illness (CHF) Interventions: Interventions: Ensure Enlive (each supplement provides 350kcal and 20 grams of protein), MVI, Liberalize Diet  Pressure Injury 02/09/24 Sacrum Medial (Active)  02/09/24 2332  Location: Sacrum  Location Orientation: Medial  Staging:   Wound Description (Comments):   Present on Admission: Yes  Dressing Type Foam - Lift dressing to assess site every shift 02/11/24 2000     Diet: Regular diet DVT Prophylaxis: SCD, pharmacological prophylaxis contraindicated due to thrombocytopenia    Advance goals of care discussion: DNR/DNI-limited  Family Communication: family was present at bedside, at the time of interview.  The pt provided permission to discuss medical plan with the family. Opportunity was given to ask question and all questions were answered satisfactorily.   Disposition:  Pt is from Home, admitted with AMS, UTI and COVID, thrombocytopenia and pancytopenia, which precludes a safe discharge. Discharge to SNF, when stable.  Subjective: No significant events overnight, feeling generalized weakness and fatigue, tired.  Denies any specific complaints.  Still has very low appetite, encouraged to increase oral diet.    Physical Exam: General: NAD, lying comfortably Appear in no distress, affect appropriate Eyes: PERRLA ENT: Oral Mucosa Clear, moist  Neck: no JVD,  Cardiovascular: S1 and S2 Present, no Murmur,  Respiratory: good respiratory effort, Bilateral Air entry equal and Decreased, no Crackles, no wheezes Abdomen: Bowel Sound present, Soft and no tenderness, urostomy intact Skin: no rashes Extremities: no Pedal edema, no calf tenderness Neurologic: without any new focal findings Gait not checked due to patient safety concerns  Vitals:   02/12/24 0822 02/12/24 1608 02/12/24 2100 02/13/24 0819  BP: 136/76 124/85 131/74 127/72  Pulse: 89 94 86 82  Resp: 16 16 17 16   Temp: 98.2 F (36.8 C) 98.6  F (37 C) 98.7 F (37.1 C) 97.6 F (36.4 C)  TempSrc: Oral Oral Oral   SpO2: 99% 95% 100% 98%  Weight:      Height:        Intake/Output Summary (Last 24 hours) at 02/13/2024 1608 Last data filed at 02/13/2024 0630 Gross per 24 hour  Intake 225 ml  Output 700 ml  Net -475 ml   Filed Weights   02/09/24 1929 02/09/24 2250  Weight: 52.2 kg 49.9 kg    Data Reviewed: I have personally reviewed and interpreted daily labs, tele strips, imagings as discussed above. I reviewed all nursing notes, pharmacy notes, vitals, pertinent old records I have discussed plan of care as described above with RN and patient/family.  CBC: Recent Labs  Lab 02/10/24 0615 02/11/24 0543 02/12/24 0523 02/12/24 1202 02/13/24 0654  WBC 3.7* 3.8* 2.7*  2.7* 3.7* 6.4  NEUTROABS  --   --  2.0 2.9 5.5  HGB 10.2* 10.4* 10.6*  10.5* 11.3* 10.5*  HCT 31.4* 31.1* 31.0*  31.4* 33.4* 30.7*  MCV 98.1 95.1 93.4  94.6 93.8 94.5  PLT 66* 49* 40*  40* 44* 36*   Basic Metabolic Panel: Recent Labs  Lab 02/09/24 1515 02/10/24 0615 02/11/24 0543 02/12/24 0523 02/13/24 0654  NA 137 138 138 140 144  K 3.0* 3.8 3.4* 3.6 3.5  CL 109 115* 114* 117* 121*  CO2 19* 19* 18* 18* 20*  GLUCOSE 109* 108* 117* 84 101*  BUN 28* 24* 20 18 24*  CREATININE 1.39* 1.16 1.11 1.11 1.21  CALCIUM 9.0 8.5* 8.5* 8.8* 8.9  MG 2.2  --  2.0 1.9 1.8  PHOS 1.8* 2.4* 2.2* 2.2* 2.1*    Studies: No results found.   Scheduled Meds:  cholecalciferol  1,000 Units Oral Daily   ciprofloxacin  500 mg Oral BID   feeding supplement  237 mL Oral TID BM   folic acid  1 mg Oral Daily   loratadine  10 mg Oral Daily   multivitamin with minerals  1 tablet Oral Daily   rosuvastatin  20 mg Oral Daily   sodium bicarbonate  650 mg Oral TID   [START ON 02/14/2024] ticagrelor  90 mg Oral BID   Continuous Infusions:  potassium PHOSPHATE IVPB (in mmol) 30 mmol (02/13/24 1102)    PRN Meds: diphenhydrAMINE **AND** acetaminophen, acetaminophen,  albuterol, dextrose, guaiFENesin, hydrALAZINE, nitroGLYCERIN, ondansetron (ZOFRAN) IV  Time spent: 55 minutes  Author: Gillis Santa. MD [facility] 02/13/2024 4:08 PM  To reach On-call, see care teams to locate the attending and reach out to them via [email or web address]. If 7PM-7AM, please contact night-coverage If you still have difficulty reaching the attending provider, please page the [facility] (Director on Call) for [facility] on amion for assistance.

## 2024-02-14 ENCOUNTER — Inpatient Hospital Stay

## 2024-02-14 DIAGNOSIS — U071 COVID-19: Secondary | ICD-10-CM | POA: Diagnosis not present

## 2024-02-14 LAB — BASIC METABOLIC PANEL
Anion gap: 6 (ref 5–15)
BUN: 25 mg/dL — ABNORMAL HIGH (ref 8–23)
CO2: 19 mmol/L — ABNORMAL LOW (ref 22–32)
Calcium: 8.7 mg/dL — ABNORMAL LOW (ref 8.9–10.3)
Chloride: 119 mmol/L — ABNORMAL HIGH (ref 98–111)
Creatinine, Ser: 1.28 mg/dL — ABNORMAL HIGH (ref 0.61–1.24)
GFR, Estimated: 55 mL/min — ABNORMAL LOW (ref >60)
Glucose, Bld: 87 mg/dL (ref 70–99)
Potassium: 3.5 mmol/L (ref 3.5–5.1)
Sodium: 144 mmol/L (ref 135–145)

## 2024-02-14 LAB — CBC WITH DIFFERENTIAL/PLATELET
Abs Immature Granulocytes: 0.04 10*3/uL (ref 0.00–0.07)
Basophils Absolute: 0 10*3/uL (ref 0.0–0.1)
Basophils Relative: 0 %
Eosinophils Absolute: 0 10*3/uL (ref 0.0–0.5)
Eosinophils Relative: 0 %
HCT: 27.9 % — ABNORMAL LOW (ref 39.0–52.0)
Hemoglobin: 9.4 g/dL — ABNORMAL LOW (ref 13.0–17.0)
Immature Granulocytes: 1 %
Lymphocytes Relative: 7 %
Lymphs Abs: 0.5 10*3/uL — ABNORMAL LOW (ref 0.7–4.0)
MCH: 31.5 pg (ref 26.0–34.0)
MCHC: 33.7 g/dL (ref 30.0–36.0)
MCV: 93.6 fL (ref 80.0–100.0)
Monocytes Absolute: 0.6 10*3/uL (ref 0.1–1.0)
Monocytes Relative: 8 %
Neutro Abs: 6.1 10*3/uL (ref 1.7–7.7)
Neutrophils Relative %: 84 %
Platelets: 37 10*3/uL — ABNORMAL LOW (ref 150–400)
RBC: 2.98 MIL/uL — ABNORMAL LOW (ref 4.22–5.81)
RDW: 17.4 % — ABNORMAL HIGH (ref 11.5–15.5)
WBC: 7.2 10*3/uL (ref 4.0–10.5)
nRBC: 0 % (ref 0.0–0.2)

## 2024-02-14 LAB — GLUCOSE, CAPILLARY
Glucose-Capillary: 93 mg/dL (ref 70–99)
Glucose-Capillary: 75 mg/dL (ref 70–99)
Glucose-Capillary: 105 mg/dL — ABNORMAL HIGH (ref 70–99)
Glucose-Capillary: 73 mg/dL (ref 70–99)
Glucose-Capillary: 122 mg/dL — ABNORMAL HIGH (ref 70–99)
Glucose-Capillary: 88 mg/dL (ref 70–99)

## 2024-02-14 LAB — MAGNESIUM: Magnesium: 1.8 mg/dL (ref 1.7–2.4)

## 2024-02-14 LAB — HAPTOGLOBIN: Haptoglobin: <10 mg/dL — ABNORMAL LOW (ref 38–329)

## 2024-02-14 LAB — PHOSPHORUS: Phosphorus: 2.9 mg/dL (ref 2.5–4.6)

## 2024-02-14 LAB — HEPARIN INDUCED PLATELET AB (HIT ANTIBODY): Heparin Induced Plt Ab: 0.091 {OD_unit} (ref 0.000–0.400)

## 2024-02-14 MED ORDER — LACTATED RINGERS IV SOLN: INTRAVENOUS | Status: AC

## 2024-02-14 MED ORDER — IOHEXOL 300 MG/ML  SOLN
75.0000 mL | Freq: Once | INTRAMUSCULAR | Status: AC | PRN
Start: 2024-02-14 — End: 2024-02-14
  Administered 2024-02-14: 75 mL via INTRAVENOUS

## 2024-02-14 MED ORDER — SODIUM BICARBONATE 8.4 % IV SOLN
50.0000 meq | Freq: Once | INTRAVENOUS | Status: AC
  Administered 2024-02-14: 50 meq via INTRAVENOUS
  Filled 2024-02-14: qty 50

## 2024-02-14 NOTE — Plan of Care (Signed)
  Problem: Education: Goal: Knowledge of risk factors and measures for prevention of condition will improve 02/14/2024 0307 by Evelena Asa, RN Outcome: Progressing 02/14/2024 0306 by Evelena Asa, RN Outcome: Progressing 02/14/2024 0306 by Evelena Asa, RN Outcome: Progressing   Problem: Coping: Goal: Psychosocial and spiritual needs will be supported 02/14/2024 0307 by Evelena Asa, RN Outcome: Progressing 02/14/2024 0306 by Evelena Asa, RN Outcome: Progressing 02/14/2024 0306 by Evelena Asa, RN Outcome: Progressing   Problem: Respiratory: Goal: Will maintain a patent airway 02/14/2024 0307 by Evelena Asa, RN Outcome: Progressing 02/14/2024 0306 by Evelena Asa, RN Outcome: Progressing 02/14/2024 0306 by Evelena Asa, RN Outcome: Progressing Goal: Complications related to the disease process, condition or treatment will be avoided or minimized 02/14/2024 0307 by Evelena Asa, RN Outcome: Progressing 02/14/2024 0306 by Evelena Asa, RN Outcome: Progressing 02/14/2024 0306 by Evelena Asa, RN Outcome: Progressing   Problem: Education: Goal: Knowledge of General Education information will improve Description: Including pain rating scale, medication(s)/side effects and non-pharmacologic comfort measures 02/14/2024 0307 by Evelena Asa, RN Outcome: Progressing 02/14/2024 0306 by Evelena Asa, RN Outcome: Progressing 02/14/2024 0306 by Evelena Asa, RN Outcome: Progressing   Problem: Clinical Measurements: Goal: Ability to maintain clinical measurements within normal limits will improve 02/14/2024 0307 by Evelena Asa, RN Outcome: Progressing 02/14/2024 0306 by Evelena Asa, RN Outcome: Progressing 02/14/2024 0306 by Evelena Asa, RN Outcome: Progressing Goal: Will remain free from infection 02/14/2024 0307 by Evelena Asa, RN Outcome: Progressing 02/14/2024 0306 by Evelena Asa, RN Outcome: Progressing 02/14/2024 0306 by Evelena Asa, RN Outcome:  Progressing Goal: Diagnostic test results will improve 02/14/2024 0307 by Evelena Asa, RN Outcome: Progressing 02/14/2024 0306 by Evelena Asa, RN Outcome: Progressing 02/14/2024 0306 by Evelena Asa, RN Outcome: Progressing Goal: Respiratory complications will improve 02/14/2024 0307 by Evelena Asa, RN Outcome: Progressing 02/14/2024 0306 by Evelena Asa, RN Outcome: Progressing 02/14/2024 0306 by Evelena Asa, RN Outcome: Progressing Goal: Cardiovascular complication will be avoided 02/14/2024 0307 by Evelena Asa, RN Outcome: Progressing 02/14/2024 0306 by Evelena Asa, RN Outcome: Progressing 02/14/2024 0306 by Evelena Asa, RN Outcome: Progressing   Problem: Activity: Goal: Risk for activity intolerance will decrease 02/14/2024 0307 by Evelena Asa, RN Outcome: Progressing 02/14/2024 0306 by Evelena Asa, RN Outcome: Progressing 02/14/2024 0306 by Evelena Asa, RN Outcome: Progressing   Problem: Nutrition: Goal: Adequate nutrition will be maintained 02/14/2024 0307 by Evelena Asa, RN Outcome: Progressing 02/14/2024 0306 by Evelena Asa, RN Outcome: Progressing 02/14/2024 0306 by Evelena Asa, RN Outcome: Progressing   Problem: Coping: Goal: Level of anxiety will decrease 02/14/2024 0307 by Evelena Asa, RN Outcome: Progressing 02/14/2024 0306 by Evelena Asa, RN Outcome: Progressing 02/14/2024 0306 by Evelena Asa, RN Outcome: Progressing   Problem: Pain Managment: Goal: General experience of comfort will improve and/or be controlled 02/14/2024 0307 by Evelena Asa, RN Outcome: Progressing 02/14/2024 0306 by Evelena Asa, RN Outcome: Progressing 02/14/2024 0306 by Evelena Asa, RN Outcome: Progressing   Problem: Safety: Goal: Ability to remain free from injury will improve 02/14/2024 0307 by Evelena Asa, RN Outcome: Progressing 02/14/2024 0306 by Evelena Asa, RN Outcome: Progressing 02/14/2024 0306 by Evelena Asa, RN Outcome: Progressing    Problem: Skin Integrity: Goal: Risk for impaired skin integrity will decrease 02/14/2024 0307 by Evelena Asa, RN Outcome: Progressing 02/14/2024 0306 by Evelena Asa, RN Outcome: Progressing 02/14/2024 0306 by Evelena Asa, RN Outcome: Progressing

## 2024-02-14 NOTE — Plan of Care (Signed)
  Problem: Education: Goal: Knowledge of risk factors and measures for prevention of condition will improve Outcome: Progressing   Problem: Coping: Goal: Psychosocial and spiritual needs will be supported Outcome: Progressing   Problem: Respiratory: Goal: Will maintain a patent airway Outcome: Progressing Goal: Complications related to the disease process, condition or treatment will be avoided or minimized Outcome: Progressing   Problem: Education: Goal: Knowledge of General Education information will improve Description: Including pain rating scale, medication(s)/side effects and non-pharmacologic comfort measures Outcome: Progressing   Problem: Health Behavior/Discharge Planning: Goal: Ability to manage health-related needs will improve Outcome: Progressing   Problem: Clinical Measurements: Goal: Ability to maintain clinical measurements within normal limits will improve Outcome: Progressing Goal: Will remain free from infection Outcome: Progressing Goal: Diagnostic test results will improve Outcome: Progressing Goal: Respiratory complications will improve Outcome: Progressing Goal: Cardiovascular complication will be avoided Outcome: Progressing   Problem: Activity: Goal: Risk for activity intolerance will decrease Outcome: Progressing   Problem: Nutrition: Goal: Adequate nutrition will be maintained Outcome: Progressing   Problem: Activity: Goal: Risk for activity intolerance will decrease Outcome: Progressing   Problem: Pain Managment: Goal: General experience of comfort will improve and/or be controlled Outcome: Progressing   Problem: Safety: Goal: Ability to remain free from injury will improve Outcome: Progressing   Problem: Skin Integrity: Goal: Risk for impaired skin integrity will decrease Outcome: Progressing

## 2024-02-14 NOTE — Progress Notes (Signed)
 [facility] Progress Note  Patient: Terry [name] [employer] [facility]    ZOX:[id number]  DOA: 02/09/2024     Date of Service: the patient was seen and examined on 02/14/2024  Chief Complaint  Patient presents with   Weakness   Brief hospital course: Below reviewed from HPI  Terry [name] is a 88 y.o. male with medical history significant of hypertension, hyperlipidemia, CAD, stent placement, diastolic CHF, bladder cancer, s/p of ileal conduit, bullous pemphigoid, kidney stone, CAD, who presents with altered mental status.   Patient has AMS, and is unable to provide accurate medical history, therefore, most of the history is obtained by discussing the case with ED physician, per EMS report, and with the nursing staff.    Per report, patient has been confused in the past several days.  EMS reported that the patient has hypoglycemia with blood glucose initially of 63 and increased with oral glucose to 101.  Patient is reportedly had urinary tract infection since January and has been given antibiotic treatment.  Patient has a generalized weakness, poor appetite, and decreased oral intake.   When I saw patient on the floor, patient has mild dry cough, no respiratory distress, no active nausea, vomiting, diarrhea noted.  Does not seem to have abdominal pain or chest pain.  Patient is confused, knows his own name, not orientated to the place and time.  He moves all extremities.  No facial droop or slurred speech.   Data reviewed independently and ED Course: pt was found to have positive PCR for COVID, WBC 8.0, AKI with creatinine 1.39, BUN 28, GFR 50 (recent baseline creatinine 0.96 on 01/11/2024), positive UA (cloudy appearance, large amount of leukocyte, many bacteria, WBC> 50), temperature normal, blood pressure 122/56, heart rate of 103, RR 24, oxygen saturation 100% on room air.  CT of head is negative.  Chest x-ray negative for infiltration.  Patient is admitted to telemetry bed as inpatient.       EKG: I have personally reviewed.  Seem to be sinus rhythm with frequent PAC, QTc 432, LAD   Assessment and Plan:  COVID-19 virus infection: No oxygen desaturation.  Chest x-ray negative for infiltration.  No fever, clinically not septic. -Continue supportive care -Bronchodilators and prn Robitussin for cough   UTI (urinary tract infection) -s/p Rocephin d/c'd on 2/28 and started ciprofloxacin 500 mg p.o. twice daily for 7 days Ucx: Citrobacter freundii, resistant to ceftriaxone.  Sensitive to ciprofloxacin.   Pancytopenia, most likely due to COVID viral infection Thrombocytopenia: Platelet 92---66--49--40--36 -LDH 276, peripheral smear shows normal platelet morphology Monitor CBC Consulted with oncology, recommended to discontinue methotrexate and stop Brilinta if possible. Discussed with cardio, would not stop Brilinta at this time, awaiting for oncology to weigh again for the use of Brilinta in view of CAD s/p stents    Bruise left lateral chest wall noticed on 3/2 Possible secondary to trauma versus thrombocytopenia Follow CT chest to rule out hematoma and significant internal bleeding    Acute metabolic encephalopathy: CT head negative, possibly due to UTI and COVID viral infection -Continue fall precaution   Hypoglycemia -CBG check every 4 hours -As needed D50 -s/p IV fluid D5, changed to NS at 75 cc/h  Hypophosphatemia, Phos repleted. Hypokalemia: Potassium repleted Monitor electrolytes and replete as needed   Coronary artery disease: S/p of stent placement. -Brilinta, Crestor   Hypercholesteremia: on Crestor   Benign essential HTN: Patient not taking medications currently.   -IV hydralazine as needed   AKI (acute kidney injury) (  HCC): Likely due to dehydration and UTI -IV fluid as above -Avoid using renal toxic medications Mild acidosis, started bicarbonate oral supplement    Protein-calorie malnutrition, severe: Body weight 49.9 kg, BMI  17.23 -Ensure -Consult to nutrition   Abnormal EKG: EKG seems to be sinus rhythm with frequent PAC's,  3/1 repeat EKG shows sinus rhythm, left axis deviation and septal infarct, SR replaced A-fib and a previous EKG   Goals of care discussed with family, patient is DNR/DNI Palliative care consulted for further evaluation, patient may qualify for hospice.  Family agreed for palliative and hospice evaluation      Body mass index is 17.23 kg/m.  Nutrition Problem: Severe Malnutrition Etiology: chronic illness (CHF) Interventions: Interventions: Ensure Enlive (each supplement provides 350kcal and 20 grams of protein), MVI, Liberalize Diet  Pressure Injury 02/09/24 Sacrum Medial (Active)  02/09/24 2332  Location: Sacrum  Location Orientation: Medial  Staging:   Wound Description (Comments):   Present on Admission: Yes  Dressing Type Foam - Lift dressing to assess site every shift 02/13/24 2035     Diet: Regular diet DVT Prophylaxis: SCD, pharmacological prophylaxis contraindicated due to thrombocytopenia    Advance goals of care discussion: DNR/DNI-limited  Family Communication: family was present at bedside, at the time of interview.  The pt provided permission to discuss medical plan with the family. Opportunity was given to ask question and all questions were answered satisfactorily.   Disposition:  Pt is from Home, admitted with AMS, UTI and COVID, thrombocytopenia and pancytopenia, which precludes a safe discharge. Discharge to SNF when stable vs hospice if no improvement  Subjective: No significant events overnight, patient was lying comfortably without any acute distress but very obtunded and lethargic.  He still has no significant energy, very low appetite. Goals of care discussed with patient's family at bedside, agreed with palliative care and possible hospice placement if he qualifies. Condition is very critical and poor prognosis.  Physical Exam: General: NAD,  lying comfortably Appear in no distress, affect appropriate Eyes: PERRLA ENT: Oral Mucosa Clear, moist  Neck: no JVD,  Cardiovascular: S1 and S2 Present, no Murmur,  Respiratory: good respiratory effort, Bilateral Air entry equal and Decreased, no Crackles, no wheezes Abdomen: Bowel Sound present, Soft and no tenderness, urostomy intact Skin: no rashes Extremities: no Pedal edema, no calf tenderness Neurologic: without any new focal findings Gait not checked due to patient safety concerns  Vitals:   02/12/24 2100 02/13/24 0819 02/13/24 1633 02/14/24 0807  BP: 131/74 127/72 124/81 117/74  Pulse: 86 82 (!) 101 85  Resp: 17 16 18 16   Temp: 98.7 F (37.1 C) 97.6 F (36.4 C) 98.2 F (36.8 C) (!) 97.5 F (36.4 C)  TempSrc: Oral   Oral  SpO2: 100% 98% 100% 96%  Weight:      Height:        Intake/Output Summary (Last 24 hours) at 02/14/2024 1537 Last data filed at 02/14/2024 1409 Gross per 24 hour  Intake 498.05 ml  Output 1200 ml  Net -701.95 ml   Filed Weights   02/09/24 1929 02/09/24 2250  Weight: 52.2 kg 49.9 kg    Data Reviewed: I have personally reviewed and interpreted daily labs, tele strips, imagings as discussed above. I reviewed all nursing notes, pharmacy notes, vitals, pertinent old records I have discussed plan of care as described above with RN and patient/family.  CBC: Recent Labs  Lab 02/11/24 0543 02/12/24 0523 02/12/24 1202 02/13/24 0654 02/14/24 0526  WBC 3.8* 2.7*  2.7* 3.7* 6.4 7.2  NEUTROABS  --  2.0 2.9 5.5 6.1  HGB 10.4* 10.6*  10.5* 11.3* 10.5* 9.4*  HCT 31.1* 31.0*  31.4* 33.4* 30.7* 27.9*  MCV 95.1 93.4  94.6 93.8 94.5 93.6  PLT 49* 40*  40* 44* 36* 37*   Basic Metabolic Panel: Recent Labs  Lab 02/09/24 1515 02/10/24 0615 02/11/24 0543 02/12/24 0523 02/13/24 0654 02/14/24 0526  NA 137 138 138 140 144 144  K 3.0* 3.8 3.4* 3.6 3.5 3.5  CL 109 115* 114* 117* 121* 119*  CO2 19* 19* 18* 18* 20* 19*  GLUCOSE 109* 108* 117* 84  101* 87  BUN 28* 24* 20 18 24* 25*  CREATININE 1.39* 1.16 1.11 1.11 1.21 1.28*  CALCIUM 9.0 8.5* 8.5* 8.8* 8.9 8.7*  MG 2.2  --  2.0 1.9 1.8 1.8  PHOS 1.8* 2.4* 2.2* 2.2* 2.1* 2.9    Studies: No results found.   Scheduled Meds:  cholecalciferol  1,000 Units Oral Daily   ciprofloxacin  500 mg Oral BID   feeding supplement  237 mL Oral TID BM   folic acid  1 mg Oral Daily   loratadine  10 mg Oral Daily   multivitamin with minerals  1 tablet Oral Daily   rosuvastatin  20 mg Oral Daily   sodium bicarbonate  650 mg Oral TID   ticagrelor  90 mg Oral BID   Continuous Infusions:  lactated ringers 50 mL/hr at 02/14/24 1050    PRN Meds: diphenhydrAMINE **AND** acetaminophen, acetaminophen, albuterol, dextrose, guaiFENesin, hydrALAZINE, nitroGLYCERIN, ondansetron (ZOFRAN) IV  Time spent: 55 minutes  Author: Gillis Santa. MD [facility] 02/14/2024 3:37 PM  To reach On-call, see care teams to locate the attending and reach out to them via [email or web address]. If 7PM-7AM, please contact night-coverage If you still have difficulty reaching the attending provider, please page the [facility] (Director on Call) for [facility] on amion for assistance.

## 2024-02-15 ENCOUNTER — Inpatient Hospital Stay

## 2024-02-15 DIAGNOSIS — U071 COVID-19: Secondary | ICD-10-CM | POA: Diagnosis not present

## 2024-02-15 LAB — URINALYSIS, W/ REFLEX TO CULTURE (INFECTION SUSPECTED)
Bacteria, UA: NONE SEEN
RBC / HPF: >50 RBC/hpf (ref 0–5)
Squamous Epithelial / HPF: 0 /HPF (ref 0–5)

## 2024-02-15 LAB — BASIC METABOLIC PANEL
Anion gap: 5 (ref 5–15)
BUN: 28 mg/dL — ABNORMAL HIGH (ref 8–23)
CO2: 20 mmol/L — ABNORMAL LOW (ref 22–32)
Calcium: 9 mg/dL (ref 8.9–10.3)
Chloride: 117 mmol/L — ABNORMAL HIGH (ref 98–111)
Creatinine, Ser: 1.24 mg/dL (ref 0.61–1.24)
GFR, Estimated: 57 mL/min — ABNORMAL LOW (ref >60)
Glucose, Bld: 104 mg/dL — ABNORMAL HIGH (ref 70–99)
Potassium: 3.2 mmol/L — ABNORMAL LOW (ref 3.5–5.1)
Sodium: 142 mmol/L (ref 135–145)

## 2024-02-15 LAB — URINE CULTURE: Culture: >=100000 — AB

## 2024-02-15 LAB — CBC WITH DIFFERENTIAL/PLATELET
Abs Immature Granulocytes: 0.04 10*3/uL (ref 0.00–0.07)
Basophils Absolute: 0 10*3/uL (ref 0.0–0.1)
Basophils Relative: 0 %
Eosinophils Absolute: 0.3 10*3/uL (ref 0.0–0.5)
Eosinophils Relative: 4 %
HCT: 29.4 % — ABNORMAL LOW (ref 39.0–52.0)
Hemoglobin: 9.9 g/dL — ABNORMAL LOW (ref 13.0–17.0)
Immature Granulocytes: 1 %
Lymphocytes Relative: 8 %
Lymphs Abs: 0.5 10*3/uL — ABNORMAL LOW (ref 0.7–4.0)
MCH: 31.9 pg (ref 26.0–34.0)
MCHC: 33.7 g/dL (ref 30.0–36.0)
MCV: 94.8 fL (ref 80.0–100.0)
Monocytes Absolute: 0.7 10*3/uL (ref 0.1–1.0)
Monocytes Relative: 11 %
Neutro Abs: 4.7 10*3/uL (ref 1.7–7.7)
Neutrophils Relative %: 76 %
Platelets: 46 10*3/uL — ABNORMAL LOW (ref 150–400)
RBC: 3.1 MIL/uL — ABNORMAL LOW (ref 4.22–5.81)
RDW: 18 % — ABNORMAL HIGH (ref 11.5–15.5)
Smear Review: DECREASED
WBC: 6.1 10*3/uL (ref 4.0–10.5)
nRBC: 0 % (ref 0.0–0.2)

## 2024-02-15 LAB — GLUCOSE, CAPILLARY
Glucose-Capillary: 95 mg/dL (ref 70–99)
Glucose-Capillary: 91 mg/dL (ref 70–99)
Glucose-Capillary: 81 mg/dL (ref 70–99)
Glucose-Capillary: 50 mg/dL — ABNORMAL LOW (ref 70–99)
Glucose-Capillary: 65 mg/dL — ABNORMAL LOW (ref 70–99)
Glucose-Capillary: 78 mg/dL (ref 70–99)
Glucose-Capillary: 172 mg/dL — ABNORMAL HIGH (ref 70–99)
Glucose-Capillary: 105 mg/dL — ABNORMAL HIGH (ref 70–99)
Glucose-Capillary: 66 mg/dL — ABNORMAL LOW (ref 70–99)

## 2024-02-15 LAB — PHOSPHORUS: Phosphorus: 2.2 mg/dL — ABNORMAL LOW (ref 2.5–4.6)

## 2024-02-15 LAB — MAGNESIUM: Magnesium: 2.1 mg/dL (ref 1.7–2.4)

## 2024-02-15 MED ORDER — TRAZODONE HCL 50 MG PO TABS: 50.0000 mg | ORAL_TABLET | Freq: Once | ORAL | Status: DC

## 2024-02-15 MED ORDER — BISACODYL 5 MG PO TBEC
10.0000 mg | DELAYED_RELEASE_TABLET | Freq: Every day | ORAL | Status: DC
  Administered 2024-02-17: 10 mg via ORAL
  Filled 2024-02-15 (×2): qty 2

## 2024-02-15 MED ORDER — BISACODYL 5 MG PO TBEC
10.0000 mg | DELAYED_RELEASE_TABLET | Freq: Once | ORAL | Status: DC
  Filled 2024-02-15: qty 2

## 2024-02-15 MED ORDER — SODIUM BICARBONATE 8.4 % IV SOLN
50.0000 meq | Freq: Once | INTRAVENOUS | Status: AC
  Administered 2024-02-15: 50 meq via INTRAVENOUS
  Filled 2024-02-15: qty 50

## 2024-02-15 MED ORDER — ORAL CARE MOUTH RINSE: 15.0000 mL | OROMUCOSAL | Status: DC | PRN

## 2024-02-15 MED ORDER — TICAGRELOR 90 MG PO TABS
90.0000 mg | ORAL_TABLET | Freq: Two times a day (BID) | ORAL | Status: DC
  Filled 2024-02-15: qty 1

## 2024-02-15 MED ORDER — DEXTROSE 50 % IV SOLN
12.5000 g | INTRAVENOUS | Status: AC
  Administered 2024-02-15: 12.5 g via INTRAVENOUS
  Filled 2024-02-15: qty 50

## 2024-02-15 MED ORDER — SODIUM BICARBONATE 650 MG PO TABS
650.0000 mg | ORAL_TABLET | Freq: Three times a day (TID) | ORAL | Status: DC
  Filled 2024-02-15: qty 1

## 2024-02-15 MED ORDER — POTASSIUM PHOSPHATES 15 MMOLE/5ML IV SOLN
30.0000 mmol | Freq: Once | INTRAVENOUS | Status: AC
  Administered 2024-02-15: 30 mmol via INTRAVENOUS
  Filled 2024-02-15: qty 10

## 2024-02-15 MED ORDER — POTASSIUM CHLORIDE CRYS ER 20 MEQ PO TBCR
40.0000 meq | EXTENDED_RELEASE_TABLET | Freq: Once | ORAL | Status: AC
  Administered 2024-02-15: 40 meq via ORAL
  Filled 2024-02-15: qty 2

## 2024-02-15 MED ORDER — SODIUM CHLORIDE 0.9 % IV SOLN
500.0000 mg | INTRAVENOUS | Status: DC
  Administered 2024-02-15 – 2024-02-17 (×3): 500 mg via INTRAVENOUS
  Filled 2024-02-15 (×4): qty 5

## 2024-02-15 MED ORDER — POLYETHYLENE GLYCOL 3350 17 G PO PACK
17.0000 g | PACK | Freq: Two times a day (BID) | ORAL | Status: DC
  Administered 2024-02-15 – 2024-02-19 (×6): 17 g via ORAL
  Filled 2024-02-15 (×5): qty 1

## 2024-02-15 MED ORDER — BISACODYL 10 MG RE SUPP: 10.0000 mg | Freq: Every day | RECTAL | Status: DC | PRN

## 2024-02-15 NOTE — Progress Notes (Signed)
 [facility] Progress Note  Patient: Terry [name] [facility]    ZHY:[id number]  DOA: 02/09/2024     Date of Service: the patient was seen and examined on 02/15/2024  Chief Complaint  Patient presents with   Weakness   Brief hospital course: Below reviewed from HPI  Terry [name] is a 88 y.o. male with medical history significant of hypertension, hyperlipidemia, CAD, stent placement, diastolic CHF, bladder cancer, s/p of ileal conduit, bullous pemphigoid, kidney stone, CAD, who presents with altered mental status.   Patient has AMS, and is unable to provide accurate medical history, therefore, most of the history is obtained by discussing the case with ED physician, per EMS report, and with the nursing staff.    Per report, patient has been confused in the past several days.  EMS reported that the patient has hypoglycemia with blood glucose initially of 63 and increased with oral glucose to 101.  Patient is reportedly had urinary tract infection since January and has been given antibiotic treatment.  Patient has a generalized weakness, poor appetite, and decreased oral intake.   When I saw patient on the floor, patient has mild dry cough, no respiratory distress, no active nausea, vomiting, diarrhea noted.  Does not seem to have abdominal pain or chest pain.  Patient is confused, knows his own name, not orientated to the place and time.  He moves all extremities.  No facial droop or slurred speech.   Data reviewed independently and ED Course: pt was found to have positive PCR for COVID, WBC 8.0, AKI with creatinine 1.39, BUN 28, GFR 50 (recent baseline creatinine 0.96 on 01/11/2024), positive UA (cloudy appearance, large amount of leukocyte, many bacteria, WBC> 50), temperature normal, blood pressure 122/56, heart rate of 103, RR 24, oxygen saturation 100% on room air.  CT of head is negative.  Chest x-ray negative for infiltration.  Patient is admitted to telemetry bed as inpatient.       EKG: I have personally reviewed.  Seem to be sinus rhythm with frequent PAC, QTc 432, LAD   Assessment and Plan:  COVID-19 virus infection: No oxygen desaturation.  Chest x-ray negative for infiltration.  No fever, clinically not septic. -Continue supportive care -Bronchodilators and prn Robitussin for cough   UTI (urinary tract infection) -s/p Rocephin d/c'd on 2/28 and started ciprofloxacin 500 mg p.o. twice daily for 7 days Ucx: Citrobacter freundii, resistant to ceftriaxone.  Sensitive to ciprofloxacin. 3/3 hematuria double today.  Held Brilinta for now Monitor H&H US Renal: No obstruction, bilateral renal calculi  Pancytopenia, most likely due to COVID viral infection Thrombocytopenia: Platelet 92---66--49--40--36 -LDH 276, peripheral smear shows normal platelet morphology Monitor CBC Consulted with oncology, recommended to discontinue methotrexate and stop Brilinta if possible. Discussed with cardio, would not stop Brilinta at this time, awaiting for oncology to weigh again for the use of Brilinta in view of CAD s/p stents    Bruise left lateral chest wall noticed on 3/2 Possible secondary to trauma versus thrombocytopenia CT chest: Left moderate pleural effusion, no significant left lateral hematoma.  Ascites and renal calculi.   Acute metabolic encephalopathy: CT head negative, possibly due to UTI and COVID viral infection -Continue fall precaution   Hypoglycemia -CBG check every 4 hours -As needed D50 -s/p IV fluid D5, changed to NS at 75 cc/h  Hypophosphatemia, Phos repleted. Hypokalemia: Potassium repleted Monitor electrolytes and replete as needed   Coronary artery disease: S/p of stent placement. -Brilinta, Crestor   Hypercholesteremia: on Crestor  Benign essential HTN: Patient not taking medications currently.   -IV hydralazine as needed   AKI (acute kidney injury) (HCC): Likely due to dehydration and UTI -IV fluid as above -Avoid using renal toxic  medications Mild acidosis, started bicarbonate oral supplement    Protein-calorie malnutrition, severe: Body weight 49.9 kg, BMI 17.23 -Ensure -Consult to nutrition   Abnormal EKG: EKG seems to be sinus rhythm with frequent PAC's,  3/1 repeat EKG shows sinus rhythm, left axis deviation and septal infarct, SR replaced A-fib and a previous EKG   Goals of care discussed with family, patient is DNR/DNI Palliative care consulted for further evaluation, patient may qualify for hospice.  Family agreed for palliative and hospice evaluation      Body mass index is 17.23 kg/m.  Nutrition Problem: Severe Malnutrition Etiology: chronic illness (CHF) Interventions: Interventions: Ensure Enlive (each supplement provides 350kcal and 20 grams of protein), MVI, Liberalize Diet  Pressure Injury 02/09/24 Sacrum Medial (Active)  02/09/24 2332  Location: Sacrum  Location Orientation: Medial  Staging:   Wound Description (Comments):   Present on Admission: Yes  Dressing Type Foam - Lift dressing to assess site every shift 02/14/24 2000     Diet: Regular diet DVT Prophylaxis: SCD, pharmacological prophylaxis contraindicated due to thrombocytopenia    Advance goals of care discussion: DNR/DNI-limited  Family Communication: family was present at bedside, at the time of interview.  The pt provided permission to discuss medical plan with the family. Opportunity was given to ask question and all questions were answered satisfactorily.   Disposition:  Pt is from Home, admitted with AMS, UTI and COVID, thrombocytopenia and pancytopenia, which precludes a safe discharge. Discharge to SNF when stable vs hospice if no improvement  Subjective: No significant events overnight, patient seems very lethargic and tired laying comfortably in the bed.  Unable to offer any complaints. Patient refused morning meds as per RN Family was at bedside, goals of care discussed.  He agreed for hospice evaluation,  currently they want to continue medications until hospice evaluation will be done.  Physical Exam: General: NAD but seems very lethargic and weak, cachectic Appear in no distress, affect depressed and hopeless Eyes: PERRLA ENT: Oral Mucosa Clear, moist  Neck: no JVD,  Cardiovascular: S1 and S2 Present, no Murmur,  Respiratory: Equal air entry bilaterally, mild bibasilar crackles, no wheezes.  Left lateral chest wall bruise Abdomen: Bowel Sound present, Soft and no tenderness, urostomy intact Skin: Left lateral chest wall bruise.  Extremities: No Pedal edema, no calf tenderness Neurologic: without any new focal findings Gait not checked due to patient safety concerns  Vitals:   02/14/24 1652 02/14/24 2011 02/15/24 0545 02/15/24 0836  BP: 119/70 120/72 118/67 (!) 114/59  Pulse: 100 (!) 105 82 73  Resp: 16 17 17 16   Temp: 97.7 F (36.5 C) 97.8 F (36.6 C) 98.4 F (36.9 C)   TempSrc: Oral Oral Oral   SpO2: 98% 97% 95% 95%  Weight:      Height:        Intake/Output Summary (Last 24 hours) at 02/15/2024 1620 Last data filed at 02/15/2024 1509 Gross per 24 hour  Intake 1417.83 ml  Output 1550 ml  Net -132.17 ml   Filed Weights   02/09/24 1929 02/09/24 2250  Weight: 52.2 kg 49.9 kg    Data Reviewed: I have personally reviewed and interpreted daily labs, tele strips, imagings as discussed above. I reviewed all nursing notes, pharmacy notes, vitals, pertinent old records I have discussed  plan of care as described above with RN and patient/family.  CBC: Recent Labs  Lab 02/12/24 0523 02/12/24 1202 02/13/24 0654 02/14/24 0526 02/15/24 0235  WBC 2.7*  2.7* 3.7* 6.4 7.2 6.1  NEUTROABS 2.0 2.9 5.5 6.1 4.7  HGB 10.6*  10.5* 11.3* 10.5* 9.4* 9.9*  HCT 31.0*  31.4* 33.4* 30.7* 27.9* 29.4*  MCV 93.4  94.6 93.8 94.5 93.6 94.8  PLT 40*  40* 44* 36* 37* 46*   Basic Metabolic Panel: Recent Labs  Lab 02/11/24 0543 02/12/24 0523 02/13/24 0654 02/14/24 0526 02/15/24 0235   NA 138 140 144 144 142  K 3.4* 3.6 3.5 3.5 3.2*  CL 114* 117* 121* 119* 117*  CO2 18* 18* 20* 19* 20*  GLUCOSE 117* 84 101* 87 104*  BUN 20 18 24* 25* 28*  CREATININE 1.11 1.11 1.21 1.28* 1.24  CALCIUM 8.5* 8.8* 8.9 8.7* 9.0  MG 2.0 1.9 1.8 1.8 2.1  PHOS 2.2* 2.2* 2.1* 2.9 2.2*    Studies: US RENAL Result Date: 02/15/2024 CLINICAL DATA:  Hematuria EXAM: RENAL / URINARY TRACT ULTRASOUND COMPLETE COMPARISON:  Renal ultrasound 2016 FINDINGS: Right Kidney: Renal measurements: 9.5 x 4.6 x 4.7 cm = volume: 108 mL. Echogenicity within normal limits. No mass or hydronephrosis visualized. There is a echogenic shadowing area along the lower pole of the right kidney consistent with a potential stone measuring 16 mm. Left Kidney: Renal measurements: 9.4 x 4.2 x 4.0 cm = volume: 82 mL. Mild atrophy. Slight collecting system ectasia. Shadowing stones as well left kidney measuring up to 16 mm. Bladder: Poorly seen.  This could be contracted. Other: Ascites identified. IMPRESSION: Mild left-sided renal collecting system dilatation of uncertain etiology. The bladder is also poorly seen and could be contracted. Bilateral renal stones are seen. Further workup with CT as clinically appropriate. Ascites. Electronically Signed   By: Karen Kays M.D.   On: 02/15/2024 14:44     Scheduled Meds:  bisacodyl  10 mg Oral QHS   bisacodyl  10 mg Oral Once   cholecalciferol  1,000 Units Oral Daily   ciprofloxacin  500 mg Oral BID   feeding supplement  237 mL Oral TID BM   folic acid  1 mg Oral Daily   loratadine  10 mg Oral Daily   multivitamin with minerals  1 tablet Oral Daily   polyethylene glycol  17 g Oral BID   rosuvastatin  20 mg Oral Daily   [START ON 02/16/2024] ticagrelor  90 mg Oral BID   Continuous Infusions:  azithromycin 500 mg (02/15/24 1253)   potassium PHOSPHATE IVPB (in mmol) 30 mmol (02/15/24 1247)    PRN Meds: diphenhydrAMINE **AND** acetaminophen, acetaminophen, albuterol, bisacodyl,  dextrose, guaiFENesin, hydrALAZINE, nitroGLYCERIN, ondansetron (ZOFRAN) IV, mouth rinse  Time spent: 40 minutes  Author: Gillis Santa. MD [facility] 02/15/2024 4:20 PM  To reach On-call, see care teams to locate the attending and reach out to them via [email or web address]. If 7PM-7AM, please contact night-coverage If you still have difficulty reaching the attending provider, please page the [facility] (Director on Call) for [facility] on amion for assistance.

## 2024-02-15 NOTE — Progress Notes (Signed)
 Occupational Therapy Treatment Patient Details Name: Anmol Fleck [facility] MRN: [id number] DOB: 03-May-1936 Today's Date: 02/15/2024   History of present illness Pt  is an 88 y.o. male who presents with altered mental status. Admitted for Covid 19, UTI, and acute metabolic encephalopathy. PMH of hypertension, hyperlipidemia, CAD, stent placement, diastolic CHF, bladder cancer, s/p of ileal conduit, bullous pemphigoid, kidney stone, CAD.   OT comments  Mr Watchman was seen for OT treatment on this date. Upon arrival to room pt in bed, agreeable to tx with encouragement from family at bedside. Pt requires MIN A bed mobility, good sitting balance however c/o L flank pain and feeling cold. Noted to have soiled sheets (per family requested bed change >2 hours ago). CGA + RW sit<>stand and side steps along bed, MAX A pericare standing with clean pad change provided. Pt making good progress toward goals, will continue to follow POC. Discharge recommendation remains appropriate.       If plan is discharge home, recommend the following:  A little help with walking and/or transfers;A lot of help with bathing/dressing/bathroom;Assistance with cooking/housework;Assist for transportation;Help with stairs or ramp for entrance   Equipment Recommendations  BSC/3in1;Other (comment) (RW)    Recommendations for Other Services      Precautions / Restrictions Precautions Precautions: Fall Recall of Precautions/Restrictions: Impaired Restrictions Weight Bearing Restrictions Per Provider Order: No       Mobility Bed Mobility Overal bed mobility: Needs Assistance Bed Mobility: Supine to Sit, Sit to Supine     Supine to sit: Min assist Sit to supine: Min assist        Transfers Overall transfer level: Needs assistance   Transfers: Sit to/from Stand, Bed to chair/wheelchair/BSC Sit to Stand: Contact guard assist, From elevated surface     Step pivot transfers: Contact guard assist            Balance Overall balance assessment: Needs assistance Sitting-balance support: Feet supported Sitting balance-Leahy Scale: Good     Standing balance support: Bilateral upper extremity supported, During functional activity, Reliant on assistive device for balance Standing balance-Leahy Scale: Fair                             ADL either performed or assessed with clinical judgement   ADL Overall ADL's : Needs assistance/impaired                                       General ADL Comments: CGA simulated BSC t/f, MAX A Pericare standing.     Communication Communication Communication: Impaired Factors Affecting Communication: Hearing impaired   Cognition Arousal: Alert Behavior During Therapy: Flat affect Cognition: Cognition impaired                               Following commands: Impaired Following commands impaired: Follows multi-step commands inconsistently, Follows one step commands with increased time      Cueing      Exercises      Shoulder Instructions       General Comments spo2 >90% on RA throughout    Pertinent Vitals/ Pain       Pain Assessment Pain Assessment: Faces Faces Pain Scale: Hurts little more Pain Location: L flank Pain Descriptors / Indicators: Discomfort Pain Intervention(s): Limited activity within patient's tolerance, Repositioned   Frequency  Min 1X/week        Progress Toward Goals  OT Goals(current goals can now be found in the care plan section)  Progress towards OT goals: Progressing toward goals  Acute Rehab OT Goals OT Goal Formulation: With patient Time For Goal Achievement: 02/24/24 Potential to Achieve Goals: Good ADL Goals Pt Will Perform Lower Body Bathing: with supervision;sit to/from stand;sitting/lateral leans Pt Will Perform Lower Body Dressing: with supervision;sitting/lateral leans;sit to/from stand Pt Will Transfer to Toilet: with modified  independence;ambulating;regular height toilet Pt Will Perform Toileting - Clothing Manipulation and hygiene: with modified independence;with supervision;sitting/lateral leans;sit to/from stand Additional ADL Goal #1: Pt will demo implementation of 1 learned ECS during ADL performance to prevent overexertion on 2/2 trials.  Plan      Co-evaluation                 AM-PAC OT "6 Clicks" Daily Activity     Outcome Measure   Help from another person eating meals?: None Help from another person taking care of personal grooming?: A Little Help from another person toileting, which includes using toliet, bedpan, or urinal?: A Lot Help from another person bathing (including washing, rinsing, drying)?: A Lot Help from another person to put on and taking off regular upper body clothing?: A Little Help from another person to put on and taking off regular lower body clothing?: A Lot 6 Click Score: 16    End of Session Equipment Utilized During Treatment: Rolling walker (2 wheels)  OT Visit Diagnosis: Other abnormalities of gait and mobility (R26.89);Muscle weakness (generalized) (M62.81)   Activity Tolerance Patient tolerated treatment well;Patient limited by fatigue   Patient Left in bed;with call bell/phone within reach;with family/visitor present   Nurse Communication          Time: 9562-1308 OT Time Calculation (min): 23 min  Charges: OT General Charges $OT Visit: 1 Visit OT Treatments $Self Care/Home Management : 23-37 mins  Kathie Dike, M.S. OTR/L  02/15/24, 3:16 PM  ascom [phone]

## 2024-02-15 NOTE — Plan of Care (Signed)

## 2024-02-15 NOTE — Plan of Care (Signed)
   Problem: Education: Goal: Knowledge of General Education information will improve Description: Including pain rating scale, medication(s)/side effects and non-pharmacologic comfort measures Outcome: Progressing   Problem: Safety: Goal: Ability to remain free from injury will improve Outcome: Progressing

## 2024-02-15 NOTE — Progress Notes (Signed)
 Attempt was made to administer medications crushed in applesauce to patient but patient refused. Family gave encouragement to patient but patient refused again. MD notified.

## 2024-02-15 NOTE — Progress Notes (Signed)
 Physical Therapy Treatment Patient Details Name: Terry [name] [facility] MRN: [id number] DOB: 1936-01-02 Today's Date: 02/15/2024   History of Present Illness Pt  is an 88 y.o. male who presents with altered mental status. Admitted for Covid 19, UTI, and acute metabolic encephalopathy. PMH of hypertension, hyperlipidemia, CAD, stent placement, diastolic CHF, bladder cancer, s/p of ileal conduit, bullous pemphigoid, kidney stone, CAD.    PT Comments  Pt progressing with decreased assist needed for mobility.  Pt performed bed mobility with supervision and tranfers and gait with RW (20') with CGA.  Pt's mobility continues to be limited; pt demonstrating fatigue and anxiety and distraction (perseverating on wanting to leave), requiring cues for encouragement to participate.  Continued PT will assist pt towards increasing activity tolerance, functional standing balance, and LE strengthening for greater safety and independence with mobility.   If plan is discharge home, recommend the following: A little help with walking and/or transfers;A little help with bathing/dressing/bathroom;Assistance with cooking/housework;Direct supervision/assist for medications management;Assist for transportation;Help with stairs or ramp for entrance;Supervision due to cognitive status   Can travel by private vehicle        Equipment Recommendations  Rolling walker (2 wheels)    Recommendations for Other Services       Precautions / Restrictions Precautions Precautions: Fall Restrictions Weight Bearing Restrictions Per Provider Order: No     Mobility  Bed Mobility Overal bed mobility: Needs Assistance Bed Mobility: Supine to Sit, Sit to Supine     Supine to sit: HOB elevated, Supervision, Used rails Sit to supine: Supervision, Used rails   General bed mobility comments: increased time needed for pt to follow through with task.    Transfers Overall transfer level: Needs assistance   Transfers: Sit  to/from Stand, Bed to chair/wheelchair/BSC Sit to Stand: Contact guard assist   Step pivot transfers: Contact guard assist            Ambulation/Gait Ambulation/Gait assistance: Contact guard assist Gait Distance (Feet): 20 Feet Assistive device: Rolling walker (2 wheels) Gait Pattern/deviations: Trunk flexed, Knee flexed in stance - right, Knee flexed in stance - left, Decreased stride length, Narrow base of support Gait velocity: slow     General Gait Details: Pt with very slow cadence, maintains UEs on walker, minimal unsteadiness, niece helped by standing ahead of pt to encourage him to participate; pt perseverating on wanting to leave.   Stairs             Wheelchair Mobility     Tilt Bed    Modified Rankin (Stroke Patients Only)       Balance Overall balance assessment: Needs assistance Sitting-balance support: Feet supported Sitting balance-Leahy Scale: Good Sitting balance - Comments: dynamic sitting balance with intermittent UE support.   Standing balance support: Bilateral upper extremity supported, During functional activity, Reliant on assistive device for balance Standing balance-Leahy Scale: Fair                              Hotel manager: No apparent difficulties Factors Affecting Communication: Hearing impaired  Cognition Arousal: Alert Behavior During Therapy: Flat affect, Anxious (repeatedly saying he wanted to leave.)   PT - Cognitive impairments: History of cognitive impairments                       PT - Cognition Comments: per daughter he has been showing less and less safety and general awareness recently, pt fidgiting and  needing explicit, repeated, simple cueing for even basic tasks Following commands: Impaired Following commands impaired: Follows multi-step commands inconsistently, Follows one step commands with increased time    Cueing Cueing Techniques: Verbal cues, Visual cues   Exercises      General Comments General comments (skin integrity, edema, etc.): spo2 >90% on RA throughout      Pertinent Vitals/Pain Pain Assessment Pain Assessment: No/denies pain    Home Living                          Prior Function            PT Goals (current goals can now be found in the care plan section) Acute Rehab PT Goals Patient Stated Goal: pt unable, daughter wanting him to go to rehab PT Goal Formulation: With patient/family Time For Goal Achievement: 02/23/24 Potential to Achieve Goals: Fair Progress towards PT goals: Progressing toward goals    Frequency    Min 1X/week      PT Plan      Co-evaluation              AM-PAC PT "6 Clicks" Mobility   Outcome Measure  Help needed turning from your back to your side while in a flat bed without using bedrails?: A Little Help needed moving from lying on your back to sitting on the side of a flat bed without using bedrails?: A Little Help needed moving to and from a bed to a chair (including a wheelchair)?: A Little Help needed standing up from a chair using your arms (e.g., wheelchair or bedside chair)?: A Little Help needed to walk in hospital room?: A Little Help needed climbing 3-5 steps with a railing? : A Lot 6 Click Score: 17    End of Session Equipment Utilized During Treatment: Gait belt Activity Tolerance: Patient limited by fatigue Patient left: in bed;with call bell/phone within reach;with bed alarm set;with family/visitor present;with nursing/sitter in room Nurse Communication: Mobility status PT Visit Diagnosis: Unsteadiness on feet (R26.81);Muscle weakness (generalized) (M62.81);Difficulty in walking, not elsewhere classified (R26.2)     Time: 1610-9604 PT Time Calculation (min) (ACUTE ONLY): 24 min  Charges:    $Therapeutic Activity: 23-37 mins PT General Charges $$ ACUTE PT VISIT: 1 Visit                     Hortencia Conradi, PTA  02/15/24, 12:48 PM

## 2024-02-15 NOTE — TOC Progression Note (Signed)
 Transition of Care [facility]) - Progression Note    Patient Details  Name: Terry [name] MRN: [id number] Date of Birth: 10-Mar-1936  [facility]) CM/SW Contact  Marlowe Sax, RN Phone Number: 02/15/2024, 12:48 PM  Clinical Narrative:          Pending Plan Auth ID: [id number]   Expected Discharge Plan and Services                                               Social Determinants of Health (SDOH) Interventions SDOH Screenings   Food Insecurity: No Food Insecurity (02/09/2024)  Housing: Low Risk  (02/09/2024)  Transportation Needs: No Transportation Needs (02/09/2024)  Utilities: Not At Risk (02/09/2024)  Alcohol Screen: Low Risk  (08/26/2019)  Depression (PHQ2-9): Low Risk  (02/01/2020)  Financial Resource Strain: Low Risk  (08/27/2023)   Received from [facility]  Physical Activity: Inactive (02/01/2020)  Social Connections: Socially Isolated (02/09/2024)  Stress: No Stress Concern Present (02/01/2020)  Tobacco Use: Medium Risk (02/09/2024)    Readmission Risk Interventions     No data to display

## 2024-02-16 DIAGNOSIS — Z515 Encounter for palliative care: Secondary | ICD-10-CM | POA: Diagnosis not present

## 2024-02-16 DIAGNOSIS — G9341 Metabolic encephalopathy: Secondary | ICD-10-CM | POA: Diagnosis not present

## 2024-02-16 DIAGNOSIS — U071 COVID-19: Secondary | ICD-10-CM | POA: Diagnosis not present

## 2024-02-16 DIAGNOSIS — N390 Urinary tract infection, site not specified: Secondary | ICD-10-CM | POA: Diagnosis not present

## 2024-02-16 LAB — CBC WITH DIFFERENTIAL/PLATELET
Abs Immature Granulocytes: 0.03 10*3/uL (ref 0.00–0.07)
Basophils Absolute: 0 10*3/uL (ref 0.0–0.1)
Basophils Relative: 0 %
Eosinophils Absolute: 0.1 10*3/uL (ref 0.0–0.5)
Eosinophils Relative: 3 %
HCT: 25.2 % — ABNORMAL LOW (ref 39.0–52.0)
Hemoglobin: 8.7 g/dL — ABNORMAL LOW (ref 13.0–17.0)
Immature Granulocytes: 1 %
Lymphocytes Relative: 12 %
Lymphs Abs: 0.5 10*3/uL — ABNORMAL LOW (ref 0.7–4.0)
MCH: 31.6 pg (ref 26.0–34.0)
MCHC: 34.5 g/dL (ref 30.0–36.0)
MCV: 91.6 fL (ref 80.0–100.0)
Monocytes Absolute: 0.4 10*3/uL (ref 0.1–1.0)
Monocytes Relative: 10 %
Neutro Abs: 3.3 10*3/uL (ref 1.7–7.7)
Neutrophils Relative %: 74 %
Platelets: 61 10*3/uL — ABNORMAL LOW (ref 150–400)
RBC: 2.75 MIL/uL — ABNORMAL LOW (ref 4.22–5.81)
RDW: 17.7 % — ABNORMAL HIGH (ref 11.5–15.5)
WBC: 4.4 10*3/uL (ref 4.0–10.5)
nRBC: 0 % (ref 0.0–0.2)

## 2024-02-16 LAB — BASIC METABOLIC PANEL
Anion gap: 3 — ABNORMAL LOW (ref 5–15)
BUN: 24 mg/dL — ABNORMAL HIGH (ref 8–23)
CO2: 21 mmol/L — ABNORMAL LOW (ref 22–32)
Calcium: 8.4 mg/dL — ABNORMAL LOW (ref 8.9–10.3)
Chloride: 119 mmol/L — ABNORMAL HIGH (ref 98–111)
Creatinine, Ser: 1.14 mg/dL (ref 0.61–1.24)
GFR, Estimated: >60 mL/min (ref >60)
Glucose, Bld: 84 mg/dL (ref 70–99)
Potassium: 3.2 mmol/L — ABNORMAL LOW (ref 3.5–5.1)
Sodium: 143 mmol/L (ref 135–145)

## 2024-02-16 LAB — PHOSPHORUS: Phosphorus: 2.9 mg/dL (ref 2.5–4.6)

## 2024-02-16 LAB — GLUCOSE, CAPILLARY
Glucose-Capillary: 74 mg/dL (ref 70–99)
Glucose-Capillary: 77 mg/dL (ref 70–99)
Glucose-Capillary: 79 mg/dL (ref 70–99)
Glucose-Capillary: 84 mg/dL (ref 70–99)
Glucose-Capillary: 94 mg/dL (ref 70–99)
Glucose-Capillary: 96 mg/dL (ref 70–99)

## 2024-02-16 LAB — URINE CULTURE: Culture: <10000 — AB

## 2024-02-16 LAB — MAGNESIUM: Magnesium: 1.9 mg/dL (ref 1.7–2.4)

## 2024-02-16 MED ORDER — POTASSIUM CHLORIDE 10 MEQ/100ML IV SOLN
10.0000 meq | INTRAVENOUS | Status: AC
Start: 2024-02-16 — End: 2024-02-16
  Administered 2024-02-16 (×6): 10 meq via INTRAVENOUS
  Filled 2024-02-16 (×6): qty 100

## 2024-02-16 MED ORDER — CHLORHEXIDINE GLUCONATE CLOTH 2 % EX PADS
6.0000 | MEDICATED_PAD | Freq: Every day | CUTANEOUS | Status: DC
  Administered 2024-02-16 – 2024-02-19 (×3): 6 via TOPICAL

## 2024-02-16 MED ORDER — TICAGRELOR 90 MG PO TABS
90.0000 mg | ORAL_TABLET | Freq: Two times a day (BID) | ORAL | Status: DC
  Administered 2024-02-17 (×2): 90 mg via ORAL
  Filled 2024-02-16 (×5): qty 1

## 2024-02-16 MED ORDER — CIPROFLOXACIN IN D5W 400 MG/200ML IV SOLN
400.0000 mg | Freq: Two times a day (BID) | INTRAVENOUS | Status: DC
  Administered 2024-02-16 – 2024-02-18 (×6): 400 mg via INTRAVENOUS
  Filled 2024-02-16 (×7): qty 200

## 2024-02-16 NOTE — TOC Progression Note (Signed)
 Transition of Care [facility] - Progression Note    Patient Details  Name: Terry [name] MRN: [id number] Date of Birth: Nov 19, 1936  Transition of Care [facility] [place]) CM/SW Contact  Marlowe Sax, RN Phone Number: 02/16/2024, 8:48 AM  Clinical Narrative:     Ins pending       Expected Discharge Plan and Services                                               Social Determinants of Health (SDOH) Interventions SDOH Screenings   Food Insecurity: No Food Insecurity (02/09/2024)  Housing: Low Risk  (02/09/2024)  Transportation Needs: No Transportation Needs (02/09/2024)  Utilities: Not At Risk (02/09/2024)  Alcohol Screen: Low Risk  (08/26/2019)  Depression (PHQ2-9): Low Risk  (02/01/2020)  Financial Resource Strain: Low Risk  (08/27/2023)   Received from [facility]  Physical Activity: Inactive (02/01/2020)  Social Connections: Socially Isolated (02/09/2024)  Stress: No Stress Concern Present (02/01/2020)  Tobacco Use: Medium Risk (02/09/2024)    Readmission Risk Interventions     No data to display

## 2024-02-16 NOTE — Consult Note (Signed)
 Consultation Note Date: 02/16/2024 at 1000  Patient Name: Jove Beyl [facility]  DOB: [birth year]  MRN: [id number]  Age / Sex: 88 y.o., male  PCP: Wilford Corner, PA-C Referring Physician: Gillis Santa, MD  HPI/Patient Profile: 88 y.o. male  with past medical history of hypertension, hyperlipidemia, CAD, stent placement, diastolic CHF, bladder cancer, s/p of ileal conduit, bullous pemphigoid, kidney stone, CAD  admitted on 02/09/2024 with AMS.  Patient found to be COVID-positive.  He is being treated with bronchodilators and as needed Robitussin.  Additionally treated for UTI, pancytopenia, acute metabolic encephalopathy, hypoglycemia, hypophosphatemia, hypokalemia, AKI, protein malnutrition.  PMT was consulted to support patient and family goals of care discussions.   Clinical Assessment and Goals of Care: Extensive chart review completed prior to meeting patient including labs, vital signs, imaging, progress notes, orders, and available advanced directive documents from current and previous encounters. I then met with patient at bedside.  He is lethargic but appropriately responsive.  His Sister Darel Hong is at bedside.  As per attending, family has requested that medical updates and decisions be discussed directly with patient's daughter Pam.  Symptoms assessed.  Patient had no acute complaints.  No adjustment to [facility] needed at this time.  After visiting with the patient, I spoke with patient's daughter Rinaldo Cloud over the phone.  We agreed to meet bedside today.  Addendum: At 3:45 PM, I met with patient's daughter in hallway/outside of units 1A and 1C to discuss diagnosis prognosis, GOC, EOL wishes, disposition and options.  I introduced Palliative Medicine as specialized medical care for people living with serious illness. It focuses on providing relief from the symptoms and stress of a serious illness. The goal  is to improve quality of life for both the patient and the family.  We discussed a brief life review of the patient.  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  In retirement, he enjoyed golfing, softball, and being in the garden.  Daughter shares he was very active up until his heart attack late last year.  Since that time, she has seen a significant change in his efforts to remain active.  As far as functional and nutritional status endorses patient has not wanted to eat or drink anything for several weeks.  Despite her consistent encouragement at home, patient shares that he did not like the taste of water and refused even bites of food for several weeks PTA.  We discussed patient's current illness and what it means in the larger context of patient's on-going co-morbidities.  Natural disease trajectory and expectations at EOL were discussed.  Discussed that patient is not actively dying but that he is showing signs of transitioning to end-of-life.  Examples include poor functional status, not wanting to eat and drink (terminal anorexia), [employer] (speaking of deceased family members), and stating that he is tired and ready to "go home".  I attempted to elicit values and goals of care important to the patient.  Daughter shares she wants to support patient's wishes to not have  him suffer or be in pain.  She shares that she wants to make sure she is done everything to try to take care of him and feels that she has to date.  However, nothing seems to be helping him, including a stent in rehab, and she believes that continued aggressive medical treatments would not benefit him.  Quality versus quantity of life discussed.  The difference between aggressive medical intervention and comfort care was considered in light of the patient's goals of care.  Comfort measures discussed.  Hospice services, hospice philosophy, aging in place, and hospice inpatient unit requirements  reviewed.  Advance directives, concepts specific to code status, artificial feeding and hydration, and rehospitalization were considered and discussed.  Daughter wants to honor patient's wishes of DNR with limited interventions.  Education offered regarding concept specific to human mortality and the limitations of medical interventions to prolong life when the body begins to fail to thrive.  Discussed with daughter the importance of continued conversation with family and the medical providers regarding overall plan of care and treatment options, ensuring decisions are within the context of the patient's values and GOCs.  She would like to meet again with PMT alongside her sister and brother.  We have scheduled to meet in person tomorrow at 2 PM to continue goals of care discussions.  I am going to request hospice liaison be present for this meeting as well to answer any hospice related questions from the family.  No change to plan of care at this time.  Ongoing discussions to continue with in person meeting tomorrow, 3/5, at 2 PM.  Primary Decision Maker HCPOA  Physical Exam Vitals reviewed.  Constitutional:      General: He is not in acute distress.    Appearance: He is normal weight. He is ill-appearing.  HENT:     Head: Normocephalic.     Mouth/Throat:     Mouth: Mucous membranes are moist.  Eyes:     Pupils: Pupils are equal, round, and reactive to light.  Pulmonary:     Effort: Pulmonary effort is normal.  Abdominal:     Palpations: Abdomen is soft.  Skin:    General: Skin is warm and dry.     Coloration: Skin is pale.  Neurological:     Mental Status: He is alert.     Comments: Oriented to self  Psychiatric:        Mood and Affect: Mood normal.        Behavior: Behavior normal.     Palliative Assessment/Data: 40%     Thank you for this consult. Palliative medicine will continue to follow and assist holistically.   Time Total: 75 minutes  Time spent includes:  Detailed review of medical records (labs, imaging, vital signs), medically appropriate exam (mental status, respiratory, cardiac, skin), discussed with treatment team, counseling and educating patient, family and staff, documenting clinical information, medication management and coordination of care.  Signed by: Georgiann Cocker, DNP, FNP-BC Palliative Medicine   Please contact Palliative Medicine Team providers via [facility] for questions and concerns.

## 2024-02-16 NOTE — Progress Notes (Signed)
 Nutrition Follow-up  DOCUMENTATION CODES:   Severe malnutrition in context of chronic illness, Underweight  INTERVENTION:   -Continue regular diet -Continue MVI with minerals daily -Continue Ensure Enlive po TID, each supplement provides 350 kcal and 20 grams of protein  -Magic cup TID with meals, each supplement provides 290 kcal and 9 grams of protein  -Feeding assistance with meals  NUTRITION DIAGNOSIS:   Severe Malnutrition related to chronic illness (CHF) as evidenced by severe fat depletion, severe muscle depletion.  Ongoing  GOAL:   Patient will meet greater than or equal to 90% of their needs  Progressing   MONITOR:   PO intake, Supplement acceptance  REASON FOR ASSESSMENT:   Consult Assessment of nutrition requirement/status  ASSESSMENT:   Pt with medical history significant of hypertension, hyperlipidemia, CAD, stent placement, diastolic CHF, bladder cancer, s/p of ileal conduit, bullous pemphigoid, kidney stone, CAD, who presents with altered mental status.  Reviewed I/O's: -50 ml x 24 hours and -1.7 L since admission  UOP: 500 ml x 24 hours  Per nursing, pt requires a lot of encouragement to take medications and to eat. Pt is drinking Ensure supplements, but remains with very poor oral intake.   No new wt since last visit.   Per TOC notes, pt awaiting insurance authorization for SNF.   Medications reviewed and include dulcolax, vitamin D3, folic acid, cipro, and potassium chloride.   Labs reviewed: CBGS: 66-105 (inpatient orders for glycemic control are none).    Diet Order:   Diet Order             Diet regular Fluid consistency: Thin  Diet effective now                   EDUCATION NEEDS:   Education needs have been addressed  Skin:  Skin Assessment: Skin Integrity Issues: Skin Integrity Issues:: Other (Comment) Other: pressure injury to sacrum, open wound to lt lateral foot  Last BM:  02/15/24 (type 4)  Height:   Ht Readings  from Last 1 Encounters:  02/09/24 5\' 7"  (1.702 m)    Weight:   Wt Readings from Last 1 Encounters:  02/09/24 49.9 kg    Ideal Body Weight:  67.2 kg  BMI:  Body mass index is 17.23 kg/m.  Estimated Nutritional Needs:   Kcal:  1500-1700  Protein:  75-90 grams  Fluid:  > 1.5 L    Levada Schilling, RD, LDN, CDCES Registered Dietitian III Certified Diabetes Care and Education Specialist If unable to reach this RD, please use "RD Inpatient" group chat on secure chat between hours of 8am-4 pm daily

## 2024-02-16 NOTE — Progress Notes (Signed)
 [facility] Progress Note  Patient: Terry [name] [facility]    WGN:[id number]  DOA: 02/09/2024     Date of Service: the patient was seen and examined on 02/16/2024  Chief Complaint  Patient presents with   Weakness   Brief hospital course: Below reviewed from HPI  Terry [name] is a 88 y.o. male with medical history significant of hypertension, hyperlipidemia, CAD, stent placement, diastolic CHF, bladder cancer, s/p of ileal conduit, bullous pemphigoid, kidney stone, CAD, who presents with altered mental status.   Patient has AMS, and is unable to provide accurate medical history, therefore, most of the history is obtained by discussing the case with ED physician, per EMS report, and with the nursing staff.    Per report, patient has been confused in the past several days.  EMS reported that the patient has hypoglycemia with blood glucose initially of 63 and increased with oral glucose to 101.  Patient is reportedly had urinary tract infection since January and has been given antibiotic treatment.  Patient has a generalized weakness, poor appetite, and decreased oral intake.   When I saw patient on the floor, patient has mild dry cough, no respiratory distress, no active nausea, vomiting, diarrhea noted.  Does not seem to have abdominal pain or chest pain.  Patient is confused, knows his own name, not orientated to the place and time.  He moves all extremities.  No facial droop or slurred speech.   Data reviewed independently and ED Course: pt was found to have positive PCR for COVID, WBC 8.0, AKI with creatinine 1.39, BUN 28, GFR 50 (recent baseline creatinine 0.96 on 01/11/2024), positive UA (cloudy appearance, large amount of leukocyte, many bacteria, WBC> 50), temperature normal, blood pressure 122/56, heart rate of 103, RR 24, oxygen saturation 100% on room air.  CT of head is negative.  Chest x-ray negative for infiltration.  Patient is admitted to telemetry bed as inpatient.       EKG: I have personally reviewed.  Seem to be sinus rhythm with frequent PAC, QTc 432, LAD   Assessment and Plan:  COVID-19 virus infection: No oxygen desaturation.  Chest x-ray negative for infiltration.  No fever, clinically not septic. -Continue supportive care -Bronchodilators and prn Robitussin for cough   UTI (urinary tract infection) -s/p Rocephin d/c'd on 2/28 and started ciprofloxacin 500 mg twice daily for 7 days Ucx: Citrobacter freundii, resistant to ceftriaxone.  Sensitive to ciprofloxacin. 3/3 hematuria developed today.  Held [name] for 2 days, resume on 3/5 3/4 hematuria resolved today Monitor H&H US Renal: No obstruction, bilateral renal calculi  Pancytopenia, most likely due to COVID viral infection Thrombocytopenia: Platelet 92---66--49--40--36--61 -LDH 276, peripheral smear shows normal platelet morphology Monitor CBC Consulted with oncology, recommended to discontinue methotrexate and stop Brilinta if possible. Discussed with cardio, would not stop Brilinta at this time, awaiting for oncology to weigh again for the use of Brilinta in view of CAD s/p stents    Bruise left lateral chest wall noticed on 3/2 Possible secondary to trauma versus thrombocytopenia CT chest: Left moderate pleural effusion, no significant left lateral hematoma.  Ascites and renal calculi.   Acute metabolic encephalopathy: CT head negative, possibly due to UTI and COVID viral infection -Continue fall precaution   Hypoglycemia -CBG check every 4 hours -As needed D50 -s/p IV fluid D5, changed to NS at 75 cc/h  Hypophosphatemia, Phos repleted. Hypokalemia: Potassium repleted Monitor electrolytes and replete as needed   Coronary artery disease: S/p of stent placement. -Brilinta,  Crestor Held [place] for 2 days on 3/3 and 3/4 due to hematuria and thrombocytopenia  Hypercholesteremia: on Crestor   Benign essential HTN: Patient not taking medications currently.   -IV hydralazine  as needed   AKI (acute kidney injury) (HCC): Likely due to dehydration and UTI -s/p IV fluid as above -Avoid using renal toxic medications Mild acidosis, s/p bicarbonate oral supplement    Protein-calorie malnutrition, severe: Body weight 49.9 kg, BMI 17.23 -Ensure -Consult to nutrition   Abnormal EKG: EKG seems to be sinus rhythm with frequent PAC's,  3/1 repeat EKG shows sinus rhythm, left axis deviation and septal infarct, SR replaced A-fib and a previous EKG   Goals of care discussed with family, patient is DNR/DNI Palliative care consulted for further evaluation, patient may qualify for hospice.  Family agreed for palliative and hospice evaluation      Body mass index is 17.23 kg/m.  Nutrition Problem: Severe Malnutrition Etiology: chronic illness (CHF) Interventions: Interventions: Ensure Enlive (each supplement provides 350kcal and 20 grams of protein), MVI, Liberalize Diet  Pressure Injury 02/09/24 Sacrum Medial (Active)  02/09/24 2332  Location: Sacrum  Location Orientation: Medial  Staging:   Wound Description (Comments):   Present on Admission: Yes  Dressing Type Foam - Lift dressing to assess site every shift 02/15/24 2115     Diet: Regular diet DVT Prophylaxis: SCD, pharmacological prophylaxis contraindicated due to thrombocytopenia    Advance goals of care discussion: DNR/DNI-limited  Family Communication: family was present at bedside, at the time of interview.  The pt provided permission to discuss medical plan with the family. Opportunity was given to ask question and all questions were answered satisfactorily.   Disposition:  Pt is from Home, admitted with AMS, UTI and COVID, thrombocytopenia and pancytopenia, which precludes a safe discharge. Discharge to SNF when stable vs hospice if no improvement  Subjective: No significant events overnight, patient remained very weak and lethargic, decreased oral intake.  Denied any complaints.   Physical  Exam: General: NAD but seems very lethargic and weak, cachectic Appear in no distress, affect depressed  Eyes: PERRLA ENT: Oral Mucosa Clear, moist  Neck: no JVD,  Cardiovascular: S1 and S2 Present, no Murmur,  Respiratory: Equal air entry bilaterally, mild bibasilar crackles, no wheezes.  Left lateral chest wall bruise Abdomen: Bowel Sound present, Soft and no tenderness, urostomy intact Skin: Left lateral chest wall bruise.  Extremities: No Pedal edema, no calf tenderness Neurologic: without any new focal findings Gait not checked due to patient safety concerns  Vitals:   02/15/24 0836 02/15/24 1636 02/16/24 0000 02/16/24 0816  BP: (!) 114/59 124/75 127/74 107/62  Pulse: 73 94 87 82  Resp: 16 16 14 20   Temp:   97.9 F (36.6 C) (!) 97.2 F (36.2 C)  TempSrc:      SpO2: 95% 100% 98% 97%  Weight:      Height:       No intake or output data in the 24 hours ending 02/16/24 1509  Filed Weights   02/09/24 1929 02/09/24 2250  Weight: 52.2 kg 49.9 kg    Data Reviewed: I have personally reviewed and interpreted daily labs, tele strips, imagings as discussed above. I reviewed all nursing notes, pharmacy notes, vitals, pertinent old records I have discussed plan of care as described above with RN and patient/family.  CBC: Recent Labs  Lab 02/12/24 1202 02/13/24 0654 02/14/24 0526 02/15/24 0235 02/16/24 0556  WBC 3.7* 6.4 7.2 6.1 4.4  NEUTROABS 2.9 5.5 6.1  4.7 3.3  HGB 11.3* 10.5* 9.4* 9.9* 8.7*  HCT 33.4* 30.7* 27.9* 29.4* 25.2*  MCV 93.8 94.5 93.6 94.8 91.6  PLT 44* 36* 37* 46* 61*   Basic Metabolic Panel: Recent Labs  Lab 02/12/24 0523 02/13/24 0654 02/14/24 0526 02/15/24 0235 02/16/24 0556  NA 140 144 144 142 143  K 3.6 3.5 3.5 3.2* 3.2*  CL 117* 121* 119* 117* 119*  CO2 18* 20* 19* 20* 21*  GLUCOSE 84 101* 87 104* 84  BUN 18 24* 25* 28* 24*  CREATININE 1.11 1.21 1.28* 1.24 1.14  CALCIUM 8.8* 8.9 8.7* 9.0 8.4*  MG 1.9 1.8 1.8 2.1 1.9  PHOS 2.2* 2.1* 2.9  2.2* 2.9    Studies: No results found.    Scheduled Meds:  bisacodyl  10 mg Oral QHS   bisacodyl  10 mg Oral Once   Chlorhexidine Gluconate Cloth  6 each Topical Daily   cholecalciferol  1,000 Units Oral Daily   feeding supplement  237 mL Oral TID BM   folic acid  1 mg Oral Daily   loratadine  10 mg Oral Daily   multivitamin with minerals  1 tablet Oral Daily   polyethylene glycol  17 g Oral BID   rosuvastatin  20 mg Oral Daily   [START ON 02/17/2024] ticagrelor  90 mg Oral BID   traZODone  50 mg Oral Once   Continuous Infusions:  azithromycin 500 mg (02/16/24 1120)   ciprofloxacin 400 mg (02/16/24 1003)   potassium chloride 10 mEq (02/16/24 1403)    PRN Meds: diphenhydrAMINE **AND** acetaminophen, acetaminophen, albuterol, bisacodyl, dextrose, guaiFENesin, hydrALAZINE, nitroGLYCERIN, ondansetron (ZOFRAN) IV, mouth rinse  Time spent: 40 minutes  Author: Gillis [name]. MD [facility] 02/16/2024 3:09 PM  To reach On-call, see care teams to locate the attending and reach out to them via [email or web address]. If 7PM-7AM, please contact night-coverage If you still have difficulty reaching the attending provider, please page the [facility] (Director on Call) for [facility] on amion for assistance.

## 2024-02-16 NOTE — Consult Note (Signed)
 [facility] Liaison Note  02/16/2024  Terry [name] [facility] 12/07/1936 [id number]  Location: RN Hospital Liaison screened the patient remotely at [facility].  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 The patient was screened for 30 day readmission hospitalization with noted high risk score for unplanned readmission risk with 3 IP/1 ED in 6 months.  The patient was assessed for potential Care Management service needs for post hospital transition for care coordination. Review of patient's electronic medical record reveals patient was admitted COVID-19 virus infection. Pt recommended for SNF pending authorization. The facility will continue to address pt's ongoing needs post hospital discharge.  Plan: [facility] Liaison will continue to follow progress and disposition to asess for post hospital community care coordination/management needs.  Referral request for community care coordination: pending disposition.   VBCI Care Management/Population Health does not replace or interfere with any arrangements made by the Inpatient Transition of Care team.   For questions contact:   Elliot Cousin, RN, BSN Hospital Liaison [place]   [facility], Population Health Office Hours MTWF  8:00 am-6:00 pm Direct Dial: [phone] mobile [email or web address]

## 2024-02-16 NOTE — Progress Notes (Signed)
   02/16/24 1530  Spiritual Encounters  Type of Visit Initial  Care provided to: Family  Referral source Chaplain assessment  Reason for visit Routine spiritual support  OnCall Visit No  Interventions  Spiritual Care Interventions Made Compassionate presence  Intervention Outcomes  Outcomes Connection to spiritual care  Spiritual Care Plan  Spiritual Care Issues Still Outstanding No further spiritual care needs at this time (see row info)   Patients daughter was waiting on someone from Palliative Care and chaplain made sure the nurse knew where the daughter was in case the Palliative Care missed her in the hallway

## 2024-02-16 NOTE — Plan of Care (Signed)
  Problem: Education: Goal: Knowledge of risk factors and measures for prevention of condition will improve Outcome: Progressing   Problem: Clinical Measurements: Goal: Diagnostic test results will improve Outcome: Progressing   Problem: Safety: Goal: Ability to remain free from injury will improve Outcome: Progressing   Problem: Skin Integrity: Goal: Risk for impaired skin integrity will decrease Outcome: Progressing

## 2024-02-16 NOTE — Plan of Care (Signed)

## 2024-02-17 DIAGNOSIS — G9341 Metabolic encephalopathy: Secondary | ICD-10-CM | POA: Diagnosis not present

## 2024-02-17 DIAGNOSIS — U071 COVID-19: Secondary | ICD-10-CM | POA: Diagnosis not present

## 2024-02-17 DIAGNOSIS — N390 Urinary tract infection, site not specified: Secondary | ICD-10-CM | POA: Diagnosis not present

## 2024-02-17 DIAGNOSIS — E43 Unspecified severe protein-calorie malnutrition: Secondary | ICD-10-CM | POA: Diagnosis not present

## 2024-02-17 LAB — CBC WITH DIFFERENTIAL/PLATELET
Abs Immature Granulocytes: 0.04 10*3/uL (ref 0.00–0.07)
Basophils Absolute: 0 10*3/uL (ref 0.0–0.1)
Basophils Relative: 0 %
Eosinophils Absolute: 0.2 10*3/uL (ref 0.0–0.5)
Eosinophils Relative: 3 %
HCT: 27.2 % — ABNORMAL LOW (ref 39.0–52.0)
Hemoglobin: 9.1 g/dL — ABNORMAL LOW (ref 13.0–17.0)
Immature Granulocytes: 1 %
Lymphocytes Relative: 9 %
Lymphs Abs: 0.5 10*3/uL — ABNORMAL LOW (ref 0.7–4.0)
MCH: 32 pg (ref 26.0–34.0)
MCHC: 33.5 g/dL (ref 30.0–36.0)
MCV: 95.8 fL (ref 80.0–100.0)
Monocytes Absolute: 0.5 10*3/uL (ref 0.1–1.0)
Monocytes Relative: 10 %
Neutro Abs: 3.7 10*3/uL (ref 1.7–7.7)
Neutrophils Relative %: 77 %
Platelets: 67 10*3/uL — ABNORMAL LOW (ref 150–400)
RBC: 2.84 MIL/uL — ABNORMAL LOW (ref 4.22–5.81)
RDW: 18.3 % — ABNORMAL HIGH (ref 11.5–15.5)
WBC: 4.9 10*3/uL (ref 4.0–10.5)
nRBC: 0 % (ref 0.0–0.2)

## 2024-02-17 LAB — BASIC METABOLIC PANEL
Anion gap: 4 — ABNORMAL LOW (ref 5–15)
BUN: 23 mg/dL (ref 8–23)
CO2: 19 mmol/L — ABNORMAL LOW (ref 22–32)
Calcium: 8.5 mg/dL — ABNORMAL LOW (ref 8.9–10.3)
Chloride: 119 mmol/L — ABNORMAL HIGH (ref 98–111)
Creatinine, Ser: 1.18 mg/dL (ref 0.61–1.24)
GFR, Estimated: >60 mL/min (ref >60)
Glucose, Bld: 82 mg/dL (ref 70–99)
Potassium: 3.7 mmol/L (ref 3.5–5.1)
Sodium: 142 mmol/L (ref 135–145)

## 2024-02-17 LAB — GLUCOSE, CAPILLARY
Glucose-Capillary: 105 mg/dL — ABNORMAL HIGH (ref 70–99)
Glucose-Capillary: 74 mg/dL (ref 70–99)
Glucose-Capillary: 77 mg/dL (ref 70–99)
Glucose-Capillary: 87 mg/dL (ref 70–99)
Glucose-Capillary: 87 mg/dL (ref 70–99)
Glucose-Capillary: 93 mg/dL (ref 70–99)

## 2024-02-17 LAB — PHOSPHORUS: Phosphorus: 2.4 mg/dL — ABNORMAL LOW (ref 2.5–4.6)

## 2024-02-17 LAB — MAGNESIUM: Magnesium: 1.9 mg/dL (ref 1.7–2.4)

## 2024-02-17 MED ORDER — SODIUM BICARBONATE 8.4 % IV SOLN
100.0000 meq | Freq: Once | INTRAVENOUS | Status: AC
  Administered 2024-02-17: 100 meq via INTRAVENOUS
  Filled 2024-02-17 (×2): qty 100

## 2024-02-17 MED ORDER — POTASSIUM PHOSPHATES 15 MMOLE/5ML IV SOLN
15.0000 mmol | Freq: Once | INTRAVENOUS | Status: AC
  Administered 2024-02-17: 15 mmol via INTRAVENOUS
  Filled 2024-02-17: qty 5

## 2024-02-17 NOTE — TOC Progression Note (Signed)
 Transition of Care [facility]) - Progression Note    Patient Details  Name: Terry [name] MRN: [id number] Date of Birth: 07-23-1936  Transition of Care [facility]) CM/SW Contact  Marlowe Sax, RN Phone Number: 02/17/2024, 4:41 PM  Clinical Narrative:    Met with the patient's Family Arvella Merles is one of the main contacts her number is [phone] to discuss Disposition, I explained that the patient is not able to stay in the hospital for weeks until he passes.  I did explain that long term care in a facility is not covered by [employer]. It is a pay out of pocket expense unless he has Medicaid or long term care insurance. They stated he does not have either however; he has gotten the medicaid application in the mail, I explained that they need to complete that Immediately and make sure to send in all of the required documents with the application to not slow the process.  She (Pam) stated understanding I explained that the patient can go home with hospice to follow and we can make sure that they have all of the equipment needed, They stated that everyone works and that there wont be anyone at home to care for him, I asked if they could possibly take turns or maybe hire PCS to fill in when family can not be there, they stated that they can not afford to hire anyone and that they do not have the ability to take turns staying because everyone works the same time all day. They stated that FMLA is not an option because they would be without Pay from missing work and they can not afford that.  I explained that if he were to worsen then Hospice can always reevaluate for the hospice facility however we can not keep a patient in the hospital waiting for them to get into a facility, I explained Hospice can also help with symptom management  They asked if they were to not make him comfort care would that allow him to stay in the hospital, they asked specifically about IV ABX, they asked if they should continue  treatments to stay in the hospital, I explained that I am not telling them to do that, I explained that IV ABX will not keep a patient in the hospital, I explained people go home on IV ABX all the time, I explained that if they feel that he would be better comfort care and Hospice agrees then they should do that, I explained that continuing treatments just to get time in the hospital will not show medical necessity, I explained that to continue treatments of any kind,   the physician and Medicare will have to see that it is showing to be improving the condition or the treatment would not continue. I explained that Insurance will not cover a patient to stay in the hospital after medical necessity is no longer proven. I explained that once medical necessity is no longer shown the family will be responsible for paying for the hospital stay  I encouraged her again to get the Medicaid application in ASAP and we would keep trying to find options. I Encouraged the family to continue to try to think of options that can be done that is reasonable but is not staying in the hospital, I reiterated that patient's can not stay in the hospital for the only reason to be comfort care or to continue treatments that have shown not to be improving the condition and explained we need to  come up with a plan for DC.  Pam will complete the Medicaid application in the morning and get it turned in with all of the needed documents I spoke with [facility] leadership with the challenge to try to come up with a plan to help the family with the disposition The next step relies on the Medicaid application getting turned in to DSS I will touch base tomorrow with the family, I encouraged them to write down any questions or anything that needs clarified for them         Expected Discharge Plan and Services                                               Social Determinants of Health (SDOH) Interventions SDOH Screenings   Food  Insecurity: No Food Insecurity (02/09/2024)  Housing: Low Risk  (02/09/2024)  Transportation Needs: No Transportation Needs (02/09/2024)  Utilities: Not At Risk (02/09/2024)  Alcohol Screen: Low Risk  (08/26/2019)  Depression (PHQ2-9): Low Risk  (02/01/2020)  Financial Resource Strain: Low Risk  (08/27/2023)   Received from [facility]  Physical Activity: Inactive (02/01/2020)  Social Connections: Socially Isolated (02/09/2024)  Stress: No Stress Concern Present (02/01/2020)  Tobacco Use: Medium Risk (02/09/2024)    Readmission Risk Interventions     No data to display

## 2024-02-17 NOTE — Plan of Care (Signed)

## 2024-02-17 NOTE — Plan of Care (Signed)
  Problem: Coping: Goal: Psychosocial and spiritual needs will be supported Outcome: Progressing   Problem: Respiratory: Goal: Will maintain a patent airway Outcome: Progressing   Problem: Clinical Measurements: Goal: Diagnostic test results will improve Outcome: Progressing   Problem: Activity: Goal: Risk for activity intolerance will decrease Outcome: Progressing   Problem: Coping: Goal: Level of anxiety will decrease Outcome: Progressing   Problem: Elimination: Goal: Will not experience complications related to urinary retention Outcome: Progressing

## 2024-02-17 NOTE — Progress Notes (Signed)
 [facility] Progress Note  Patient: Terry [name]    ZHY:[id number]  DOA: 02/09/2024     Date of Service: the patient was seen and examined on 02/17/2024  Chief Complaint  Patient presents with   Weakness   Brief hospital course: Below reviewed from HPI  Terry [name] is a 88 y.o. male with medical history significant of hypertension, hyperlipidemia, CAD, stent placement, diastolic CHF, bladder cancer, s/p of ileal conduit, bullous pemphigoid, kidney stone, CAD, who presents with altered mental status.   Patient has AMS, and is unable to provide accurate medical history, therefore, most of the history is obtained by discussing the case with ED physician, per EMS report, and with the nursing staff.    Per report, patient has been confused in the past several days.  EMS reported that the patient has hypoglycemia with blood glucose initially of 63 and increased with oral glucose to 101.  Patient is reportedly had urinary tract infection since January and has been given antibiotic treatment.  Patient has a generalized weakness, poor appetite, and decreased oral intake.   When I saw patient on the floor, patient has mild dry cough, no respiratory distress, no active nausea, vomiting, diarrhea noted.  Does not seem to have abdominal pain or chest pain.  Patient is confused, knows his own name, not orientated to the place and time.  He moves all extremities.  No facial droop or slurred speech.   Data reviewed independently and ED Course: pt was found to have positive PCR for COVID, WBC 8.0, AKI with creatinine 1.39, BUN 28, GFR 50 (recent baseline creatinine 0.96 on 01/11/2024), positive UA (cloudy appearance, large amount of leukocyte, many bacteria, WBC> 50), temperature normal, blood pressure 122/56, heart rate of 103, RR 24, oxygen saturation 100% on room air.  CT of head is negative.  Chest x-ray negative for infiltration.  Patient is admitted to telemetry bed as inpatient.       EKG: I have personally reviewed.  Seem to be sinus rhythm with frequent PAC, QTc 432, LAD   Assessment and Plan:  COVID-19 virus infection: No oxygen desaturation.  Chest x-ray negative for infiltration.  No fever, clinically not septic. -Continue supportive care -Bronchodilators and prn Robitussin for cough   UTI (urinary tract infection) -s/p Rocephin d/c'd on 2/28 and started ciprofloxacin 500 mg twice daily for 7 days Ucx: Citrobacter freundii, resistant to ceftriaxone.  Sensitive to ciprofloxacin. 3/3 hematuria developed today.  Held [place] for 2 days, resume on 3/5 3/4 hematuria resolved today Monitor H&H US Renal: No obstruction, bilateral renal calculi  Pancytopenia, most likely due to COVID viral infection Thrombocytopenia: Platelet 92---66--49--40--36--61--67 -LDH 276, peripheral smear shows normal platelet morphology Monitor CBC Consulted with oncology, recommended to discontinue methotrexate and stop Brilinta if possible. Discussed with cardio, would not stop Brilinta at this time, awaiting for oncology to weigh again for the use of Brilinta in view of CAD s/p stents    Bruise left lateral chest wall noticed on 3/2 Possible secondary to trauma versus thrombocytopenia CT chest: Left moderate pleural effusion, no significant left lateral hematoma.  Ascites and renal calculi.   Acute metabolic encephalopathy: CT head negative, possibly due to UTI and COVID viral infection -Continue fall precaution   Hypoglycemia -CBG check every 4 hours -As needed D50 -s/p IV fluid D5, changed to NS at 75 cc/h  Hypophosphatemia, Phos repleted. Hypokalemia: Potassium repleted Monitor electrolytes and replete as needed   Coronary artery disease: S/p of stent placement. -Brilinta,  Crestor Held [place] for 2 days on 3/3 and 3/4 due to hematuria and thrombocytopenia  Hypercholesteremia: on Crestor   Benign essential HTN: Patient not taking medications currently.   -IV  hydralazine as needed   AKI (acute kidney injury) (HCC): Likely due to dehydration and UTI -s/p IV fluid as above -Avoid using renal toxic medications Mild acidosis, s/p bicarbonate oral supplement 3/5 bicarbonate 100 mEq IV push given   Protein-calorie malnutrition, severe: Body weight 49.9 kg, BMI 17.23 -Ensure -Consult to nutrition   Abnormal EKG: EKG seems to be sinus rhythm with frequent PAC's,  3/1 repeat EKG shows sinus rhythm, left axis deviation and septal infarct, SR replaced A-fib and a previous EKG   Goals of care discussed with family, patient is DNR/DNI Palliative care consulted for goals of care discussion if patient's condition does not improve.      Body mass index is 17.23 kg/m.  Nutrition Problem: Severe Malnutrition Etiology: chronic illness (CHF) Interventions: Interventions: Ensure Enlive (each supplement provides 350kcal and 20 grams of protein), MVI, Liberalize Diet  Pressure Injury 02/09/24 Sacrum Medial (Active)  02/09/24 2332  Location: Sacrum  Location Orientation: Medial  Staging:   Wound Description (Comments):   Present on Admission: Yes  Dressing Type Foam - Lift dressing to assess site every shift 02/16/24 0800     Diet: Regular diet DVT Prophylaxis: SCD, pharmacological prophylaxis contraindicated due to thrombocytopenia    Advance goals of care discussion: DNR/DNI-limited  Family Communication: family was present at bedside, at the time of interview.  The pt provided permission to discuss medical plan with the family. Opportunity was given to ask question and all questions were answered satisfactorily.  3/5 Repleted electrolytes, we will repeat labs tomorrow as family would like to continue to treat   Disposition:  Pt is from Home, admitted with AMS, UTI and COVID, thrombocytopenia and pancytopenia, which precludes a safe discharge. Discharge to SNF, when bed will be available.  3/5 peer to peer is done and patient got approved for  SNF placement.   Subjective: No significant events overnight, patient remained very weak and lethargic, decreased oral intake.  Denied any complaints.   Physical Exam: General: NAD but seems very lethargic and weak, cachectic Appear in no distress, affect depressed  Eyes: PERRLA ENT: Oral Mucosa Clear, moist  Neck: no JVD,  Cardiovascular: S1 and S2 Present, no Murmur,  Respiratory: Equal air entry bilaterally, mild bibasilar crackles, no wheezes.  Left lateral chest wall bruise Abdomen: Bowel Sound present, Soft and no tenderness, urostomy intact Skin: Left lateral chest wall bruise.  Extremities: No Pedal edema, no calf tenderness Neurologic: without any new focal findings Gait not checked due to patient safety concerns  Vitals:   02/16/24 0816 02/16/24 1605 02/16/24 2305 02/17/24 0807  BP: 107/62 121/84 111/68 118/77  Pulse: 82 96 81 93  Resp: 20 16 16 17   Temp: (!) 97.2 F (36.2 C) (!) 97.5 F (36.4 C) 98.8 F (37.1 C) (!) 97.5 F (36.4 C)  TempSrc:   Oral   SpO2: 97% 97% 94% 98%  Weight:      Height:        Intake/Output Summary (Last 24 hours) at 02/17/2024 1223 Last data filed at 02/17/2024 0900 Gross per 24 hour  Intake 300 ml  Output 1650 ml  Net -1350 ml    Filed Weights   02/09/24 1929 02/09/24 2250  Weight: 52.2 kg 49.9 kg    Data Reviewed: I have personally reviewed and interpreted daily  labs, tele strips, imagings as discussed above. I reviewed all nursing notes, pharmacy notes, vitals, pertinent old records I have discussed plan of care as described above with RN and patient/family.  CBC: Recent Labs  Lab 02/13/24 0654 02/14/24 0526 02/15/24 0235 02/16/24 0556 02/17/24 0445  WBC 6.4 7.2 6.1 4.4 4.9  NEUTROABS 5.5 6.1 4.7 3.3 3.7  HGB 10.5* 9.4* 9.9* 8.7* 9.1*  HCT 30.7* 27.9* 29.4* 25.2* 27.2*  MCV 94.5 93.6 94.8 91.6 95.8  PLT 36* 37* 46* 61* 67*   Basic Metabolic Panel: Recent Labs  Lab 02/13/24 0654 02/14/24 0526 02/15/24 0235  02/16/24 0556 02/17/24 0445  NA 144 144 142 143 142  K 3.5 3.5 3.2* 3.2* 3.7  CL 121* 119* 117* 119* 119*  CO2 20* 19* 20* 21* 19*  GLUCOSE 101* 87 104* 84 82  BUN 24* 25* 28* 24* 23  CREATININE 1.21 1.28* 1.24 1.14 1.18  CALCIUM 8.9 8.7* 9.0 8.4* 8.5*  MG 1.8 1.8 2.1 1.9 1.9  PHOS 2.1* 2.9 2.2* 2.9 2.4*    Studies: No results found.    Scheduled Meds:  bisacodyl  10 mg Oral QHS   bisacodyl  10 mg Oral Once   Chlorhexidine Gluconate Cloth  6 each Topical Daily   cholecalciferol  1,000 Units Oral Daily   feeding supplement  237 mL Oral TID BM   folic acid  1 mg Oral Daily   loratadine  10 mg Oral Daily   multivitamin with minerals  1 tablet Oral Daily   polyethylene glycol  17 g Oral BID   rosuvastatin  20 mg Oral Daily   ticagrelor  90 mg Oral BID   traZODone  50 mg Oral Once   Continuous Infusions:  azithromycin 500 mg (02/17/24 1107)   ciprofloxacin 400 mg (02/17/24 1018)   potassium PHOSPHATE IVPB (in mmol) 15 mmol (02/17/24 1107)    PRN Meds: diphenhydrAMINE **AND** acetaminophen, acetaminophen, albuterol, bisacodyl, dextrose, guaiFENesin, hydrALAZINE, nitroGLYCERIN, ondansetron (ZOFRAN) IV, mouth rinse  Time spent: 55 minutes  Author: Gillis Santa. MD [facility] 02/17/2024 12:23 PM  To reach On-call, see care teams to locate the attending and reach out to them via [email or web address]. If 7PM-7AM, please contact night-coverage If you still have difficulty reaching the attending provider, please page the [facility] (Director on Call) for [facility] on amion for assistance.

## 2024-02-17 NOTE — TOC Progression Note (Signed)
 Transition of Care [facility]) - Progression Note    Patient Details  Name: Terry [name] MRN: [id number] Date of Birth: January 05, 1936  Transition of Care [facility]) CM/SW Contact  Marlowe Sax, RN Phone Number: 02/17/2024, 9:54 AM  Clinical Narrative:     Family to have discussion with hospice today, if they decide against hospice Peer to Peer requested. call [phone] option 5. Deadline is 3.5.25 @ 1:30EST please provide pts name DOB: August 03, 1936 and plan ID [id number]. MD notified       Expected Discharge Plan and Services                                               Social Determinants of Health (SDOH) Interventions SDOH Screenings   Food Insecurity: No Food Insecurity (02/09/2024)  Housing: Low Risk  (02/09/2024)  Transportation Needs: No Transportation Needs (02/09/2024)  Utilities: Not At Risk (02/09/2024)  Alcohol Screen: Low Risk  (08/26/2019)  Depression (PHQ2-9): Low Risk  (02/01/2020)  Financial Resource Strain: Low Risk  (08/27/2023)   Received from [facility]  Physical Activity: Inactive (02/01/2020)  Social Connections: Socially Isolated (02/09/2024)  Stress: No Stress Concern Present (02/01/2020)  Tobacco Use: Medium Risk (02/09/2024)    Readmission Risk Interventions     No data to display

## 2024-02-17 NOTE — Progress Notes (Signed)
 Palliative Care Progress Note, Assessment & Plan   Patient Name: Terry [name] [facility]       Date: 02/17/2024 DOB: [birth year]  Age: 88 y.o. MRN#: [id number] Attending Physician: Terry Santa, MD Primary Care Physician: Terry Corner, PA-C Admit Date: 02/09/2024  Subjective: Patient is lying in bed, awake and alert, in no apparent distress.  He acknowledges my presence.  He is minimally interactive but appropriately engaged in discussions.  He gives short one-word answers.  His daughters Terry [name] and Terry [name] are present with Terry [name] son Terry [name] at bedside during my visit.  HPI: 88 y.o. male  with past medical history of hypertension, hyperlipidemia, CAD, stent placement, diastolic CHF, bladder cancer, s/p of ileal conduit, bullous pemphigoid, kidney stone, CAD  admitted on 02/09/2024 with AMS.   Patient found to be COVID-positive.  He is being treated with bronchodilators and as needed Robitussin.  Additionally treated for UTI, pancytopenia, acute metabolic encephalopathy, hypoglycemia, hypophosphatemia, hypokalemia, AKI, protein malnutrition.   PMT was consulted to support patient and family goals of care discussions.   Summary of counseling/coordination of care: Extensive chart review completed prior to meeting patient including labs, vital signs, imaging, progress notes, orders, and available advanced directive documents from current and previous encounters.   After reviewing the patient's chart and assessing the patient at bedside, I spoke with patient's family in [facility] charting room in regards to symptom management and goals of care.   Brief medical update given.  Reviewed patient's hospitalization in October as well as extended hospitalization in January.  Discussed patient's chronic illnesses as well as  acute issues contributing to patient's overall current health state.  Functional, nutritional, and cognitive status discussed is significant Terry [name] to patient's overall prognosis.  Discussed that patient has not had anything to significant to eat or drink over the past 7 days during hospitalization.  Reviewed that patient has multiple episodes of declining medication administration.  Reviewed patient does not want to work with PT.  In all, patient seems to be resistant to aggressive medical treatment at this time.  Space and opportunity provided for family to share their thoughts and emotions regarding patient's current medical situation.  They agree the patient does not want to continue with current regimen.  They share he becomes increasingly agitated with every blood drawl, vital sign check, and medical person that is trying to help him but that he sees as a hindrance.  Discussed that patient is not taking an the caloric or hydration needs necessary to sustain life.  Reviewed that if patient continues on this trajectory without artificial nutrition/hydration, he likely has weeks to live.  Family shares they are in agreement that he will continue to not want to take an p.o.  They also remain supportive that they would never want patient to have artificial nutrition/hydration to sustain his life.  In light of patient's overall poor prognosis, I highlighted the patient is not at active end-of-life stages.  Family shares appreciation understanding that patient is not at end-of-life or actively dying at this time.  However, they are concerned with where he will be when he actually does pass away.  In light of these comments, I discussed potential for enacting hospice services.  Hospice services, philosophy, inpatient unit, and hospice at home reviewed.  Family shares they have worked with hospice services in the past.  They are just not clear on where patient would go at discharge with hospice  services to follow.  I shared that [facility] will follow-up with family to discuss disposition.  I attempted to elicit values, boundaries, and goals of care from family.  They all are in agreement they do not want the patient to suffer.  They do not believe that continue with current regimen is benefiting the patient, and they would like to focus on comfort and quality of life.    They shared several stories of patient envisioning-speaking about his grandmother who his past several decades ago, speaking of how he wants his children to be okay once he is gone, and making discussions as if he is worried about what will happen to them once he has passed away.  Discussed that patient is likely coming to terms and excepting on some level that he is beginning his transition to end-of-life.  Therapeutic silence, active listening, and emotional support provided.  Daughters share agreement that in hindsight patient has been saying and doing lots of things over the last month that make them think he has been preparing for EOL.  In light of these comments, I discussed comfort measures in detail. Terry [name] would no longer receive aggressive medical interventions such as continuous vital signs, lab work, radiology testing, or medications not focused on comfort. All care would focus on how he is looking and feeling. This would include management of any symptoms that may cause discomfort, pain, shortness of breath, cough, nausea, agitation, anxiety, and/or secretions etc. Symptoms would be managed with medications and other non-pharmacological interventions such as spiritual support if requested, repositioning, music therapy, or therapeutic listening.   Family verbalized understanding and appreciation.  They share they would like to know what potential discharge options patient has before shifting to a comfort focused plan.  They are hesitant to make any change to plan of care as I do not want this to impede patient's ability to  be able to transfer out of the hospital to another nursing facility if possible.   No change to plan of care at this time.  After meeting with the family, I notified attending, RN, and TOC of family's wishes to discuss disposition planning.  Conveyed their concerns that if they shift to comfort measures and they would not have options for other discharge placement for patient.  TOC to follow-up with family today.  PMT will continue to follow and support patient and family throughout his hospitalization.  Physical Exam Vitals reviewed.  Constitutional:      Comments: Thin, frail  HENT:     Head:     Comments: Temporal wasting    Mouth/Throat:     Mouth: Mucous membranes are dry.  Eyes:     Pupils: Pupils are equal, round, and reactive to light.  Cardiovascular:     Rate and Rhythm: Normal rate.     Pulses: Normal pulses.  Pulmonary:     Effort: Pulmonary effort is normal.  Abdominal:     Palpations: Abdomen is soft.  Neurological:     Mental Status: He is alert.     Comments: Oriented to self and place  Psychiatric:        Mood and Affect: Mood normal.        Behavior: Behavior normal.        Thought Content: Thought  content normal.        Judgment: Judgment normal.             Total Time 110 minutes   Time spent includes: Detailed review of medical records (labs, imaging, vital signs), medically appropriate exam (mental status, respiratory, cardiac, skin), discussed with treatment team, counseling and educating patient, family and staff, documenting clinical information, medication management and coordination of care.  Samara Deist L. Bonita Quin, DNP, FNP-BC Palliative Medicine Team

## 2024-02-17 NOTE — Progress Notes (Signed)
 OT Cancellation Note  Patient Details Name: Terry [name] [facility] MRN: [id number] DOB: [birth year]   Cancelled Treatment:    Reason Eval/Treat Not Completed: Fatigue/lethargy limiting ability to participate. Chart reviewed, on arrival pt sleeping soundly with daughter at bed side requesting to allow pt to sleep. Will follow POC and re-attempt as available.   Kathie Dike, M.S. OTR/L  02/17/24, 1:09 PM  ascom [phone]

## 2024-02-18 DIAGNOSIS — R638 Other symptoms and signs concerning food and fluid intake: Secondary | ICD-10-CM | POA: Diagnosis not present

## 2024-02-18 DIAGNOSIS — Z66 Do not resuscitate: Secondary | ICD-10-CM | POA: Diagnosis not present

## 2024-02-18 DIAGNOSIS — N390 Urinary tract infection, site not specified: Secondary | ICD-10-CM

## 2024-02-18 DIAGNOSIS — U071 COVID-19: Secondary | ICD-10-CM | POA: Diagnosis not present

## 2024-02-18 DIAGNOSIS — R627 Adult failure to thrive: Secondary | ICD-10-CM

## 2024-02-18 DIAGNOSIS — Z515 Encounter for palliative care: Secondary | ICD-10-CM

## 2024-02-18 LAB — PHOSPHORUS: Phosphorus: 3.1 mg/dL (ref 2.5–4.6)

## 2024-02-18 LAB — CBC WITH DIFFERENTIAL/PLATELET
Abs Immature Granulocytes: 0.06 10*3/uL (ref 0.00–0.07)
Basophils Absolute: 0 10*3/uL (ref 0.0–0.1)
Basophils Relative: 0 %
Eosinophils Absolute: 0.1 10*3/uL (ref 0.0–0.5)
Eosinophils Relative: 1 %
HCT: 32.4 % — ABNORMAL LOW (ref 39.0–52.0)
Hemoglobin: 10.8 g/dL — ABNORMAL LOW (ref 13.0–17.0)
Immature Granulocytes: 1 %
Lymphocytes Relative: 5 %
Lymphs Abs: 0.4 10*3/uL — ABNORMAL LOW (ref 0.7–4.0)
MCH: 31.3 pg (ref 26.0–34.0)
MCHC: 33.3 g/dL (ref 30.0–36.0)
MCV: 93.9 fL (ref 80.0–100.0)
Monocytes Absolute: 0.6 10*3/uL (ref 0.1–1.0)
Monocytes Relative: 8 %
Neutro Abs: 6.5 10*3/uL (ref 1.7–7.7)
Neutrophils Relative %: 85 %
Platelets: 89 10*3/uL — ABNORMAL LOW (ref 150–400)
RBC: 3.45 MIL/uL — ABNORMAL LOW (ref 4.22–5.81)
RDW: 18.7 % — ABNORMAL HIGH (ref 11.5–15.5)
WBC: 7.7 10*3/uL (ref 4.0–10.5)
nRBC: 0 % (ref 0.0–0.2)

## 2024-02-18 LAB — BASIC METABOLIC PANEL
Anion gap: 7 (ref 5–15)
BUN: 23 mg/dL (ref 8–23)
CO2: 19 mmol/L — ABNORMAL LOW (ref 22–32)
Calcium: 8.8 mg/dL — ABNORMAL LOW (ref 8.9–10.3)
Chloride: 117 mmol/L — ABNORMAL HIGH (ref 98–111)
Creatinine, Ser: 1.25 mg/dL — ABNORMAL HIGH (ref 0.61–1.24)
GFR, Estimated: 56 mL/min — ABNORMAL LOW (ref >60)
Glucose, Bld: 94 mg/dL (ref 70–99)
Potassium: 3.6 mmol/L (ref 3.5–5.1)
Sodium: 143 mmol/L (ref 135–145)

## 2024-02-18 LAB — GLUCOSE, CAPILLARY
Glucose-Capillary: 102 mg/dL — ABNORMAL HIGH (ref 70–99)
Glucose-Capillary: 70 mg/dL (ref 70–99)
Glucose-Capillary: 78 mg/dL (ref 70–99)
Glucose-Capillary: 81 mg/dL (ref 70–99)
Glucose-Capillary: 82 mg/dL (ref 70–99)
Glucose-Capillary: 84 mg/dL (ref 70–99)

## 2024-02-18 LAB — MAGNESIUM: Magnesium: 2.1 mg/dL (ref 1.7–2.4)

## 2024-02-18 MED ORDER — SODIUM BICARBONATE 8.4 % IV SOLN
100.0000 meq | Freq: Once | INTRAVENOUS | Status: AC
  Administered 2024-02-18: 100 meq via INTRAVENOUS
  Filled 2024-02-18: qty 100

## 2024-02-18 NOTE — Progress Notes (Addendum)
 Palliative Care Progress Note, Assessment & Plan   Patient Name: Terry [name], Terry [name]       Date: 02/18/2024 DOB: [birth year]  Age: 88 y.o. MRN#: [id number] Attending Physician: Gillis Santa, MD Primary Care Physician: Wilford Corner, PA-C Admit Date: 02/09/2024  Subjective: Patient lying in bed with sister, Darel Hong at bedside. He denies pain. Patient asks several times for his pants so he can "go home". He is in no distress.  HPI: 88 y.o. male with past medical history of hypertension, hyperlipidemia, CAD, stent placement, diastolic CHF, bladder cancer, s/p of ileal conduit, bullous pemphigoid, kidney stone, CAD  admitted on 02/09/2024 with AMS.   Patient found to be COVID-positive.  He is being treated with bronchodilators and as needed Robitussin.  Additionally treated for UTI, pancytopenia, acute metabolic encephalopathy, hypoglycemia, hypophosphatemia, hypokalemia, AKI, protein malnutrition.   PMT was consulted to support patient and family goals of care discussions.   Summary of counseling/coordination of care: Extensive chart review completed prior to meeting patient including labs, vital signs, imaging, progress notes, orders, and available advanced directive documents from current and previous encounters.   After reviewing the patient's chart, the patient was assessed at bedside along with colleague, Terry [name]. Mr. Chew is calm and pleasant. He is in no distress. Sister Darel Hong at bedside. Darel Hong advises the patient has been very thirsty today, drinking several cups of water and an Ensure. While [name] assisted patient with drinking water, he was noted to cough and have difficultly clearing throat. She confirms that patient has frequent coughing episodes after drinking and throughout the day. Darel Hong also  states that she is concerned about her brother not eating. [name] advises that he is not hungry. Soft tray was ordered by family member from food services during visit. She adds that patient has asked for his pants several times today so he can go home.   Counseled with Darel Hong and patient in regards to risk of aspiration.  Discussed that if patient is awake and alert and asking for food/water then small bites and sips should be attempted.  However, awakening patient and offering food when he does not want it is not appropriate.  After assessing the patient, I counseled with attending in regards to concern for aspiration.  Recommendation made for SLP evaluation.  Attending ordered SLP evaluation.  TOC is currently working with family for a safe plan for disposition. He is not currently a candidate for inpatient hospice facility. Home with hospice is not a viable option for family due to having to work. Long term care is not covered by insurance and the patient does not have long term care coverage. TOC has advised family to file Medicaid application ASAP so he may obtain coverage for long term care.   TOC to continue to follow for disposition options.   Discussed patient status with primary RN.  No nursing concerns at this time.   Addendum: NP Terry [name] spoke with patient's daughter/HCPOA Pam over the phone.  Expressed concerns for aspiration.  Discussed that knowing patient's ability to swallow and an appropriately safe diet could be useful no matter what the next step in patient's plan of care is.  Again, discussed comfort measures in detail.  Pam shares that family is leaning toward comfort measures but would like to know what patient's aspiration risk and an appropriate diet is.  I shared that SLP will evaluate and get back to [place] with their recommendations.  PMT will continue to follow and support patient and family throughout his hospitalization.  Physical Exam Vitals reviewed.  Constitutional:       General: He is not in acute distress.    Appearance: He is ill-appearing.     Comments: Frail  HENT:     Mouth/Throat:     Mouth: Mucous membranes are dry.  Pulmonary:     Effort: Pulmonary effort is normal. No respiratory distress.  Skin:    General: Skin is warm and dry.     Comments: Port to R chest   Ecchymosis to BUE  Neurological:     Mental Status: He is alert.  Psychiatric:        Mood and Affect: Mood normal.        Behavior: Behavior normal.             Total Time 35 minutes   Time spent includes: Detailed review of medical records (labs, imaging, vital signs), medically appropriate exam (mental status, respiratory, cardiac, skin), discussed with treatment team, counseling and educating patient, family and staff, documenting clinical information, medication management and coordination of care.     Alex Gardener, [facility]uel [facility] Palliative Medicine Team  02/18/2024 3:06 PM  Office [phone]  Pager [phone]

## 2024-02-18 NOTE — Plan of Care (Signed)
  Problem: Respiratory: Goal: Will maintain a patent airway Outcome: Progressing   Problem: Clinical Measurements: Goal: Ability to maintain clinical measurements within normal limits will improve Outcome: Progressing   Problem: Activity: Goal: Risk for activity intolerance will decrease Outcome: Progressing   Problem: Coping: Goal: Level of anxiety will decrease Outcome: Progressing   Problem: Elimination: Goal: Will not experience complications related to bowel motility Outcome: Progressing   Problem: Pain Managment: Goal: General experience of comfort will improve and/or be controlled Outcome: Progressing   Problem: Safety: Goal: Ability to remain free from injury will improve Outcome: Progressing

## 2024-02-18 NOTE — Progress Notes (Signed)
 PT Cancellation Note  Patient Details Name: Terry [name] [facility] MRN: [id number] DOB: 1936/06/14   Cancelled Treatment:    Reason Eval/Treat Not Completed: Medical issues which prohibited therapy Case discussed with care team, plan to have SLP eval in the AM to assess swallow capacity and get better picture of possible comfort care measures.  Nurse reports pt weak with limited interaction today, suggests waiting until after swallow consult and further family discussion about goals of care before trying to get him to do anything with PT today.     Malachi Pro 02/18/2024, 4:43 PM

## 2024-02-18 NOTE — Progress Notes (Signed)
 [facility] Progress Note  Patient: Terry [name] [facility]    ZOX:[id number]  DOA: 02/09/2024     Date of Service: the patient was seen and examined on 02/18/2024  Chief Complaint  Patient presents with   Weakness   Brief hospital course: Below reviewed from HPI  Terry [name] is a 88 y.o. male with medical history significant of hypertension, hyperlipidemia, CAD, stent placement, diastolic CHF, bladder cancer, s/p of ileal conduit, bullous pemphigoid, kidney stone, CAD, who presents with altered mental status.   Patient has AMS, and is unable to provide accurate medical history, therefore, most of the history is obtained by discussing the case with ED physician, per EMS report, and with the nursing staff.    Per report, patient has been confused in the past several days.  EMS reported that the patient has hypoglycemia with blood glucose initially of 63 and increased with oral glucose to 101.  Patient is reportedly had urinary tract infection since January and has been given antibiotic treatment.  Patient has a generalized weakness, poor appetite, and decreased oral intake.   When I saw patient on the floor, patient has mild dry cough, no respiratory distress, no active nausea, vomiting, diarrhea noted.  Does not seem to have abdominal pain or chest pain.  Patient is confused, knows his own name, not orientated to the place and time.  He moves all extremities.  No facial droop or slurred speech.   Data reviewed independently and ED Course: pt was found to have positive PCR for COVID, WBC 8.0, AKI with creatinine 1.39, BUN 28, GFR 50 (recent baseline creatinine 0.96 on 01/11/2024), positive UA (cloudy appearance, large amount of leukocyte, many bacteria, WBC> 50), temperature normal, blood pressure 122/56, heart rate of 103, RR 24, oxygen saturation 100% on room air.  CT of head is negative.  Chest x-ray negative for infiltration.  Patient is admitted to telemetry bed as inpatient.       EKG: I have personally reviewed.  Seem to be sinus rhythm with frequent PAC, QTc 432, LAD   Assessment and Plan:  COVID-19 virus infection: No oxygen desaturation.  Chest x-ray negative for infiltration.  No fever, clinically not septic. -Continue supportive care -Bronchodilators and prn Robitussin for cough   UTI (urinary tract infection) -s/p Rocephin d/c'd on 2/28 and started ciprofloxacin 500 mg twice daily for 7 days Ucx: Citrobacter freundii, resistant to ceftriaxone.  Sensitive to ciprofloxacin. 3/3 hematuria developed today.  Held [place] for 2 days, resume on 3/5 3/4 hematuria resolved today Monitor H&H US Renal: No obstruction, bilateral renal calculi  Pancytopenia, most likely due to COVID viral infection Thrombocytopenia: Platelet 92--->36-->89 -LDH 276, peripheral smear shows normal platelet morphology Monitor CBC Consulted with oncology, recommended to discontinue methotrexate and stop Brilinta if possible. Discussed with cardio, would not stop Brilinta at this time, awaiting for oncology to weigh again for the use of Brilinta in view of CAD s/p stents    Bruise left lateral chest wall noticed on 3/2 Possible secondary to trauma versus thrombocytopenia CT chest: Left moderate pleural effusion, no significant left lateral hematoma.  Ascites and renal calculi.   Acute metabolic encephalopathy: CT head negative, possibly due to UTI and COVID viral infection -Continue fall precaution   Hypoglycemia -CBG check every 4 hours -As needed D50 -s/p IV fluid D5, changed to NS at 75 cc/h  Hypophosphatemia, Phos repleted. Hypokalemia: Potassium repleted Monitor electrolytes and replete as needed   Coronary artery disease: S/p of stent placement. 3/6  d/c'd Crestor for now due to patient being so lethargic Held Brilinta for 2 days on 3/3 and 3/4 due to hematuria and thrombocytopenia -Continue Brilinta,   Hypercholesteremia: s/p Crestor, d/c'd on 3/6   Benign  essential HTN: Patient not taking medications currently.   -IV hydralazine as needed   AKI (acute kidney injury) (HCC): Likely due to dehydration and UTI, s/p IV fluid as above -Avoid using renal toxic medications Mild acidosis, s/p bicarbonate oral supplement 3/5 bicarbonate 100 mEq IV push given 3/6 bicarbonate 100 mEq IV push given, co 19 low   Protein-calorie malnutrition, severe: Body weight 49.9 kg, BMI 17.23 -Ensure -Consult to nutrition   Abnormal EKG: EKG seems to be sinus rhythm with frequent PAC's,  3/1 repeat EKG shows sinus rhythm, left axis deviation and septal infarct, SR replaced A-fib and a previous EKG   Goals of care discussed with family, patient is DNR/DNI Palliative care consulted for goals of care discussion if patient's condition does not improve.   Body mass index is 17.23 kg/m.  Nutrition Problem: Severe Malnutrition Etiology: chronic illness (CHF) Interventions: Interventions: Ensure Enlive (each supplement provides 350kcal and 20 grams of protein), MVI, Liberalize Diet  Pressure Injury 02/09/24 Sacrum Medial (Active)  02/09/24 2332  Location: Sacrum  Location Orientation: Medial  Staging:   Wound Description (Comments):   Present on Admission: Yes  Dressing Type Foam - Lift dressing to assess site every shift 02/18/24 1005     Diet: Regular diet DVT Prophylaxis: SCD, pharmacological prophylaxis contraindicated due to thrombocytopenia    Advance goals of care discussion: DNR/DNI-limited  Family Communication: family was present at bedside, at the time of interview.  The pt provided permission to discuss medical plan with the family. Opportunity was given to ask question and all questions were answered satisfactorily.  3/5 Repleted electrolytes, we will repeat labs tomorrow as family would like to continue to treat   Disposition:  Pt is from Home, admitted with AMS, UTI and COVID, thrombocytopenia and pancytopenia, which precludes a safe  discharge. Discharge to SNF, when bed will be available.  3/5 peer to peer is done and patient got approved for SNF placement.   Subjective: No significant events overnight, patient remained very weak and lethargic, decreased oral intake.  Denied any complaints.   Physical Exam: General: NAD but seems very lethargic and weak, cachectic Appear in no distress, affect depressed  Eyes: PERRLA ENT: Oral Mucosa Clear, moist  Neck: no JVD,  Cardiovascular: S1 and S2 Present, no Murmur,  Respiratory: Equal air entry bilaterally, mild bibasilar crackles, no wheezes.  Left lateral chest wall bruise Abdomen: Bowel Sound present, Soft and no tenderness, urostomy intact Skin: Left lateral chest wall bruise.  Extremities: No Pedal edema, no calf tenderness Neurologic: without any new focal findings Gait not checked due to patient safety concerns  Vitals:   02/17/24 1641 02/18/24 0015 02/18/24 0757 02/18/24 1550  BP: 120/71 123/77 128/71 122/74  Pulse: 95 98 89 91  Resp: 14 16 14 16   Temp: (!) 97 F (36.1 C) 97.6 F (36.4 C) 97.6 F (36.4 C) 97.7 F (36.5 C)  TempSrc:  Axillary    SpO2: 98% 97% 97% 100%  Weight:      Height:        Intake/Output Summary (Last 24 hours) at 02/18/2024 1644 Last data filed at 02/18/2024 0933 Gross per 24 hour  Intake --  Output 550 ml  Net -550 ml    Filed Weights   02/09/24 1929 02/09/24  2250  Weight: 52.2 kg 49.9 kg    Data Reviewed: I have personally reviewed and interpreted daily labs, tele strips, imagings as discussed above. I reviewed all nursing notes, pharmacy notes, vitals, pertinent old records I have discussed plan of care as described above with RN and patient/family.  CBC: Recent Labs  Lab 02/14/24 0526 02/15/24 0235 02/16/24 0556 02/17/24 0445 02/18/24 0631  WBC 7.2 6.1 4.4 4.9 7.7  NEUTROABS 6.1 4.7 3.3 3.7 6.5  HGB 9.4* 9.9* 8.7* 9.1* 10.8*  HCT 27.9* 29.4* 25.2* 27.2* 32.4*  MCV 93.6 94.8 91.6 95.8 93.9  PLT 37* 46*  61* 67* 89*   Basic Metabolic Panel: Recent Labs  Lab 02/14/24 0526 02/15/24 0235 02/16/24 0556 02/17/24 0445 02/18/24 0631  NA 144 142 143 142 143  K 3.5 3.2* 3.2* 3.7 3.6  CL 119* 117* 119* 119* 117*  CO2 19* 20* 21* 19* 19*  GLUCOSE 87 104* 84 82 94  BUN 25* 28* 24* 23 23  CREATININE 1.28* 1.24 1.14 1.18 1.25*  CALCIUM 8.7* 9.0 8.4* 8.5* 8.8*  MG 1.8 2.1 1.9 1.9 2.1  PHOS 2.9 2.2* 2.9 2.4* 3.1    Studies: No results found.    Scheduled Meds:  bisacodyl  10 mg Oral QHS   bisacodyl  10 mg Oral Once   Chlorhexidine Gluconate Cloth  6 each Topical Daily   cholecalciferol  1,000 Units Oral Daily   feeding supplement  237 mL Oral TID BM   folic acid  1 mg Oral Daily   multivitamin with minerals  1 tablet Oral Daily   polyethylene glycol  17 g Oral BID   ticagrelor  90 mg Oral BID   Continuous Infusions:  ciprofloxacin 400 mg (02/18/24 1006)    PRN Meds: diphenhydrAMINE **AND** acetaminophen, acetaminophen, albuterol, bisacodyl, dextrose, guaiFENesin, hydrALAZINE, nitroGLYCERIN, ondansetron (ZOFRAN) IV, mouth rinse  Time spent: 55 minutes  Author: Gillis Santa. MD [facility] 02/18/2024 4:44 PM  To reach On-call, see care teams to locate the attending and reach out to them via [email or web address]. If 7PM-7AM, please contact night-coverage If you still have difficulty reaching the attending provider, please page the [facility] (Director on Call) for [facility] on amion for assistance.

## 2024-02-19 DIAGNOSIS — U071 COVID-19: Secondary | ICD-10-CM | POA: Diagnosis not present

## 2024-02-19 DIAGNOSIS — N390 Urinary tract infection, site not specified: Secondary | ICD-10-CM | POA: Diagnosis not present

## 2024-02-19 DIAGNOSIS — Z515 Encounter for palliative care: Secondary | ICD-10-CM | POA: Diagnosis not present

## 2024-02-19 DIAGNOSIS — Z7189 Other specified counseling: Secondary | ICD-10-CM

## 2024-02-19 DIAGNOSIS — R627 Adult failure to thrive: Secondary | ICD-10-CM | POA: Diagnosis not present

## 2024-02-19 LAB — GLUCOSE, CAPILLARY
Glucose-Capillary: 100 mg/dL — ABNORMAL HIGH (ref 70–99)
Glucose-Capillary: 106 mg/dL — ABNORMAL HIGH (ref 70–99)
Glucose-Capillary: 69 mg/dL — ABNORMAL LOW (ref 70–99)
Glucose-Capillary: 71 mg/dL (ref 70–99)
Glucose-Capillary: 87 mg/dL (ref 70–99)
Glucose-Capillary: 89 mg/dL (ref 70–99)
Glucose-Capillary: 95 mg/dL (ref 70–99)

## 2024-02-19 LAB — BASIC METABOLIC PANEL
Anion gap: 7 (ref 5–15)
BUN: 23 mg/dL (ref 8–23)
CO2: 22 mmol/L (ref 22–32)
Calcium: 8.8 mg/dL — ABNORMAL LOW (ref 8.9–10.3)
Chloride: 116 mmol/L — ABNORMAL HIGH (ref 98–111)
Creatinine, Ser: 1.27 mg/dL — ABNORMAL HIGH (ref 0.61–1.24)
GFR, Estimated: 55 mL/min — ABNORMAL LOW (ref >60)
Glucose, Bld: 88 mg/dL (ref 70–99)
Potassium: 3.3 mmol/L — ABNORMAL LOW (ref 3.5–5.1)
Sodium: 145 mmol/L (ref 135–145)

## 2024-02-19 LAB — CBC WITH DIFFERENTIAL/PLATELET
Abs Immature Granulocytes: 0.05 10*3/uL (ref 0.00–0.07)
Basophils Absolute: 0 10*3/uL (ref 0.0–0.1)
Basophils Relative: 0 %
Eosinophils Absolute: 0.1 10*3/uL (ref 0.0–0.5)
Eosinophils Relative: 1 %
HCT: 30.2 % — ABNORMAL LOW (ref 39.0–52.0)
Hemoglobin: 10 g/dL — ABNORMAL LOW (ref 13.0–17.0)
Immature Granulocytes: 1 %
Lymphocytes Relative: 9 %
Lymphs Abs: 0.6 10*3/uL — ABNORMAL LOW (ref 0.7–4.0)
MCH: 31.8 pg (ref 26.0–34.0)
MCHC: 33.1 g/dL (ref 30.0–36.0)
MCV: 96.2 fL (ref 80.0–100.0)
Monocytes Absolute: 0.7 10*3/uL (ref 0.1–1.0)
Monocytes Relative: 11 %
Neutro Abs: 4.5 10*3/uL (ref 1.7–7.7)
Neutrophils Relative %: 78 %
Platelets: 92 10*3/uL — ABNORMAL LOW (ref 150–400)
RBC: 3.14 MIL/uL — ABNORMAL LOW (ref 4.22–5.81)
RDW: 18.6 % — ABNORMAL HIGH (ref 11.5–15.5)
WBC: 5.9 10*3/uL (ref 4.0–10.5)
nRBC: 0 % (ref 0.0–0.2)

## 2024-02-19 MED ORDER — GLYCOPYRROLATE 0.2 MG/ML IJ SOLN: 0.2000 mg | INTRAMUSCULAR | Status: DC | PRN

## 2024-02-19 MED ORDER — GLYCOPYRROLATE 1 MG PO TABS: 1.0000 mg | ORAL_TABLET | ORAL | Status: DC | PRN

## 2024-02-19 MED ORDER — HALOPERIDOL 0.5 MG PO TABS
2.0000 mg | ORAL_TABLET | Freq: Four times a day (QID) | ORAL | Status: DC | PRN
  Filled 2024-02-19: qty 4

## 2024-02-19 MED ORDER — ONDANSETRON HCL 4 MG/2ML IJ SOLN: 4.0000 mg | Freq: Four times a day (QID) | INTRAMUSCULAR | Status: DC | PRN

## 2024-02-19 MED ORDER — POTASSIUM CHLORIDE 2 MEQ/ML IV SOLN: INTRAVENOUS | Status: DC

## 2024-02-19 MED ORDER — DIPHENHYDRAMINE HCL 50 MG/ML IJ SOLN: 25.0000 mg | INTRAMUSCULAR | Status: DC | PRN

## 2024-02-19 MED ORDER — MORPHINE SULFATE (PF) 2 MG/ML IV SOLN
2.0000 mg | INTRAVENOUS | Status: DC | PRN
  Administered 2024-02-20 – 2024-02-24 (×12): 2 mg via INTRAVENOUS
  Filled 2024-02-19 (×12): qty 1

## 2024-02-19 MED ORDER — BIOTENE DRY MOUTH MT LIQD
15.0000 mL | Freq: Two times a day (BID) | OROMUCOSAL | Status: DC
  Administered 2024-02-19 – 2024-02-22 (×5): 15 mL via TOPICAL

## 2024-02-19 MED ORDER — HALOPERIDOL LACTATE 2 MG/ML PO CONC
2.0000 mg | Freq: Four times a day (QID) | ORAL | Status: DC | PRN
  Administered 2024-02-20: 2 mg via SUBLINGUAL
  Filled 2024-02-19: qty 5

## 2024-02-19 MED ORDER — ONDANSETRON 4 MG PO TBDP: 4.0000 mg | ORAL_TABLET | Freq: Four times a day (QID) | ORAL | Status: DC | PRN

## 2024-02-19 MED ORDER — CIPROFLOXACIN IN D5W 400 MG/200ML IV SOLN
400.0000 mg | Freq: Two times a day (BID) | INTRAVENOUS | Status: DC
  Administered 2024-02-19: 400 mg via INTRAVENOUS
  Filled 2024-02-19: qty 200

## 2024-02-19 MED ORDER — POLYVINYL ALCOHOL 1.4 % OP SOLN: 1.0000 [drp] | Freq: Four times a day (QID) | OPHTHALMIC | Status: DC | PRN

## 2024-02-19 MED ORDER — LORAZEPAM 2 MG/ML IJ SOLN
1.0000 mg | INTRAMUSCULAR | Status: DC | PRN
  Administered 2024-02-22 – 2024-02-24 (×2): 1 mg via INTRAVENOUS
  Filled 2024-02-19 (×2): qty 1

## 2024-02-19 MED ORDER — HALOPERIDOL LACTATE 5 MG/ML IJ SOLN
2.0000 mg | Freq: Four times a day (QID) | INTRAMUSCULAR | Status: DC | PRN
  Administered 2024-02-19 – 2024-02-24 (×5): 2 mg via INTRAVENOUS
  Filled 2024-02-19 (×7): qty 1

## 2024-02-19 MED ORDER — ACETAMINOPHEN 650 MG RE SUPP: 650.0000 mg | Freq: Four times a day (QID) | RECTAL | Status: DC | PRN

## 2024-02-19 MED ORDER — MORPHINE SULFATE (CONCENTRATE) 10 MG /0.5 ML PO SOLN: 5.0000 mg | ORAL | Status: DC | PRN

## 2024-02-19 MED ORDER — ACETAMINOPHEN 325 MG PO TABS: 650.0000 mg | ORAL_TABLET | Freq: Four times a day (QID) | ORAL | Status: DC | PRN

## 2024-02-19 MED ORDER — KCL IN DEXTROSE-NACL 20-5-0.45 MEQ/L-%-% IV SOLN
INTRAVENOUS | Status: DC
Start: 2024-02-19 — End: 2024-02-19
  Filled 2024-02-19: qty 1000

## 2024-02-19 MED ORDER — DEXTROSE 50 % IV SOLN
12.5000 g | INTRAVENOUS | Status: DC
  Filled 2024-02-19: qty 50

## 2024-02-19 MED ORDER — POTASSIUM CHLORIDE 10 MEQ/100ML IV SOLN
10.0000 meq | INTRAVENOUS | Status: AC
Start: 2024-02-19 — End: 2024-02-19
  Administered 2024-02-19 (×2): 10 meq via INTRAVENOUS
  Filled 2024-02-19 (×2): qty 100

## 2024-02-19 MED ORDER — POLYETHYLENE GLYCOL 3350 17 G PO PACK: 17.0000 g | PACK | Freq: Every day | ORAL | Status: DC | PRN

## 2024-02-19 NOTE — Plan of Care (Signed)
°  Problem: Education: Goal: Knowledge of risk factors and measures for prevention of condition will improve Outcome: Progressing   Problem: Safety: Goal: Ability to remain free from injury will improve Outcome: Progressing   Problem: Skin Integrity: Goal: Risk for impaired skin integrity will decrease Outcome: Progressing

## 2024-02-19 NOTE — Evaluation (Signed)
 Clinical/Bedside Swallow Evaluation Patient Details  Name: Terry [name] MRN: [id number] Date of Birth: 01/25/1936  Today's Date: 02/19/2024 Time: SLP Start Time (ACUTE ONLY): 1110 SLP Stop Time (ACUTE ONLY): 1130 SLP Time Calculation (min) (ACUTE ONLY): 20 min  Past Medical History:  Past Medical History:  Diagnosis Date   AKI (acute kidney injury) (HCC) 01/09/2024   Bullous pemphigoid    CAD (coronary artery disease)    Cancer (HCC)    bladder cancer   Hyperlipidemia    Hypertension    Ischemic cardiomyopathy    Kidney stones    Nephrolithiasis    Status post surgical removal and fulguration of bladder neoplasm    Past Surgical History:  Past Surgical History:  Procedure Laterality Date   BLADDER SURGERY  06/2015   CORONARY/GRAFT ACUTE MI REVASCULARIZATION N/A 08/28/2023   Procedure: Coronary/Graft Acute MI Revascularization;  Surgeon: Yates Decamp, MD;  Location: [facility] INVASIVE CV LAB;  Service: Cardiovascular;  Laterality: N/A;   deviated nose septum surgery  1970   EP IMPLANTABLE DEVICE     HERNIA REPAIR  1980   single septum port     stomach ulcer surgery  1994   vascular stent  2006   HPI:  Terry [name] is a 88 y.o. male with medical history significant of hypertension, hyperlipidemia, CAD, stent placement, diastolic CHF, bladder cancer, s/p of ileal conduit, bullous pemphigoid, kidney stone, CAD, who presents with altered mental status on 02/09/2024 from home. Pt diagnosed with COVID 19 and recent chest x-ray revealing Advanced emphysema with irregular scarring at the apices.  Moderate left and small right pleural effusions. Partial left lower lobe consolidation, atelectasis versus pneumonia.    Assessment / Plan / Recommendation  Clinical Impression  Pt being followed by Palliative Care in hopes of establishing GOC. On 02/18/2024 pt observed with overt s/s of aspiration when consuming thin liquids. ST ordered for further evaluation. Pt's daughter was at  bedside during this evaluation. Pt presents as requiring advanced effort to communicate and participated in evaluation. He presents with increased risk of aspiration when consuming ice chips and sips of thin liquids d/t what is likely moderate to severe oropharyngeal dysphagia. When consuming ice chips and thin liquids pt appeared fatigued as evidenced by sighing, laying his head back against the pillow and mild increase in WOB. Audible respirations could be heard following each swallow. Multiple swallows were observed with each trial. Weak unproductive cough suspect related to potential delayed swallow possible decreased oral containment of bolus given overall deconditioned state. At this time, a safe diet recommendation cannot be made at bedside with recommendation of NPO. Education provided to pt's daughter on results of this evaluation (s/s of aspiration to which she commented "I have noticed that too"). Given pt's deconditioned state, he is not able to participate in skilled swallow rehabilitation at this time. ST services to sign off with attending and Palliative Care to further discuss comfort options for pt. SLP Visit Diagnosis: Dysphagia, oropharyngeal phase (R13.12)    Aspiration Risk  Severe aspiration risk;Risk for inadequate nutrition/hydration    Diet Recommendation NPO    Medication Administration: Via alternative means    Other  Recommendations Oral Care Recommendations: Oral care QID    Recommendations for follow up therapy are one component of a multi-disciplinary discharge planning process, led by the attending physician.  Recommendations may be updated based on patient status, additional functional criteria and insurance authorization.  Follow up Recommendations No SLP follow up  Assistance Recommended at Discharge  N/A  Functional Status Assessment  N/A  Frequency and Duration   N/A         Prognosis Prognosis for improved oropharyngeal function:  (Poor) Barriers to  Reach Goals: Severity of deficits;Time post onset (severity of deconditioned state)      Swallow Study   General Date of Onset: 02/18/24 HPI: Terry [name] is a 88 y.o. male with medical history significant of hypertension, hyperlipidemia, CAD, stent placement, diastolic CHF, bladder cancer, s/p of ileal conduit, bullous pemphigoid, kidney stone, CAD, who presents with altered mental status on 02/09/2024 from home. Pt diagnosed with COVID 19 and recent chest x-ray revealing Advanced emphysema with irregular scarring at the apices.  Moderate left and small right pleural effusions. Partial left lower lobe consolidation, atelectasis versus pneumonia. Type of Study: Bedside Swallow Evaluation Previous Swallow Assessment: none in chart Diet Prior to this Study: Regular;Thin liquids (Level 0) Temperature Spikes Noted: No Respiratory Status: Room air History of Recent Intubation: No Behavior/Cognition: Alert (decondition, fatigued) Oral Cavity Assessment: Within Functional Limits Oral Care Completed by SLP: No Self-Feeding Abilities: Total assist Patient Positioning: Upright in bed Baseline Vocal Quality: Low vocal intensity Volitional Cough: Cognitively unable to elicit Volitional Swallow: Unable to elicit    Oral/Motor/Sensory Function Overall Oral Motor/Sensory Function: Generalized oral weakness   Ice Chips Ice chips: Impaired Presentation: Spoon Pharyngeal Phase Impairments: Suspected delayed Swallow;Decreased hyoid-laryngeal movement;Multiple swallows;Cough - Immediate   Thin Liquid Thin Liquid: Impaired Presentation: Spoon;Cup Pharyngeal  Phase Impairments: Suspected delayed Swallow;Decreased hyoid-laryngeal movement;Multiple swallows;Wet Vocal Quality;Cough - Immediate    Nectar Thick Nectar Thick Liquid: Not tested   Honey Thick Honey Thick Liquid: Not tested   Puree Puree: Not tested   Solid     Solid: Not tested      Terry [name] B. Dreama Saa, M.S., CCC-SLP,  Tree surgeon Certified Brain Injury Specialist [facility]  [facility] Office [phone] Ascom [phone] Fax [phone]

## 2024-02-19 NOTE — Progress Notes (Signed)
 OT Cancellation Note  Patient Details Name: Terry [name] [facility] MRN: [id number] DOB: 09/03/1936   Cancelled Treatment:    Reason Eval/Treat Not Completed: Patient declined, pt daughter stated pt is "way too weak today, hasn't been up in a while." Informed daughter that pt took some steps with OT on Monday with assistance and rolling walker. Daughter persisted that pt was too tired and weak. Will re attempt session at next available date.    Glenard Haring M.S. OTR/L  02/19/24, 9:55 AM

## 2024-02-19 NOTE — Progress Notes (Signed)
 Palliative Care Progress Note, Assessment & Plan   Patient Name: Terry [name]       Date: 02/19/2024 DOB: December 13, 1936  Age: 88 y.o. MRN#: [id number] Attending Physician: Gillis Santa, MD Primary Care Physician: Wilford Corner, PA-C Admit Date: 02/09/2024  Subjective: Pt resting in bed. He denies pain.   HPI: 88 y.o. male with past medical history of hypertension, hyperlipidemia, CAD, stent placement, diastolic CHF, bladder cancer, s/p of ileal conduit, bullous pemphigoid, kidney stone, CAD  admitted on 02/09/2024 with AMS.   Patient found to be COVID-positive.  He is being treated with bronchodilators and as needed Robitussin.  Additionally treated for UTI, pancytopenia, acute metabolic encephalopathy, hypoglycemia, hypophosphatemia, hypokalemia, AKI, protein malnutrition.   PMT was consulted to support patient and family goals of care discussions.   Summary of counseling/coordination of care: Extensive chart review completed prior to meeting patient including labs, vital signs, imaging, progress notes, orders, and available advanced directive documents from current and previous encounters.   After reviewing the patient's chart and I assessed the patient at bedside spoke along with colleague, Terry [name] with patient's daughter, Terry [name], in regards to symptom management and goals of care.   Mr. Juba observed to be not as interactive as yesterday. He does not engage in conversation. He answers no to question of pain, but unsure if he comprehends. He appears uncomfortable at times, grimacing and pulling at IV tubing. He is in no distress.   Reviewed with Terry [name] the findings of SLP that Mr. Padmore is a severe aspiration risk and the reason his status has been changed to nothing by mouth. She endorses  understanding of conversation with Terry [name], PMT, yesterday and desires to transition her father to comfort care, with awareness all care and treatment will now be focused on symptom management and providing comfort rather than aggressively treating disease processes. She adds that her father told her this morning that he is "tired of all this". Terry [name] states that this is what she needed to hear to be able to make the decision for comfort care today. Discussed that Mr. Jastrzebski's symptoms such as pain, shortness of breath, nausea, anxiety and agitation would be treated with medications given by IV or liquid given by mouth. He would also be able to have foods/drink at his request with the expectation that he may have aspiration events. Terry [name] is in agreement and wishes to proceed with comfort care.   Discussion was had related to hospice services and the difference between home with hospice, LTC with hospice following versus hospice IPU. Per previous discussions home with hospice is not an option since family is not available or able to provide care. Family aware LTC with hospice following would be out of pocket cost but Medicaid application has been initiated. IPU eligibility discussed with daughter for which she expresses interest. TOC and hospice liaison contacted to assess patient tomorrow for inpatient hospice eligibility.  Therapeutic silence and active listening provided for Terry [name] to share her thoughts and emotions regarding current medical situation. Discussed prognosis which is likely limited to a week/days. Emotional support provided.  Comfort orders placed and discussed with primary RN.   Morphine PRN for pain/air hunger/comfort Robinul PRN for excessive secretions  Ativan PRN for agitation/anxiety Zofran PRN for nausea Liquifilm tears PRN for dry eyes Haldol PRN for agitation/anxiety May have comfort feeding Comfort cart for family Unrestricted visitations in the setting of EOL (per policy) Oxygen  PRN 2L or less for comfort. No escalation.    PMT will continue to follow and support patient and family throughout his hospitalization.  Physical Exam Constitutional:      General: He is not in acute distress.    Appearance: He is ill-appearing.     Comments: Frail  Pulmonary:     Effort: Pulmonary effort is normal. No respiratory distress.  Musculoskeletal:     Right lower leg: No edema.     Left lower leg: No edema.  Skin:    General: Skin is warm and dry.     Comments: Port to R chest             Total Time 65 minutes   Time spent includes: Detailed review of medical records (labs, imaging, vital signs), medically appropriate exam (mental status, respiratory, cardiac, skin), discussed with treatment team, counseling and educating patient, family and staff, documenting clinical information, medication management and coordination of care.     Alex Gardener, Juel Burrow- [name] Palliative Medicine Team  02/19/2024 3:20 PM  Office [phone]  Pager [phone]

## 2024-02-19 NOTE — Plan of Care (Signed)
  Problem: Respiratory: Goal: Will maintain a patent airway Outcome: Progressing   Problem: Clinical Measurements: Goal: Diagnostic test results will improve Outcome: Progressing   Problem: Activity: Goal: Risk for activity intolerance will decrease Outcome: Progressing   Problem: Coping: Goal: Level of anxiety will decrease Outcome: Progressing   Problem: Elimination: Goal: Will not experience complications related to bowel motility Outcome: Progressing   Problem: Pain Managment: Goal: General experience of comfort will improve and/or be controlled Outcome: Progressing   Problem: Safety: Goal: Ability to remain free from injury will improve Outcome: Progressing

## 2024-02-19 NOTE — Progress Notes (Signed)
 Nutrition Brief Note  Chart reviewed. Per chart review, pt family leaning towards comfort measures, however, awaiting SLP evaluation prior to decision. SLP has evaluated and recommending NPO status. Palliative care following for goals of care discussions.  No further nutrition interventions planned at this time.  Please re-consult as needed.   Levada Schilling, RD, LDN, CDCES Registered Dietitian III Certified Diabetes Care and Education Specialist If unable to reach this RD, please use "RD Inpatient" group chat on secure chat between hours of 8am-4 pm daily

## 2024-02-19 NOTE — Progress Notes (Signed)
 [facility] Progress Note  Patient: Terry [name] [facility]    ZHY:[id number]  DOA: 02/09/2024     Date of Service: the patient was seen and examined on 02/19/2024  Chief Complaint  Patient presents with   Weakness   Brief hospital course: Below reviewed from HPI  Terry [name] is a 88 y.o. male with medical history significant of hypertension, hyperlipidemia, CAD, stent placement, diastolic CHF, bladder cancer, s/p of ileal conduit, bullous pemphigoid, kidney stone, CAD, who presents with altered mental status.   Patient has AMS, and is unable to provide accurate medical history, therefore, most of the history is obtained by discussing the case with ED physician, per EMS report, and with the nursing staff.    Per report, patient has been confused in the past several days.  EMS reported that the patient has hypoglycemia with blood glucose initially of 63 and increased with oral glucose to 101.  Patient is reportedly had urinary tract infection since January and has been given antibiotic treatment.  Patient has a generalized weakness, poor appetite, and decreased oral intake.   When I saw patient on the floor, patient has mild dry cough, no respiratory distress, no active nausea, vomiting, diarrhea noted.  Does not seem to have abdominal pain or chest pain.  Patient is confused, knows his own name, not orientated to the place and time.  He moves all extremities.  No facial droop or slurred speech.   Data reviewed independently and ED Course: pt was found to have positive PCR for COVID, WBC 8.0, AKI with creatinine 1.39, BUN 28, GFR 50 (recent baseline creatinine 0.96 on 01/11/2024), positive UA (cloudy appearance, large amount of leukocyte, many bacteria, WBC> 50), temperature normal, blood pressure 122/56, heart rate of 103, RR 24, oxygen saturation 100% on room air.  CT of head is negative.  Chest x-ray negative for infiltration.  Patient is admitted to telemetry bed as inpatient.       EKG: I have personally reviewed.  Seem to be sinus rhythm with frequent PAC, QTc 432, LAD   Assessment and Plan:  # Comfort care only transitioned on 3/7 Palliative care help appreciated.  Due to poor prognosis, goals of care discussed with patient's daughter [name] and she agreed to stop treatment and transition to comfort measures only.   During hospital stay patient was managed as below   # COVID-19 virus infection: No oxygen desaturation.  Chest x-ray negative for infiltration.  No fever, clinically not septic. Continued supportive care S/p Bronchodilators and prn Robitussin for cough   UTI (urinary tract infection) s/p Rocephin d/c'd on 2/28 and s/p ciprofloxacin 500 mg twice daily for 7 days Ucx: Citrobacter freundii, resistant to ceftriaxone.  Sensitive to ciprofloxacin. 3/3 hematuria developed.  Held [place] for 2 days, resume on 3/5 3/4 hematuria resolved. US Renal: No obstruction, bilateral renal calculi  Pancytopenia, most likely due to COVID viral infection Thrombocytopenia: Platelet 92--->36-->89 -LDH 276, peripheral smear shows normal platelet morphology Consulted with oncology, recommended to discontinue methotrexate and stop Brilinta if possible. Discussed with cardio, would not stop Brilinta at this time, awaiting for oncology to weigh again for the use of Brilinta in view of CAD s/p stents   Bruise left lateral chest wall noticed on 3/2 Possible secondary to trauma versus thrombocytopenia CT chest: Left moderate pleural effusion, no significant left lateral hematoma.  Ascites and renal calculi.  Acute metabolic encephalopathy: CT head negative, possibly due to UTI and COVID viral infection. Continue fall precaution  Hypoglycemia due to poor oral intake and dysphagia SLP will be done, patient was kept n.p.o. due to risk of aspiration It was discussed with family as well and agreed with n.p.o. status.  Patient was given IV fluid for hydration but now patient has  been transition to comfort measures only so IV fluid has been discontinued.  Hypophosphatemia, Phos repleted. Hypokalemia: Potassium repleted Coronary artery disease: S/p of stent placement. 3/6 d/c'd Crestor for now due to patient being so lethargic Held Brilinta for 2 days on 3/3 and 3/4 due to hematuria and thrombocytopenia D/c'd Brilinta on 3/7 due to comfort measures only Hypercholesteremia: s/p Crestor, d/c'd on 3/6 Benign essential HTN: Patient not taking medications currently.   AKI (acute kidney injury) (HCC): Likely due to dehydration and UTI, s/p IV fluid as above. Mild acidosis, s/p bicarbonate oral supplement 3/5 bicarbonate 100 mEq IV push given 3/6 bicarbonate 100 mEq IV push given, co 22 now Protein-calorie malnutrition, severe: Body weight 49.9 kg, BMI 17.23 S/p Ensure and nutritionist consult Abnormal EKG: EKG seems to be sinus rhythm with frequent PAC's,  3/1 repeat EKG shows sinus rhythm, left axis deviation and septal infarct, SR replaced A-fib and a previous EKG   Goals of care discussed with family, patient is DNR/DNI Palliative care consulted for goals of care discussion and patient was transitioned to comfort measures only on 3/7   Body mass index is 17.23 kg/m.  Nutrition Problem: Severe Malnutrition Etiology: chronic illness (CHF) Interventions: Interventions: Ensure Enlive (each supplement provides 350kcal and 20 grams of protein), MVI, Liberalize Diet  Pressure Injury 02/09/24 Sacrum Medial (Active)  02/09/24 2332  Location: Sacrum  Location Orientation: Medial  Staging:   Wound Description (Comments):   Present on Admission: Yes  Dressing Type Foam - Lift dressing to assess site every shift 02/18/24 2030     Diet: Regular diet DVT Prophylaxis: SCD, pharmacological prophylaxis contraindicated due to thrombocytopenia    Advance goals of care discussion: DNR/DNI-limited  Family Communication: family was not present at bedside, at the time of  interview.  Patient is AAO x 1, so palliative care is following and patient was transition to comfort measures only  Disposition:  Pt is from Home, admitted with AMS, UTI and COVID, thrombocytopenia and pancytopenia, which precludes a safe discharge. Discharge to hospice, when bed will be available.  3/5 peer to peer is done and patient got approved for SNF placement. 3/7 patient was transition to comfort measures only, awaiting for hospice placement  Subjective: No significant events overnight, patient remained very weak and lethargic, decreased oral intake.  Denied any complaints.  Physical Exam: General: NAD but seems very lethargic and weak, cachectic Appear in no distress, affect depressed  Eyes: PERRLA ENT: Oral Mucosa Clear, dry  Neck: no JVD,  Cardiovascular: S1 and S2 Present, no Murmur,  Respiratory: Equal air entry bilaterally, mild bibasilar crackles, no wheezes.  Left lateral chest wall bruise Abdomen: Bowel Sound present, Soft and no tenderness, urostomy intact Skin: Left lateral chest wall bruise.  Extremities: No Pedal edema, no calf tenderness Neurologic: without any new focal findings Gait not checked due to patient safety concerns  Vitals:   02/18/24 0757 02/18/24 1550 02/19/24 0004 02/19/24 0810  BP: 128/71 122/74 124/82 118/77  Pulse: 89 91 78 77  Resp: 14 16 16 16   Temp: 97.6 F (36.4 C) 97.7 F (36.5 C) 97.6 F (36.4 C) 97.8 F (36.6 C)  TempSrc:      SpO2: 97% 100% 96% 96%  Weight:  Height:        Intake/Output Summary (Last 24 hours) at 02/19/2024 1455 Last data filed at 02/19/2024 0500 Gross per 24 hour  Intake --  Output 550 ml  Net -550 ml    Filed Weights   02/09/24 1929 02/09/24 2250  Weight: 52.2 kg 49.9 kg    Data Reviewed: I have personally reviewed and interpreted daily labs, tele strips, imagings as discussed above. I reviewed all nursing notes, pharmacy notes, vitals, pertinent old records I have discussed plan of care as  described above with RN and patient/family.  CBC: Recent Labs  Lab 02/15/24 0235 02/16/24 0556 02/17/24 0445 02/18/24 0631 02/19/24 0625  WBC 6.1 4.4 4.9 7.7 5.9  NEUTROABS 4.7 3.3 3.7 6.5 4.5  HGB 9.9* 8.7* 9.1* 10.8* 10.0*  HCT 29.4* 25.2* 27.2* 32.4* 30.2*  MCV 94.8 91.6 95.8 93.9 96.2  PLT 46* 61* 67* 89* 92*   Basic Metabolic Panel: Recent Labs  Lab 02/14/24 0526 02/15/24 0235 02/16/24 0556 02/17/24 0445 02/18/24 0631 02/19/24 0625  NA 144 142 143 142 143 145  K 3.5 3.2* 3.2* 3.7 3.6 3.3*  CL 119* 117* 119* 119* 117* 116*  CO2 19* 20* 21* 19* 19* 22  GLUCOSE 87 104* 84 82 94 88  BUN 25* 28* 24* 23 23 23   CREATININE 1.28* 1.24 1.14 1.18 1.25* 1.27*  CALCIUM 8.7* 9.0 8.4* 8.5* 8.8* 8.8*  MG 1.8 2.1 1.9 1.9 2.1  --   PHOS 2.9 2.2* 2.9 2.4* 3.1  --     Studies: No results found.    Scheduled Meds:  bisacodyl  10 mg Oral QHS   bisacodyl  10 mg Oral Once   Chlorhexidine Gluconate Cloth  6 each Topical Daily   cholecalciferol  1,000 Units Oral Daily   dextrose  12.5 g Intravenous STAT   feeding supplement  237 mL Oral TID BM   folic acid  1 mg Oral Daily   multivitamin with minerals  1 tablet Oral Daily   polyethylene glycol  17 g Oral BID   ticagrelor  90 mg Oral BID   Continuous Infusions:  ciprofloxacin 400 mg (02/19/24 1024)   dextrose 5 % and 0.45 % NaCl with KCl 20 mEq/L 1,000 mL infusion 50 mL/hr at 02/19/24 1208    PRN Meds: diphenhydrAMINE **AND** acetaminophen, acetaminophen, albuterol, bisacodyl, dextrose, guaiFENesin, hydrALAZINE, nitroGLYCERIN, ondansetron (ZOFRAN) IV, mouth rinse  Time spent: 40 minutes  Author: Gillis Santa. MD [facility] 02/19/2024 2:55 PM  To reach On-call, see care teams to locate the attending and reach out to them via [email or web address]. If 7PM-7AM, please contact night-coverage If you still have difficulty reaching the attending provider, please page the [facility] (Director on Call) for [facility] on amion for  assistance.

## 2024-02-20 DIAGNOSIS — Z711 Person with feared health complaint in whom no diagnosis is made: Secondary | ICD-10-CM

## 2024-02-20 DIAGNOSIS — U071 COVID-19: Secondary | ICD-10-CM | POA: Diagnosis not present

## 2024-02-20 DIAGNOSIS — G9341 Metabolic encephalopathy: Secondary | ICD-10-CM | POA: Diagnosis not present

## 2024-02-20 DIAGNOSIS — Z515 Encounter for palliative care: Secondary | ICD-10-CM | POA: Diagnosis not present

## 2024-02-20 DIAGNOSIS — Z7189 Other specified counseling: Secondary | ICD-10-CM | POA: Diagnosis not present

## 2024-02-20 DIAGNOSIS — E43 Unspecified severe protein-calorie malnutrition: Secondary | ICD-10-CM | POA: Diagnosis not present

## 2024-02-20 NOTE — Progress Notes (Signed)
 Palliative Care Progress Note, Assessment & Plan   Patient Name: Terry [name]       Date: 02/20/2024 DOB: December 09, 1936  Age: 88 y.o. MRN#: [id number] Attending Physician: Terry Finner, MD Primary Care Physician: Terry Corner, PA-C Admit Date: 02/09/2024  Subjective: Pt resting in bed. He denies pain and states he wants water. His daughter reports that he became agitated last night and was inconsolable, requiring haldol. She adds that even if he is in pain, he will not complain. Patient requesting water.   HPI: 88 y.o. male with past medical history of hypertension, hyperlipidemia, CAD, stent placement, diastolic CHF, bladder cancer, s/p of ileal conduit, bullous pemphigoid, kidney stone, CAD  admitted on 02/09/2024 with AMS.   Patient found to be COVID-positive.  He is being treated with bronchodilators and as needed Robitussin.  Additionally treated for UTI, pancytopenia, acute metabolic encephalopathy, hypoglycemia, hypophosphatemia, hypokalemia, AKI, protein malnutrition.   PMT was consulted to support patient and family goals of care discussions.   Summary of counseling/coordination of care: Extensive chart review completed prior to meeting patient including labs, vital signs, imaging, progress notes, orders, and available advanced directive documents from current and previous encounters.   After reviewing the patient's chart and assessing the patient at bedside, I spoke with patient and family in regards to symptom management and goals of care.   Terry [name] is more interactive today. He is alert to self and his family members. He denies pain but when asked specifically about back pain, he endorses L sided back pain. He attempts to reposition several times during visit and noted to have  furrowed brow. Primary RN, Terry [name], acknowledges that patient appears uncomfortable and states she will administer morphine. Terry [name] daughter is in agreement with this plan.   Patient daughter Terry [name] and multiple family members at the bedside state they're awaiting hospice liaison to assess Terry [name] for hospice home. Terry [name] expresses concern that her father keeps asking for water and coffee but she is afraid to let him drink due to concern of aspiration. She was educated that he may have sips of drink with the expectation that he may be aspirating, but it is important to let him have what he wants for his comfort. Advised that he can also have thicker items such as pudding, ice cream or milkshake that are easier to swallow.   Therapeutic silence and active listening provided for Terry [name] to share her thoughts and emotions regarding current medical situation.  Emotional support provided. She states she is thankful for the help and information during this difficult time.   Discussed with [name], Primary RN, that patient has PRN medications for pain, shortness of breath, nausea and agitation if needed.   Reviewed MAR.  Symptoms controlled with current medication regimen. No adjustments recommended at this time.   PMT will continue to follow and support patient and family throughout his hospitalization.  Received update for Terry [name], [name] Hospice liaison, [name] not appropriate for [employer]. Discussions had with family regarding returning home with hospice services versus LTC with hospice following.    Physical Exam Vitals reviewed.  Constitutional:      General: He is not in acute distress.  Appearance: He is ill-appearing.  HENT:     Mouth/Throat:     Mouth: Mucous membranes are dry.  Pulmonary:     Effort: Pulmonary effort is normal. No respiratory distress.  Musculoskeletal:     Right lower leg: No edema.     Left lower leg: No edema.  Skin:    General: Skin is warm and dry.  Neurological:      Mental Status: He is alert.             Total Time 50 minutes   Time spent includes: Detailed review of medical records (labs, imaging, vital signs), medically appropriate exam (mental status, respiratory, cardiac, skin), discussed with treatment team, counseling and educating patient, family and staff, documenting clinical information, medication management and coordination of care.     Terry [name], Terry [name]- [facility] Palliative Medicine Team  02/20/2024 12:26 PM  Office [phone]  Pager [phone]

## 2024-02-20 NOTE — Progress Notes (Signed)
 [facility]  - [place] [facility]   PATIENT NAME: Terry [name]    MR#:  [id number]  DATE OF BIRTH:  1936-06-15  SUBJECTIVE:  patient laying comfortably. Thirsty morning water to drink. Daughter and other family at bedside. Discussed with family regarding food for pleasure. They understand risk and benefits.    VITALS:  Blood pressure 117/72, pulse 92, temperature 97.8 F (36.6 C), resp. rate 16, height 5\' 7"  (1.702 m), weight 49.9 kg, SpO2 98%.  PHYSICAL EXAMINATION:  limited. Comfort care GENERAL:  88 y.o.-year-old patient with no acute distress. Thin cachectic LUNGS: Normal breath sounds bilaterally CARDIOVASCULAR: S1, S2 normal.  NEUROLOGIC:  patient is awake  LABORATORY PANEL:  CBC Recent Labs  Lab 02/19/24 0625  WBC 5.9  HGB 10.0*  HCT 30.2*  PLT 92*    Chemistries  Recent Labs  Lab 02/18/24 0631 02/19/24 0625  NA 143 145  K 3.6 3.3*  CL 117* 116*  CO2 19* 22  GLUCOSE 94 88  BUN 23 23  CREATININE 1.25* 1.27*  CALCIUM 8.8* 8.8*  MG 2.1  --     Assessment and Plan   Comfort care only transitioned on 3/7 Palliative care help appreciated.  Due to poor prognosis, goals of care discussed with patient's daughter Terry [name] and she agreed to stop treatment and transition to comfort measures only.  -- Per Hospice liaison patient is not a candidate for hospice facility. Will have TOC discussed with family home with hospice versus LTC with hospice   COVID-19 virus infection: No oxygen desaturation.  Chest x-ray negative for infiltration.  No fever, clinically not septic. Continued supportive care S/p Bronchodilators and prn Robitussin for cough   UTI (urinary tract infection)  US Renal: No obstruction, bilateral renal calculi   Pancytopenia, most likely due to COVID viral infection Thrombocytopenia: Platelet 92--->36-->89   Bruise left lateral chest wall noticed on 3/2 Possible secondary to trauma versus thrombocytopenia CT chest: Left  moderate pleural effusion, no significant left lateral hematoma.  Ascites and renal calculi.   Acute metabolic encephalopathy: CT head negative, possibly due to UTI and COVID viral infection.   Hypoglycemia due to poor oral intake and dysphagia Hypophosphatemia, Phos repleted. Hypokalemia: Potassium repleted Coronary artery disease Hypercholesteremia Benign essential HTN: Patient not taking medications currently.   AKI (acute kidney injury) (HCC): Likely due to dehydration and UTI Protein-calorie malnutrition, severe: Body weight 49.9 kg, BMI 17.23 S/p Ensure and nutritionist consult    Family communication : daughter at bedside CODE STATUS: DNR comfort care DVT Prophylaxis : comfort care Status is: Inpatient Remains inpatient appropriate because: discharge planning.    TOTAL TIME TAKING CARE OF THIS PATIENT: 35 minutes.  >50% time spent on counselling and coordination of care  Note: This dictation was prepared with Dragon dictation along with smaller phrase technology. Any transcriptional errors that result from this process are unintentional.  Enedina Finner M.D    [facility]   CC: Primary care physician; Whitaker, [employer], PA-C

## 2024-02-20 NOTE — Progress Notes (Addendum)
 [facility] LIAISON NOTE   Received request from Ashby Dawes, RN, Transitions of Care Manager, for evaluation of hospice inpatient unit.   Spoke with Arvella Merles, patient's daughter with Pam's daughter present.  This RN initiated education related to hospice philosophy, services, and team approach to care. Patient/family verbalized understanding of information given.  At this time, patient does not meet criteria for IPU level of care.  Patient awake, alert.  Has had 1 dose of haldol last pm for agitation and 1 dose of morphine around 1225p today.  Family above were given opportunity to ask questions with answers given.    Plan to re-assess patient tomorrow.  Discussed hospice options outside of hospice IPU. [facility] information and contact numbers given to [facility], patient's daughter.  Above information shared with Noni Saupe, Transitions of Care Manager, Enedina Finner MD and PMT.   Please call with any hospice related questions or concerns. Thank you for the opportunity to participate in this patient's care.   Norris Cross, MA, BSN, RN, FNE Nurse Liaison [phone]

## 2024-02-21 DIAGNOSIS — E43 Unspecified severe protein-calorie malnutrition: Secondary | ICD-10-CM | POA: Diagnosis not present

## 2024-02-21 DIAGNOSIS — G9341 Metabolic encephalopathy: Secondary | ICD-10-CM | POA: Diagnosis not present

## 2024-02-21 DIAGNOSIS — Z515 Encounter for palliative care: Secondary | ICD-10-CM | POA: Diagnosis not present

## 2024-02-21 DIAGNOSIS — Z711 Person with feared health complaint in whom no diagnosis is made: Secondary | ICD-10-CM | POA: Diagnosis not present

## 2024-02-21 DIAGNOSIS — U071 COVID-19: Secondary | ICD-10-CM | POA: Diagnosis not present

## 2024-02-21 DIAGNOSIS — Z66 Do not resuscitate: Secondary | ICD-10-CM | POA: Diagnosis not present

## 2024-02-21 NOTE — Progress Notes (Signed)
 [facility]  -  at [facility]   PATIENT NAME: Terry [name]    MR#:  [id number]  DATE OF BIRTH:  Dec 25, 1935  SUBJECTIVE:  patient laying comfortably. No issues per RN. Seen earlier no family at bedside   VITALS:  Blood pressure 117/71, pulse 83, temperature 97.6 F (36.4 C), resp. rate 14, height 5\' 7"  (1.702 m), weight 49.9 kg, SpO2 96%.  PHYSICAL EXAMINATION:  limited. Comfort care GENERAL:  88 y.o.-year-old patient with no acute distress. Thin cachectic LUNGS: Normal breath sounds bilaterally CARDIOVASCULAR: S1, S2 normal.  NEUROLOGIC:  patient is awake  LABORATORY PANEL:  CBC Recent Labs  Lab 02/19/24 0625  WBC 5.9  HGB 10.0*  HCT 30.2*  PLT 92*    Chemistries  Recent Labs  Lab 02/18/24 0631 02/19/24 0625  NA 143 145  K 3.6 3.3*  CL 117* 116*  CO2 19* 22  GLUCOSE 94 88  BUN 23 23  CREATININE 1.25* 1.27*  CALCIUM 8.8* 8.8*  MG 2.1  --     Assessment and Plan   Comfort care only transitioned on 3/7 Palliative care help appreciated.  Due to poor prognosis, goals of care discussed with patient's daughter Terry [name] and she agreed to stop treatment and transition to comfort measures only.  -- Per Hospice liaison patient is not a candidate for hospice facility. Will have TOC discussed with family home with hospice versus LTC with hospice   COVID-19 virus infection: No oxygen desaturation.  Chest x-ray negative for infiltration.  No fever, clinically not septic. Continued supportive care S/p Bronchodilators and prn Robitussin for cough   UTI (urinary tract infection)  US Renal: No obstruction, bilateral renal calculi   Pancytopenia, most likely due to COVID viral infection Thrombocytopenia: Platelet 92--->36-->89   Bruise left lateral chest wall noticed on 3/2 Possible secondary to trauma versus thrombocytopenia CT chest: Left moderate pleural effusion, no significant left lateral hematoma.  Ascites and renal calculi.   Acute metabolic  encephalopathy: CT head negative, possibly due to UTI and COVID viral infection.   Hypoglycemia due to poor oral intake and dysphagia Hypophosphatemia, Phos repleted. Hypokalemia: Potassium repleted Coronary artery disease Hypercholesteremia Benign essential HTN: Patient not taking medications currently.   AKI (acute kidney injury) (HCC): Likely due to dehydration and UTI Protein-calorie malnutrition, severe: Body weight 49.9 kg, BMI 17.23 S/p Ensure and nutritionist consult    Family communication : none today CODE STATUS: DNR comfort care DVT Prophylaxis : comfort care Status is: Inpatient Remains inpatient appropriate because: discharge planning.    TOTAL TIME TAKING CARE OF THIS PATIENT: 25 minutes.  >50% time spent on counselling and coordination of care  Note: This dictation was prepared with Dragon dictation along with smaller phrase technology. Any transcriptional errors that result from this process are unintentional.  Enedina Finner M.D    [facility]   CC: Primary care physician; Whitaker, [employer], PA-C

## 2024-02-21 NOTE — Progress Notes (Signed)
 Pt's family at bedside asking for haldol for the patient. Pt is alert and mumbling. Med given per PRN order.

## 2024-02-21 NOTE — Progress Notes (Signed)
 Provided education about S/S of end of life, end of life care and ways pain are assessed during end of life. Pt's daughter verbalizes understanding.

## 2024-02-21 NOTE — Progress Notes (Signed)
 On assessment pt noted to have RR of 14, however he is having periods of apnea. Pt will not open mouth for mouth care, but appears in no distress and is resting quietly.

## 2024-02-21 NOTE — Progress Notes (Signed)
 Patient resting quietly in bed, eyes closed, breathing even, RR 14. Pt has a family member at bedside that states not to bother patient at this time. Educated family member to call if pt needs any assistance or feels that he may need pain or anxiety medication. Family Verbalizes understanding.

## 2024-02-21 NOTE — Progress Notes (Signed)
 Palliative Care Progress Note, Assessment & Plan   Patient Name: Terry [name]       Date: 02/21/2024 DOB: [birth year]  Age: 88 y.o. MRN#: [id number] Attending Physician: Terry Finner, MD Primary Care Physician: Terry Corner, PA-C Admit Date: 02/09/2024  Subjective: Pt lying upright in bed. He has no complaints but is requesting coffee. Pt daughter and sister at the bedside. Denies shortness of breath/nausea.   HPI: 88 y.o. male with past medical history of hypertension, hyperlipidemia, CAD, stent placement, diastolic CHF, bladder cancer, s/p of ileal conduit, bullous pemphigoid, kidney stone, CAD  admitted on 02/09/2024 with AMS.   Patient found to be COVID-positive.  He is being treated with bronchodilators and as needed Robitussin.  Additionally treated for UTI, pancytopenia, acute metabolic encephalopathy, hypoglycemia, hypophosphatemia, hypokalemia, AKI, protein malnutrition.   PMT was consulted to support patient and family goals of care discussions.   Summary of counseling/coordination of care: Extensive chart review completed prior to meeting patient including labs, vital signs, imaging, progress notes, orders, and available advanced directive documents from current and previous encounters.   After reviewing the patient's chart and assessing the patient at bedside, I spoke with patient/family in regards to symptom management and goals of care.   Terry [name] is alert and interactive today. He is alert to self and family members. He denies pain, shortness of breath or nausea. He appears comfortable. He is in no distress.  Patient's daughter at bedside concerned about her father's prognosis. She was educated that it is hard to estimate exact time of passing. Advised that assessments will  be performed frequently for changes in status. She voices concern over not being accepted to the inpatient hospice facility. Pam was educated that the hospice liaison will re-evaluate as needed. Pam questions status for visitors for her father. She was educated that he has a non-restricted visitor order that includes pets as well as long as they are restrained and have proof of vaccination status. She is thankful for this information and states she will bring her dog for a visit.   Therapeutic silence and active listening provided for daughter to share her thoughts and emotions regarding current medical situation.  Emotional support provided.  Reviewed MAR. Symptoms controlled with current medication regimen. No adjustments recommended at this time.   Physical Exam Vitals reviewed.  Constitutional:      General: He is not in acute distress.    Appearance: He is ill-appearing.     Comments: Cachetic   HENT:     Mouth/Throat:     Mouth: Mucous membranes are dry.  Pulmonary:     Effort: Pulmonary effort is normal. No respiratory distress.  Skin:    General: Skin is warm and dry.  Neurological:     Mental Status: He is alert.             Total Time 25 minutes   Time spent includes: Detailed review of medical records (labs, imaging, vital signs), medically appropriate exam (mental status, respiratory, cardiac, skin), discussed with treatment team, counseling and educating patient, family and staff, documenting clinical information, medication management and coordination of care.     Terry [name], Terry [name] [facility] Palliative Medicine Team  02/21/2024 3:22 PM  Office [phone]  Pager [phone]

## 2024-02-22 DIAGNOSIS — Z66 Do not resuscitate: Secondary | ICD-10-CM | POA: Diagnosis not present

## 2024-02-22 DIAGNOSIS — U071 COVID-19: Secondary | ICD-10-CM | POA: Diagnosis not present

## 2024-02-22 DIAGNOSIS — Z711 Person with feared health complaint in whom no diagnosis is made: Secondary | ICD-10-CM | POA: Diagnosis not present

## 2024-02-22 DIAGNOSIS — Z515 Encounter for palliative care: Secondary | ICD-10-CM | POA: Diagnosis not present

## 2024-02-22 NOTE — Progress Notes (Signed)
 [facility]  - [place] at [facility]   PATIENT NAME: Terry [name]    MR#:  [id number]  DATE OF BIRTH:  March 10, 1936  SUBJECTIVE:  patient laying comfortably. No issues per RN. Granddaughters at bedside. The questions were answered. Received dose of morphine earlier.  VITALS:  Blood pressure 116/66, pulse 77, temperature 97.7 F (36.5 C), temperature source Oral, resp. rate 16, height 5\' 7"  (1.702 m), weight 49.9 kg, SpO2 99%.  PHYSICAL EXAMINATION:  limited. Comfort care GENERAL:  88 y.o.-year-old patient with no acute distress. Thin cachectic LUNGS: Normal breath sounds bilaterally, not in respiratory distress    LABORATORY PANEL:  CBC Recent Labs  Lab 02/19/24 0625  WBC 5.9  HGB 10.0*  HCT 30.2*  PLT 92*    Chemistries  Recent Labs  Lab 02/18/24 0631 02/19/24 0625  NA 143 145  K 3.6 3.3*  CL 117* 116*  CO2 19* 22  GLUCOSE 94 88  BUN 23 23  CREATININE 1.25* 1.27*  CALCIUM 8.8* 8.8*  MG 2.1  --     Assessment and Plan   Comfort care only transitioned on 3/7 Palliative care help appreciated.  Due to poor prognosis, goals of care discussed with patient's daughter Terry [name] and she agreed to stop treatment and transition to comfort measures only.  -- Per Hospice liaison patient is not a candidate for hospice facility. Will have TOC discussed with family home with hospice versus LTC with hospice   COVID-19 virus infection: No oxygen desaturation.  Chest x-ray negative for infiltration.  No fever, clinically not septic. Continued supportive care S/p Bronchodilators and prn Robitussin for cough   UTI (urinary tract infection)  US Renal: No obstruction, bilateral renal calculi   Pancytopenia, most likely due to COVID viral infection Thrombocytopenia: Platelet 92--->36-->89   Bruise left lateral chest wall noticed on 3/2 Possible secondary to trauma versus thrombocytopenia CT chest: Left moderate pleural effusion, no significant left lateral  hematoma.  Ascites and renal calculi.   Acute metabolic encephalopathy: CT head negative, possibly due to UTI and COVID viral infection.   Hypoglycemia due to poor oral intake and dysphagia Hypophosphatemia, Phos repleted. Hypokalemia: Potassium repleted Coronary artery disease Hypercholesteremia Benign essential HTN: Patient not taking medications currently.   AKI (acute kidney injury) (HCC): Likely due to dehydration and UTI Protein-calorie malnutrition, severe: Body weight 49.9 kg, BMI 17.23 S/p Ensure and nutritionist consult    Family communication : none today CODE STATUS: DNR comfort care DVT Prophylaxis : comfort care Status is: Inpatient Remains inpatient appropriate because: discharge planning.    TOTAL TIME TAKING CARE OF THIS PATIENT: 25 minutes.  >50% time spent on counselling and coordination of care  Note: This dictation was prepared with Dragon dictation along with smaller phrase technology. Any transcriptional errors that result from this process are unintentional.  Enedina Finner M.D    [facility]   CC: Primary care physician; Whitaker, [employer], PA-C

## 2024-02-22 NOTE — Consult Note (Signed)
 [facility] Liaison Note  02/22/2024  Terry [name] Jan 11, 1936 [id number]  Location: RN [name] Liaison met patient at bedside at [name]  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 The patient was screened for 30 day readmission hospitalization with noted high risk score for unplanned readmission risk with 3 IP/1 ED in 6 months.  The patient was assessed for potential Care Management service needs for post [name] transition for care coordination. Review of patient's electronic medical record reveals patient was admitted for COVID-19. Pt recommended for SNF on 3/3 and evaluated by [facility] for possible hospice services if eligible.   Plan: [facility] Liaison will continue to follow progress and disposition to asess for post [name] community care coordination/management needs.  Referral request for community care coordination: pending disposition.   [facility] Care Management/Population Health does not replace or interfere with any arrangements made by the Inpatient Transition of Care team.   For questions contact:   Terry Cousin, RN, BSN [name] Liaison Terry [name]   [name], Population Health Office Hours MTWF  8:00 am-6:00 pm Direct Dial: [phone] mobile [name]

## 2024-02-22 NOTE — Progress Notes (Addendum)
 [facility] Liaison Note  HL team will follow patient peripherally through discharge disposition.  Hospice can follow patient if he goes to a LTC facility.    Please call with any Hospice related questions or concerns  Thank you for the opportunity to participate in this patient's care  [facility] Liaison [phone]

## 2024-02-22 NOTE — Progress Notes (Signed)
 Patient was transferred from 1A to 1C - 123A.  Patient is noted to have a port in upper right chest. Port is currently not accessed.

## 2024-02-23 DIAGNOSIS — U071 COVID-19: Secondary | ICD-10-CM | POA: Diagnosis not present

## 2024-02-23 DIAGNOSIS — N390 Urinary tract infection, site not specified: Secondary | ICD-10-CM | POA: Diagnosis not present

## 2024-02-23 DIAGNOSIS — R4182 Altered mental status, unspecified: Secondary | ICD-10-CM | POA: Diagnosis not present

## 2024-02-23 DIAGNOSIS — Z515 Encounter for palliative care: Secondary | ICD-10-CM | POA: Diagnosis not present

## 2024-02-23 NOTE — Plan of Care (Signed)
 Patient has been non-verbal this shift except for mumbled speech that was incoherent. Sleeping all day. Eyes opened several times spontaneously. No oral intake this shift. Family has been at bedside. Needs anticipated and met by staff.   Problem: Education: Goal: Knowledge of risk factors and measures for prevention of condition will improve Outcome: Not Progressing   Problem: Coping: Goal: Psychosocial and spiritual needs will be supported Outcome: Not Progressing   Problem: Respiratory: Goal: Complications related to the disease process, condition or treatment will be avoided or minimized Outcome: Not Progressing   Problem: Education: Goal: Knowledge of General Education information will improve Description: Including pain rating scale, medication(s)/side effects and non-pharmacologic comfort measures Outcome: Not Progressing   Problem: Clinical Measurements: Goal: Ability to maintain clinical measurements within normal limits will improve Outcome: Not Progressing   Problem: Clinical Measurements: Goal: Respiratory complications will improve Outcome: Not Progressing   Problem: Clinical Measurements: Goal: Cardiovascular complication will be avoided Outcome: Not Progressing   Problem: Activity: Goal: Risk for activity intolerance will decrease Outcome: Not Progressing   Problem: Nutrition: Goal: Adequate nutrition will be maintained Outcome: Not Progressing   Problem: Coping: Goal: Level of anxiety will decrease Outcome: Not Progressing   Problem: Elimination: Goal: Will not experience complications related to bowel motility Outcome: Not Progressing   Problem: Skin Integrity: Goal: Risk for impaired skin integrity will decrease Outcome: Not Progressing    Problem: Role Relationship: Goal: Ability to verbalize concerns, feelings, and thoughts to partner or family member will improve Outcome: Not Progressing   Problem: Pain Management: Goal: Satisfaction with  pain management regimen will improve Outcome: Not Progressing   Problem: Role Relationship: Goal: Family's ability to cope with current situation will improve Outcome: Not Progressing

## 2024-02-23 NOTE — Progress Notes (Signed)
 [facility] Hospice Liaison Note  Hospital Liaison will re-evaluate patient for the Hospice Home tomorrow morning.  Please call with any Hospice related questions or concerns.  Thank you for the opportunity to participate in this patient's care  [facility] Liaison [phone].

## 2024-02-23 NOTE — Progress Notes (Signed)
                                                     Palliative Care Progress Note, Assessment & Plan   Patient Name: Terry [name] [facility]       Date: 02/23/2024 DOB: 01-05-1936  Age: 88 y.o. MRN#: [id number] Attending Physician: Kathrynn Running, MD Primary Care Physician: Wilford Corner, PA-C Admit Date: 02/09/2024  Subjective: Patient is lying in bed and leaning to his right side.  He does not awaken to my presence.  He makes no vocalizations during my visit.  His daughter is at bedside.  HPI: 88 y.o. male with past medical history of hypertension, hyperlipidemia, CAD, stent placement, diastolic CHF, bladder cancer, s/p of ileal conduit, bullous pemphigoid, kidney stone, CAD  admitted on 02/09/2024 with AMS.   Patient found to be COVID-positive.  He is being treated with bronchodilators and as needed Robitussin.  Additionally treated for UTI, pancytopenia, acute metabolic encephalopathy, hypoglycemia, hypophosphatemia, hypokalemia, AKI, protein malnutrition.   PMT was consulted to support patient and family goals of care discussions.   Summary of counseling/coordination of care: Extensive chart review completed prior to meeting patient including labs, vital signs, imaging, progress notes, orders, and available advanced directive documents from current and previous encounters.   After reviewing the patient's chart and assessing the patient at bedside, I spoke with patient's family in regards to symptom management and goals of care.   Full comfort measures to continue.  Discussed that current regimen appears to be appropriately managing patient's symptoms.  Discussed end-of-life/terminal agitation, agonal breathing, and signs and symptoms of impending death.  Discussed that patient appears to be transitioning to EOL at this time.  No change  tomorrow with plan of care for today.  Discussed potential that if patient remains stable in the future that he could be evaluated to transfer out of hospital to either hospice home or to home.  Family shares they wish for patient to remain in the hospital.  Discussed taking it one day at a time.  Full comfort measures to continue.  PMT will continue to follow and support patient and family throughout his hospitalization.   Physical Exam Vitals reviewed.  Constitutional:      General: He is not in acute distress.    Appearance: He is not ill-appearing, toxic-appearing or diaphoretic.  HENT:     Head:     Comments: Temporal wasting    Mouth/Throat:     Mouth: Mucous membranes are moist.  Cardiovascular:     Rate and Rhythm: Normal rate.  Pulmonary:     Comments: Periods of apnea Skin:    General: Skin is warm and dry.  Neurological:     Comments: Nonverbal, does not open eyes on command             Total Time 35 minutes   Time spent includes: Detailed review of medical records (labs, imaging, vital signs), medically appropriate exam (mental status, respiratory, cardiac, skin), discussed with treatment team, counseling and educating patient, family and staff, documenting clinical information, medication management and coordination of care.  Samara Deist L. Bonita Quin, DNP, FNP-BC Palliative Medicine Team

## 2024-02-23 NOTE — Progress Notes (Signed)
 [facility]  - [place] at [facility]   PATIENT NAME: Terry [name]    MR#:  [id number]  DATE OF BIRTH:  1936/06/04  SUBJECTIVE:  asleep  VITALS:  Blood pressure 111/72, pulse 85, temperature (!) 97.4 F (36.3 C), resp. rate 16, height 5\' 7"  (1.702 m), weight 49.9 kg, SpO2 100%.  PHYSICAL EXAMINATION:  limited. Comfort care GENERAL:  88 y.o.-year-old patient with no acute distress. Thin cachectic LUNGS: Normal breath sounds bilaterally, not in respiratory distress    LABORATORY PANEL:  CBC Recent Labs  Lab 02/19/24 0625  WBC 5.9  HGB 10.0*  HCT 30.2*  PLT 92*    Chemistries  Recent Labs  Lab 02/18/24 0631 02/19/24 0625  NA 143 145  K 3.6 3.3*  CL 117* 116*  CO2 19* 22  GLUCOSE 94 88  BUN 23 23  CREATININE 1.25* 1.27*  CALCIUM 8.8* 8.8*  MG 2.1  --     Assessment and Plan   Comfort care only transitioned on 3/7 Palliative care help appreciated.  Due to poor prognosis, goals of care discussed with patient's daughter Terry [name] and she agreed to stop treatment and transition to comfort measures only.  -- Per Hospice liaison patient is not a candidate for hospice facility. Will have TOC discussed with family home with hospice versus LTC with hospice. Decline today, hasn't eaten or drank, will ask hospice home to re-eval tomorrow if persists like this   COVID-19 virus infection: No oxygen desaturation.  Chest x-ray negative for infiltration.  No fever, clinically not septic. Continued supportive care S/p Bronchodilators and prn Robitussin for cough   UTI (urinary tract infection)  US Renal: No obstruction, bilateral renal calculi   Pancytopenia, most likely due to COVID viral infection Thrombocytopenia: Platelet 92--->36-->89   Bruise left lateral chest wall noticed on 3/2 Possible secondary to trauma versus thrombocytopenia CT chest: Left moderate pleural effusion, no significant left lateral hematoma.  Ascites and renal calculi.   Acute  metabolic encephalopathy: CT head negative, possibly due to UTI and COVID viral infection.   Hypoglycemia due to poor oral intake and dysphagia Hypophosphatemia, Phos repleted. Hypokalemia: Potassium repleted Coronary artery disease Hypercholesteremia Benign essential HTN: Patient not taking medications currently.   AKI (acute kidney injury) (HCC): Likely due to dehydration and UTI Protein-calorie malnutrition, severe: Body weight 49.9 kg, BMI 17.23 S/p Ensure and nutritionist consult    Family communication : daughter and granddaughters at bedside today CODE STATUS: DNR comfort care DVT Prophylaxis : comfort care Status is: Inpatient Remains inpatient appropriate because: discharge planning.     Silvano Bilis M.D    [facility]

## 2024-02-24 DIAGNOSIS — U071 COVID-19: Secondary | ICD-10-CM | POA: Diagnosis not present

## 2024-02-24 DIAGNOSIS — Z515 Encounter for palliative care: Secondary | ICD-10-CM | POA: Diagnosis not present

## 2024-02-24 DIAGNOSIS — R4182 Altered mental status, unspecified: Secondary | ICD-10-CM | POA: Diagnosis not present

## 2024-02-24 DIAGNOSIS — N390 Urinary tract infection, site not specified: Secondary | ICD-10-CM | POA: Diagnosis not present

## 2024-02-24 NOTE — TOC Transition Note (Signed)
 Transition of Care [facility]) - Discharge Note   Patient Details  Name: Terry [name] MRN: [id number] Date of Birth: 1936-08-17  Transition of Care [facility]) CM/SW Contact:  Garret Reddish, RN Phone Number: 02/24/2024, 2:53 PM   Clinical Narrative:    Chart reviewed.  Noted that patient will be a discharge today to [facility].    [facility] has a bed for patient today.    [facility] will arranged for EMS transport to the Hospice home today.       Final next level of care: Hospice Medical Facility [facility]) Barriers to Discharge: No Barriers Identified   Patient Goals and CMS Choice   CMS Medicare.gov Compare Post Acute Care list provided to::  (Patient's family  ( Patient's daughters))        Discharge Placement                Patient to be transferred to facility by: Per [facility] Name of family member notified: Per [facility] Patient and family notified of of transfer: 02/24/24  Discharge Plan and Services Additional resources added to the After Visit Summary for                                       Social Drivers of Health (SDOH) Interventions SDOH Screenings   Food Insecurity: No Food Insecurity (02/09/2024)  Housing: Low Risk  (02/09/2024)  Transportation Needs: No Transportation Needs (02/09/2024)  Utilities: Not At Risk (02/09/2024)  Alcohol Screen: Low Risk  (08/26/2019)  Depression (PHQ2-9): Low Risk  (02/01/2020)  Financial Resource Strain: Low Risk  (08/27/2023)   Received from [name]  Physical Activity: Inactive (02/01/2020)  Social Connections: Socially Isolated (02/09/2024)  Stress: No Stress Concern Present (02/01/2020)  Tobacco Use: Medium Risk (02/09/2024)     Readmission Risk Interventions     No data to display

## 2024-02-24 NOTE — Discharge Summary (Signed)
 [facility] UJW:[id number] DOB: 10/13/1936 DOA: 02/09/2024  PCP: Wilford Corner, PA-C  Admit date: 02/09/2024 Discharge date: 02/24/2024  Time spent: 35 minutes     Discharge Diagnoses:  Principal Problem:   COVID-19 virus infection Active Problems:   UTI (urinary tract infection)   Acute metabolic encephalopathy   Hypoglycemia   Hypokalemia   Hypophosphatemia   Coronary artery disease   Hypercholesteremia   Benign essential HTN   AKI (acute kidney injury) (HCC)   Thrombocytopenia (HCC)   Protein-calorie malnutrition, severe (HCC)   Abnormal EKG   Leukopenia   Discharge Condition: poor but stable  Diet recommendation: ad lib  Filed Weights   02/09/24 1929 02/09/24 2250  Weight: 52.2 kg 49.9 kg    History of present illness:  From admission h and p Terry [name] is a 88 y.o. male with medical history significant of hypertension, hyperlipidemia, CAD, stent placement, diastolic CHF, bladder cancer, s/p of ileal conduit, bullous pemphigoid, kidney stone, CAD, who presents with altered mental status.   Patient has AMS, and is unable to provide accurate medical history, therefore, most of the history is obtained by discussing the case with ED physician, per EMS report, and with the nursing staff.    Per report, patient has been confused in the past several days.  EMS reported that the patient has hypoglycemia with blood glucose initially of 63 and increased with oral glucose to 101.  Patient is reportedly had urinary tract infection since January and has been given antibiotic treatment.  Patient has a generalized weakness, poor appetite, and decreased oral intake.   When I saw patient on the floor, patient has mild dry cough, no respiratory distress, no active nausea, vomiting, diarrhea noted.  Does not seem to have abdominal pain or chest pain.  Patient is confused, knows his own name, not orientated to the place and time.  He moves all extremities.  No  facial droop or slurred speech.  Hospital Course:  Comfort care only transitioned on 3/7 Palliative care help appreciated.  Due to poor prognosis, goals of care discussed with patient's daughter Terry [name] and she agreed to stop treatment and transition to comfort measures only. Initially inpatient hospice declined, but patient's status declined and currently not eating or drinking, agitated requiring prn haldol, re-assessed by hospice home and now accepted, discharging there   COVID-19 virus infection: No oxygen desaturation.  Chest x-ray negative for infiltration.  No fever, clinically not septic.    UTI (urinary tract infection)   Pancytopenia, most likely due to COVID viral infection Thrombocytopenia: Platelet 92--->36-->89   Acute metabolic encephalopathy: CT head negative, possibly due to UTI and COVID viral infection.   Hypoglycemia due to poor oral intake and dysphagia Hypophosphatemia, Hypokalemia: Coronary artery disease Hypercholesteremia Benign essential HTN:    AKI (acute kidney injury) (HCC):  Protein-calorie malnutrition, severe:   Procedures: none   Consultations: Heme/onc, palliative  Discharge Exam: Vitals:   02/23/24 0834 02/24/24 0752  BP: 111/72 107/61  Pulse: 85 (!) 103  Resp: 16 11  Temp:  (!) 97.5 F (36.4 C)  SpO2: 100% 97%    General: NAD, sleeping Cardiovascular: extremities warm Respiratory: normal wob  Discharge Instructions   Discharge Instructions     Diet general   Complete by: As directed    No wound care   Complete by: As directed       Allergies as of 02/24/2024       Reactions   Triprolidine-pseudoephedrine Rash, Hives  Triprolidine-pseudoephedrine Hives, Rash   Dapsone Other (See Comments)   Methemoglobinemia   Other reaction(s): Unknown  Methemoglobinemia  Methemoglobinemia   Methemoglobinemia   Other reaction(s): Unknown  Methemoglobinemia   Sudafed Pe Cold-cough  [phenylephrine-dm-gg-apap] Hives   Actifed  Cold-allergy  [chlorpheniramine-phenylephrine] Rash        Medication List     STOP taking these medications    acetaminophen 325 MG tablet Commonly known as: TYLENOL   CAVILON NO STING BARRIER FILM EX   cetirizine 10 MG tablet Commonly known as: ZYRTEC   diphenhydramine-acetaminophen 25-500 MG Tabs tablet Commonly known as: TYLENOL PM   feeding supplement Liqd   folic acid 1 MG tablet Commonly known as: FOLVITE   losartan 25 MG tablet Commonly known as: COZAAR   methotrexate 2.5 MG tablet Commonly known as: RHEUMATREX   metoprolol succinate 25 MG 24 hr tablet Commonly known as: TOPROL-XL   neomycin-bacitracin-polymyxin [phone] ointment   nitroGLYCERIN 0.4 MG SL tablet Commonly known as: NITROSTAT   ondansetron 4 MG tablet Commonly known as: Zofran   rosuvastatin 20 MG tablet Commonly known as: CRESTOR   SenSura Drainable Pouch Misc   ticagrelor 90 MG Tabs tablet Commonly known as: Brilinta   VITAMIN D PO       Allergies  Allergen Reactions   Triprolidine-Pseudoephedrine Rash and Hives   Triprolidine-Pseudoephedrine Hives and Rash   Dapsone Other (See Comments)    Methemoglobinemia   Other reaction(s): Unknown  Methemoglobinemia   Methemoglobinemia   Methemoglobinemia   Other reaction(s): Unknown  Methemoglobinemia   Sudafed Pe Cold-Cough  [Phenylephrine-Dm-Gg-Apap] Hives   Actifed Cold-Allergy  [Chlorpheniramine-Phenylephrine] Rash    Contact information for after-discharge care     Destination     [facility] Preferred SNF .   Service: Skilled Nursing Contact information: [address] [place] [address] [phone]                      The results of significant diagnostics from this hospitalization (including imaging, microbiology, ancillary and laboratory) are listed below for reference.    Significant Diagnostic Studies: US RENAL Result Date: 02/15/2024 CLINICAL DATA:   Hematuria EXAM: RENAL / URINARY TRACT ULTRASOUND COMPLETE COMPARISON:  Renal ultrasound 2016 FINDINGS: Right Kidney: Renal measurements: 9.5 x 4.6 x 4.7 cm = volume: 108 mL. Echogenicity within normal limits. No mass or hydronephrosis visualized. There is a echogenic shadowing area along the lower pole of the right kidney consistent with a potential stone measuring 16 mm. Left Kidney: Renal measurements: 9.4 x 4.2 x 4.0 cm = volume: 82 mL. Mild atrophy. Slight collecting system ectasia. Shadowing stones as well left kidney measuring up to 16 mm. Bladder: Poorly seen.  This could be contracted. Other: Ascites identified. IMPRESSION: Mild left-sided renal collecting system dilatation of uncertain etiology. The bladder is also poorly seen and could be contracted. Bilateral renal stones are seen. Further workup with CT as clinically appropriate. Ascites. Electronically Signed   By: Karen Kays M.D.   On: 02/15/2024 14:44   CT CHEST W CONTRAST Result Date: 02/14/2024 CLINICAL DATA:  Trauma possible hematoma bruising to the left lower chest wall EXAM: CT CHEST WITH CONTRAST TECHNIQUE: Multidetector CT imaging of the chest was performed during intravenous contrast administration. RADIATION DOSE REDUCTION: This exam was performed according to the departmental dose-optimization program which includes automated exposure control, adjustment of the mA and/or kV according to patient size and/or use of iterative reconstruction technique. CONTRAST:  75mL OMNIPAQUE  IOHEXOL 300 MG/ML  SOLN COMPARISON:  Chest x-ray 02/09/2024, 01/08/2024 FINDINGS: Cardiovascular: Mild aortic atherosclerosis. No aneurysm. Right-sided central venous port with tip at the cavoatrial region. Normal cardiac size. Coronary vascular calcification. No pericardial effusion. Coronary stent. Mediastinum/Nodes: Patent trachea. No thyroid mass. Fall borderline AP window node measuring 10 mm. Right hilar node measuring 14 mm. Esophagus within normal limits  Lungs/Pleura: Advanced emphysema. Nodular pleuroparenchymal scarring at the apices. Small right and moderate left pleural effusions. Dependent ground-glass density in the right lower lobe. Partial left lower lobe consolidation. Upper Abdomen: Incompletely visualized large volume ascites in the upper abdomen. Distended gallbladder without calcified stone. Partially visualized atrophic left kidney. Bilateral kidney stones. Prominent gastric mucosal enhancement. Musculoskeletal: No acute displaced rib fracture. No sizable chest wall hematoma. Degenerative changes of the spine IMPRESSION: 1. No acute displaced rib fracture. No significant left chest wall hematoma 2. Advanced emphysema with irregular scarring at the apices. Moderate left and small right pleural effusions. Partial left lower lobe consolidation, atelectasis versus pneumonia. 3. Mildly enlarged right hilar and AP window lymph node, nonspecific, possibly reactive but metastatic nodes not excluded. 4. Large volume incompletely visualized upper abdominal ascites 5. Distended gallbladder without calcified stone 6. Bilateral kidney stones. Cortical scarring and atrophy of the visualized left kidney 7. Prominent gastric mucosal enhancement, correlate for gastritis Aortic Atherosclerosis (ICD10-I70.0) and Emphysema (ICD10-J43.9). Electronically Signed   By: Jasmine Pang M.D.   On: 02/14/2024 17:24   DG Chest Port 1 View Result Date: 02/09/2024 CLINICAL DATA:  Short of breath, weakness, confusion EXAM: PORTABLE CHEST 1 VIEW COMPARISON:  01/08/2024 FINDINGS: Single frontal view of the chest demonstrates a stable right chest wall port. The cardiac silhouette is unremarkable. Stable areas of scarring compatible with emphysema. No acute airspace disease, effusion, or pneumothorax. No acute bony abnormalities. IMPRESSION: 1. Findings consistent with background emphysema, stable. No acute airspace disease. Electronically Signed   By: Sharlet Salina M.D.   On:  02/09/2024 19:54   CT Head Wo Contrast Result Date: 02/09/2024 CLINICAL DATA:  Mental status change of unknown cause. Hypoglycemia. EXAM: CT HEAD WITHOUT CONTRAST TECHNIQUE: Contiguous axial images were obtained from the base of the skull through the vertex without intravenous contrast. RADIATION DOSE REDUCTION: This exam was performed according to the departmental dose-optimization program which includes automated exposure control, adjustment of the mA and/or kV according to patient size and/or use of iterative reconstruction technique. COMPARISON:  None Available. FINDINGS: Brain: Diffuse cerebral atrophy. Ventricular dilatation consistent with central atrophy. Low-attenuation changes in the deep white matter consistent with small vessel ischemia. No abnormal extra-axial fluid collections. No mass effect or midline shift. Gray-white matter junctions are distinct. Basal cisterns are not effaced. No acute intracranial hemorrhage. Vascular: No hyperdense vessel or unexpected calcification. Skull: Normal. Negative for fracture or focal lesion. Sinuses/Orbits: Mucosal thickening in the paranasal sinuses. No acute air-fluid levels. Mastoid air cells are clear. Other: None. IMPRESSION: No acute intracranial abnormalities. Chronic atrophy and small vessel ischemic changes. Electronically Signed   By: Burman Nieves M.D.   On: 02/09/2024 19:21    Microbiology: Recent Results (from the past 240 hours)  Urine Culture     Status: Abnormal   Collection Time: 02/15/24 10:14 AM   Specimen: Urine, Random  Result Value Ref Range Status   Specimen Description   Final    URINE, RANDOM Performed at [facility], [address], [place], [place] [address]    Special Requests   Final    NONE Reflexed from  [id number] Performed at [facility], [address], [place], [place] [address]    Culture (A)  Final    <10,000 COLONIES/mL INSIGNIFICANT GROWTH Performed at [facility], [address] [place], [place] [address]    Report Status 02/16/2024 FINAL  Final     Labs: Basic Metabolic Panel: Recent Labs  Lab 02/18/24 0631 02/19/24 0625  NA 143 145  K 3.6 3.3*  CL 117* 116*  CO2 19* 22  GLUCOSE 94 88  BUN 23 23  CREATININE 1.25* 1.27*  CALCIUM 8.8* 8.8*  MG 2.1  --   PHOS 3.1  --    Liver Function Tests: No results for input(s): "AST", "ALT", "ALKPHOS", "BILITOT", "PROT", "ALBUMIN" in the last 168 hours. No results for input(s): "LIPASE", "AMYLASE" in the last 168 hours. No results for input(s): "AMMONIA" in the last 168 hours. CBC: Recent Labs  Lab 02/18/24 0631 02/19/24 0625  WBC 7.7 5.9  NEUTROABS 6.5 4.5  HGB 10.8* 10.0*  HCT 32.4* 30.2*  MCV 93.9 96.2  PLT 89* 92*   Cardiac Enzymes: No results for input(s): "CKTOTAL", "CKMB", "CKMBINDEX", "TROPONINI" in the last 168 hours. BNP: BNP (last 3 results) Recent Labs    01/08/24 2250  BNP 123.8*    ProBNP (last 3 results) No results for input(s): "PROBNP" in the last 8760 hours.  CBG: Recent Labs  Lab 02/19/24 0803 02/19/24 0906 02/19/24 1010 02/19/24 1219 02/19/24 1613  GLUCAP 69* 71 95 100* 87       Signed:  Silvano Bilis MD.  [facility] 02/24/2024, 1:51 PM

## 2024-02-24 NOTE — Progress Notes (Signed)
 EMS transported pt to [facility] at 2307. PRN comfort care meds given at this time. Family at bedside.

## 2024-02-24 NOTE — Progress Notes (Signed)
 Pt report called and all questions answered  from Hospice Home to [place].

## 2024-02-24 NOTE — Progress Notes (Addendum)
 [facility] Liaison note  Patient approved for admission to the Hospice Home today.  Family agreeable to bed offer.  Patient will transfer over to the Hospice Home today via EMS after consents are signed.    Please call with any Hospice related questions or concerns.    Thank you for the opportunity to participate in this patient's care  [facility] liaison [phone]

## 2024-02-24 NOTE — Progress Notes (Signed)
 Palliative Care Progress Note, Assessment & Plan   Patient Name: Terry [name] [facility]       Date: 02/24/2024 DOB: Sep 17, 1936  Age: 88 y.o. MRN#: [id number] Attending Physician: Kathrynn Running, MD Primary Care Physician: Wilford Corner, PA-C Admit Date: 02/09/2024  Subjective: Patient is lying in bed in no apparent distress.  He does not awaken to my presence.  He does not open his eyes to meet my gaze.  During my visit, he moaning and groaning and without making any clear vocalizations.  His daughter, and granddaughters are at bedside during my visit.  HPI: 88 y.o. male with past medical history of hypertension, hyperlipidemia, CAD, stent placement, diastolic CHF, bladder cancer, s/p of ileal conduit, bullous pemphigoid, kidney stone, CAD  admitted on 02/09/2024 with AMS.   Patient found to be COVID-positive.  He is being treated with bronchodilators and as needed Robitussin.  Additionally treated for UTI, pancytopenia, acute metabolic encephalopathy, hypoglycemia, hypophosphatemia, hypokalemia, AKI, protein malnutrition.   PMT was consulted to support patient and family goals of care discussions.  Summary of counseling/coordination of care: Extensive chart review completed prior to meeting patient including labs, vital signs, imaging, progress notes, orders, and available advanced directive documents from current and previous encounters.   After reviewing the patient's chart and assessing the patient at bedside, I spoke with patient's family in regards to symptom management and goals of care.   Family endorses that patient has increased agitation since yesterday.  They share he has moments of moaning, groaning, grimacing, trying to readjust himself in the bed.  They share that to be needed pain  medication as appropriately managing his discomfort.  However, it is becoming increasingly more frequent.  Discussed terminal agitation and counseled family on use of various modalities to address terminal agitation as well as pain and discomfort.  Counseled with dayshift RN.  Advised to give 1 dose of Haldol now for agitation.  Dose given during my presence.  For the entirety of the rest of my visit, patient was much more calm, not moaning or groaning, and appeared more comfortable.  I discussed next steps in discharge potential with family.  Discussed that hospice has evaluated patient and that he is appropriate to go to the hospice home.  Family shares they want what is best for the patient.  Recommended that we utilize the hospice home at this point since there appears to be a window of time that patient could safely travel and remains stable.  Family in agreement.  After visiting with the patient and family, I secure chatted with attending, RN, and TOC.  Family has accepted bed at hospice home.  Discharge summary and transfer plan pending.  No adjustment to [facility] needed at this time.  Full comfort measures continue.  Physical Exam Vitals reviewed.  Constitutional:      General: He is not in acute distress.    Appearance: He is ill-appearing.  HENT:     Head:     Comments: Temporal wasting    Mouth/Throat:     Mouth: Mucous membranes are dry.  Cardiovascular:     Rate and Rhythm: Normal rate.     Pulses: Normal pulses.  Pulmonary:  Breath sounds: No wheezing or rhonchi.  Abdominal:     Palpations: Abdomen is soft.  Skin:    General: Skin is warm and dry.     Coloration: Skin is pale.  Neurological:     Comments: Moaning/groaning              Total Time 50 minutes   Time spent includes: Detailed review of medical records (labs, imaging, vital signs), medically appropriate exam (mental status, respiratory, cardiac, skin), discussed with treatment team, counseling and educating  patient, family and staff, documenting clinical information, medication management and coordination of care.  Samara Deist L. Bonita Quin, DNP, FNP-BC Palliative Medicine Team

## 2024-03-04 ENCOUNTER — Ambulatory Visit: Payer: Medicare HMO | Admitting: Physician Assistant

## 2024-03-15 DEATH — deceased
